# Patient Record
Sex: Male | Born: 1935
Health system: Southern US, Community
[De-identification: ages and names within clinical notes are randomized; demographics above are authoritative.]

## PROBLEM LIST (undated history)

## (undated) DIAGNOSIS — E079 Disorder of thyroid, unspecified: Secondary | ICD-10-CM

## (undated) DIAGNOSIS — I10 Essential (primary) hypertension: Secondary | ICD-10-CM

## (undated) DIAGNOSIS — G629 Polyneuropathy, unspecified: Secondary | ICD-10-CM

## (undated) DIAGNOSIS — M810 Age-related osteoporosis without current pathological fracture: Secondary | ICD-10-CM

## (undated) DIAGNOSIS — N189 Chronic kidney disease, unspecified: Secondary | ICD-10-CM

## (undated) DIAGNOSIS — R001 Bradycardia, unspecified: Secondary | ICD-10-CM

## (undated) DIAGNOSIS — K219 Gastro-esophageal reflux disease without esophagitis: Secondary | ICD-10-CM

## (undated) DIAGNOSIS — E785 Hyperlipidemia, unspecified: Secondary | ICD-10-CM

## (undated) HISTORY — PX: PARATHYROIDECTOMY: SHX19

## (undated) HISTORY — DX: Hyperlipidemia, unspecified: E78.5

## (undated) HISTORY — DX: Age-related osteoporosis without current pathological fracture: M81.0

## (undated) HISTORY — DX: Essential (primary) hypertension: I10

## (undated) HISTORY — DX: Bradycardia, unspecified: R00.1

## (undated) HISTORY — DX: Chronic kidney disease, unspecified: N18.9

## (undated) HISTORY — DX: Polyneuropathy, unspecified: G62.9

## (undated) HISTORY — DX: Disorder of thyroid, unspecified: E07.9

## (undated) HISTORY — DX: Gastro-esophageal reflux disease without esophagitis: K21.9

## (undated) HISTORY — PX: HERNIA REPAIR: SHX51

---

## 2007-03-08 ENCOUNTER — Ambulatory Visit: Payer: Self-pay | Admitting: Gastroenterology

## 2007-04-20 ENCOUNTER — Ambulatory Visit: Payer: Self-pay | Admitting: Gastroenterology

## 2008-08-16 ENCOUNTER — Emergency Department: Payer: Self-pay

## 2012-02-16 DIAGNOSIS — N183 Chronic kidney disease, stage 3 unspecified: Secondary | ICD-10-CM | POA: Insufficient documentation

## 2012-02-16 DIAGNOSIS — Z125 Encounter for screening for malignant neoplasm of prostate: Secondary | ICD-10-CM | POA: Diagnosis not present

## 2012-02-16 DIAGNOSIS — E785 Hyperlipidemia, unspecified: Secondary | ICD-10-CM | POA: Diagnosis not present

## 2012-02-16 DIAGNOSIS — K219 Gastro-esophageal reflux disease without esophagitis: Secondary | ICD-10-CM | POA: Diagnosis not present

## 2012-02-16 DIAGNOSIS — Z0000 Encounter for general adult medical examination without abnormal findings: Secondary | ICD-10-CM | POA: Diagnosis not present

## 2012-02-26 DIAGNOSIS — R55 Syncope and collapse: Secondary | ICD-10-CM | POA: Diagnosis not present

## 2012-02-26 DIAGNOSIS — S0180XA Unspecified open wound of other part of head, initial encounter: Secondary | ICD-10-CM | POA: Diagnosis not present

## 2012-03-07 DIAGNOSIS — K219 Gastro-esophageal reflux disease without esophagitis: Secondary | ICD-10-CM | POA: Diagnosis not present

## 2012-03-07 DIAGNOSIS — K229 Disease of esophagus, unspecified: Secondary | ICD-10-CM | POA: Diagnosis not present

## 2012-03-20 DIAGNOSIS — R1310 Dysphagia, unspecified: Secondary | ICD-10-CM | POA: Diagnosis not present

## 2012-03-20 DIAGNOSIS — K222 Esophageal obstruction: Secondary | ICD-10-CM | POA: Diagnosis not present

## 2012-04-03 DIAGNOSIS — K222 Esophageal obstruction: Secondary | ICD-10-CM | POA: Diagnosis not present

## 2012-04-03 DIAGNOSIS — R1310 Dysphagia, unspecified: Secondary | ICD-10-CM | POA: Diagnosis not present

## 2012-04-03 DIAGNOSIS — K219 Gastro-esophageal reflux disease without esophagitis: Secondary | ICD-10-CM | POA: Diagnosis not present

## 2012-08-21 DIAGNOSIS — R030 Elevated blood-pressure reading, without diagnosis of hypertension: Secondary | ICD-10-CM | POA: Diagnosis not present

## 2012-08-21 DIAGNOSIS — R072 Precordial pain: Secondary | ICD-10-CM | POA: Diagnosis not present

## 2012-08-22 DIAGNOSIS — Z23 Encounter for immunization: Secondary | ICD-10-CM | POA: Diagnosis not present

## 2012-09-11 DIAGNOSIS — I10 Essential (primary) hypertension: Secondary | ICD-10-CM | POA: Diagnosis not present

## 2012-09-18 DIAGNOSIS — R7989 Other specified abnormal findings of blood chemistry: Secondary | ICD-10-CM | POA: Diagnosis not present

## 2012-09-21 DIAGNOSIS — E213 Hyperparathyroidism, unspecified: Secondary | ICD-10-CM | POA: Diagnosis not present

## 2012-10-03 DIAGNOSIS — E210 Primary hyperparathyroidism: Secondary | ICD-10-CM | POA: Diagnosis not present

## 2012-10-27 DIAGNOSIS — E210 Primary hyperparathyroidism: Secondary | ICD-10-CM | POA: Diagnosis not present

## 2012-10-27 DIAGNOSIS — D351 Benign neoplasm of parathyroid gland: Secondary | ICD-10-CM | POA: Diagnosis not present

## 2012-10-27 DIAGNOSIS — E213 Hyperparathyroidism, unspecified: Secondary | ICD-10-CM | POA: Diagnosis not present

## 2012-10-27 DIAGNOSIS — I10 Essential (primary) hypertension: Secondary | ICD-10-CM | POA: Diagnosis not present

## 2012-10-27 DIAGNOSIS — Z01810 Encounter for preprocedural cardiovascular examination: Secondary | ICD-10-CM | POA: Diagnosis not present

## 2012-11-03 DIAGNOSIS — E210 Primary hyperparathyroidism: Secondary | ICD-10-CM | POA: Diagnosis not present

## 2012-11-06 DIAGNOSIS — E210 Primary hyperparathyroidism: Secondary | ICD-10-CM | POA: Diagnosis not present

## 2012-11-06 DIAGNOSIS — M899 Disorder of bone, unspecified: Secondary | ICD-10-CM | POA: Diagnosis not present

## 2012-11-06 DIAGNOSIS — Z79899 Other long term (current) drug therapy: Secondary | ICD-10-CM | POA: Diagnosis not present

## 2012-11-06 DIAGNOSIS — D351 Benign neoplasm of parathyroid gland: Secondary | ICD-10-CM | POA: Diagnosis not present

## 2012-11-06 DIAGNOSIS — G609 Hereditary and idiopathic neuropathy, unspecified: Secondary | ICD-10-CM | POA: Diagnosis not present

## 2012-11-06 DIAGNOSIS — K219 Gastro-esophageal reflux disease without esophagitis: Secondary | ICD-10-CM | POA: Diagnosis not present

## 2012-11-06 DIAGNOSIS — N189 Chronic kidney disease, unspecified: Secondary | ICD-10-CM | POA: Diagnosis not present

## 2012-11-06 DIAGNOSIS — I129 Hypertensive chronic kidney disease with stage 1 through stage 4 chronic kidney disease, or unspecified chronic kidney disease: Secondary | ICD-10-CM | POA: Diagnosis not present

## 2012-11-06 DIAGNOSIS — M810 Age-related osteoporosis without current pathological fracture: Secondary | ICD-10-CM | POA: Diagnosis not present

## 2012-11-06 DIAGNOSIS — Z7982 Long term (current) use of aspirin: Secondary | ICD-10-CM | POA: Diagnosis not present

## 2012-12-01 DIAGNOSIS — E213 Hyperparathyroidism, unspecified: Secondary | ICD-10-CM | POA: Diagnosis not present

## 2012-12-05 DIAGNOSIS — E213 Hyperparathyroidism, unspecified: Secondary | ICD-10-CM | POA: Diagnosis not present

## 2012-12-05 DIAGNOSIS — M810 Age-related osteoporosis without current pathological fracture: Secondary | ICD-10-CM | POA: Diagnosis not present

## 2012-12-06 DIAGNOSIS — I10 Essential (primary) hypertension: Secondary | ICD-10-CM | POA: Diagnosis not present

## 2013-02-21 DIAGNOSIS — Z0000 Encounter for general adult medical examination without abnormal findings: Secondary | ICD-10-CM | POA: Diagnosis not present

## 2013-02-21 DIAGNOSIS — I10 Essential (primary) hypertension: Secondary | ICD-10-CM | POA: Diagnosis not present

## 2013-02-21 DIAGNOSIS — E785 Hyperlipidemia, unspecified: Secondary | ICD-10-CM | POA: Diagnosis not present

## 2013-03-30 DIAGNOSIS — E039 Hypothyroidism, unspecified: Secondary | ICD-10-CM | POA: Diagnosis not present

## 2013-03-30 DIAGNOSIS — R7989 Other specified abnormal findings of blood chemistry: Secondary | ICD-10-CM | POA: Diagnosis not present

## 2013-05-14 DIAGNOSIS — R944 Abnormal results of kidney function studies: Secondary | ICD-10-CM | POA: Diagnosis not present

## 2013-07-20 DIAGNOSIS — L02619 Cutaneous abscess of unspecified foot: Secondary | ICD-10-CM | POA: Diagnosis not present

## 2013-07-24 DIAGNOSIS — B351 Tinea unguium: Secondary | ICD-10-CM | POA: Diagnosis not present

## 2013-09-13 DIAGNOSIS — H35369 Drusen (degenerative) of macula, unspecified eye: Secondary | ICD-10-CM | POA: Diagnosis not present

## 2013-09-13 DIAGNOSIS — Z961 Presence of intraocular lens: Secondary | ICD-10-CM | POA: Diagnosis not present

## 2013-10-09 DIAGNOSIS — Z23 Encounter for immunization: Secondary | ICD-10-CM | POA: Diagnosis not present

## 2013-10-09 DIAGNOSIS — K5289 Other specified noninfective gastroenteritis and colitis: Secondary | ICD-10-CM | POA: Diagnosis not present

## 2013-10-09 DIAGNOSIS — K219 Gastro-esophageal reflux disease without esophagitis: Secondary | ICD-10-CM | POA: Diagnosis not present

## 2013-10-09 DIAGNOSIS — I10 Essential (primary) hypertension: Secondary | ICD-10-CM | POA: Diagnosis not present

## 2013-12-11 DIAGNOSIS — E559 Vitamin D deficiency, unspecified: Secondary | ICD-10-CM | POA: Diagnosis not present

## 2014-03-07 DIAGNOSIS — K219 Gastro-esophageal reflux disease without esophagitis: Secondary | ICD-10-CM | POA: Diagnosis not present

## 2014-03-07 DIAGNOSIS — Z125 Encounter for screening for malignant neoplasm of prostate: Secondary | ICD-10-CM | POA: Diagnosis not present

## 2014-03-07 DIAGNOSIS — Z0000 Encounter for general adult medical examination without abnormal findings: Secondary | ICD-10-CM | POA: Diagnosis not present

## 2014-03-07 DIAGNOSIS — E214 Other specified disorders of parathyroid gland: Secondary | ICD-10-CM | POA: Diagnosis not present

## 2014-03-07 DIAGNOSIS — I10 Essential (primary) hypertension: Secondary | ICD-10-CM | POA: Diagnosis not present

## 2014-09-12 DIAGNOSIS — Z961 Presence of intraocular lens: Secondary | ICD-10-CM | POA: Diagnosis not present

## 2014-10-10 DIAGNOSIS — E210 Primary hyperparathyroidism: Secondary | ICD-10-CM | POA: Diagnosis not present

## 2014-10-10 DIAGNOSIS — Z23 Encounter for immunization: Secondary | ICD-10-CM | POA: Diagnosis not present

## 2014-10-10 DIAGNOSIS — R001 Bradycardia, unspecified: Secondary | ICD-10-CM | POA: Diagnosis not present

## 2014-10-10 DIAGNOSIS — I10 Essential (primary) hypertension: Secondary | ICD-10-CM | POA: Diagnosis not present

## 2014-11-07 DIAGNOSIS — I10 Essential (primary) hypertension: Secondary | ICD-10-CM | POA: Diagnosis not present

## 2014-11-07 DIAGNOSIS — G64 Other disorders of peripheral nervous system: Secondary | ICD-10-CM | POA: Diagnosis not present

## 2014-11-07 DIAGNOSIS — N183 Chronic kidney disease, stage 3 (moderate): Secondary | ICD-10-CM | POA: Diagnosis not present

## 2014-11-07 DIAGNOSIS — F341 Dysthymic disorder: Secondary | ICD-10-CM | POA: Diagnosis not present

## 2014-11-07 DIAGNOSIS — R001 Bradycardia, unspecified: Secondary | ICD-10-CM | POA: Diagnosis not present

## 2014-11-07 DIAGNOSIS — E210 Primary hyperparathyroidism: Secondary | ICD-10-CM | POA: Diagnosis not present

## 2014-11-07 DIAGNOSIS — M810 Age-related osteoporosis without current pathological fracture: Secondary | ICD-10-CM | POA: Diagnosis not present

## 2014-11-27 DIAGNOSIS — R001 Bradycardia, unspecified: Secondary | ICD-10-CM | POA: Diagnosis not present

## 2014-12-02 DIAGNOSIS — H2511 Age-related nuclear cataract, right eye: Secondary | ICD-10-CM | POA: Diagnosis not present

## 2014-12-12 DIAGNOSIS — M810 Age-related osteoporosis without current pathological fracture: Secondary | ICD-10-CM | POA: Diagnosis not present

## 2014-12-17 DIAGNOSIS — M810 Age-related osteoporosis without current pathological fracture: Secondary | ICD-10-CM | POA: Diagnosis not present

## 2014-12-17 DIAGNOSIS — Z8639 Personal history of other endocrine, nutritional and metabolic disease: Secondary | ICD-10-CM | POA: Diagnosis not present

## 2014-12-24 ENCOUNTER — Ambulatory Visit (INDEPENDENT_AMBULATORY_CARE_PROVIDER_SITE_OTHER): Payer: Medicare Other | Admitting: Cardiovascular Disease

## 2014-12-24 ENCOUNTER — Encounter: Payer: Self-pay | Admitting: Cardiovascular Disease

## 2014-12-24 VITALS — BP 140/70 | HR 68 | Ht 68.0 in | Wt 170.2 lb

## 2014-12-24 DIAGNOSIS — R55 Syncope and collapse: Secondary | ICD-10-CM | POA: Insufficient documentation

## 2014-12-24 DIAGNOSIS — K5901 Slow transit constipation: Secondary | ICD-10-CM | POA: Insufficient documentation

## 2014-12-24 DIAGNOSIS — N182 Chronic kidney disease, stage 2 (mild): Secondary | ICD-10-CM | POA: Diagnosis not present

## 2014-12-24 DIAGNOSIS — R001 Bradycardia, unspecified: Secondary | ICD-10-CM | POA: Diagnosis not present

## 2014-12-24 DIAGNOSIS — N189 Chronic kidney disease, unspecified: Secondary | ICD-10-CM | POA: Insufficient documentation

## 2014-12-24 DIAGNOSIS — K219 Gastro-esophageal reflux disease without esophagitis: Secondary | ICD-10-CM

## 2014-12-24 NOTE — Assessment & Plan Note (Signed)
It appears he is having asymptomatic bradycardia. Heart rate 60s on EKG today and this is the average heart rate on recent Holter. As he is asymptomatic, no indication for pacemaker at this time. Heart rate also likely lower during priods of sleep and when napping. With exertion, heart rate typically will increase. He has been monitoring his heart rate at home and typically this is in the 60s. Suggested he contact our office if he has symptoms of near syncope or syncope not associated with his GI symptoms.

## 2014-12-24 NOTE — Assessment & Plan Note (Signed)
Recommended he start an alternating regimen of me relax and milk of magnesia given severe constipation episodes, then with near syncope during bowel movements likely secondary to vasovagal etiology. If symptoms persist, recommended more fluid hydration on a regular basis, and compression hose during bowel movements

## 2014-12-24 NOTE — Progress Notes (Signed)
Patient ID: Vincent Koch, male    DOB: 1933/12/29, 81 y.o.   MRN: [id number]  HPI Comments: Vincent Koch is a pleasant 81 year old gentleman with history of GERD, esophageal stretching, constipation, presenting for evaluation of bradycardia.  He reports that on recent evaluation by Dr. Jeananne Rama he was noticed to have low heart rate on EKG. This EKG shows normal sinus rhythm with rate in the low 50s. He was asymptomatic at the time  Holter monitor was ordered. This showed periods of bradycardia. Entire Holter not available at the time of his visit. Cover page suggest he had heart rate 42 bpm at 1:30 4 in the afternoon. Unsure if he was sleeping at this time.  Average heart rate 69 bpm, maximum 88 eats per minute. Lowest heart rate 46 bpm at 11:11 AM Rare APC and PVC He reports that he was asymptomatic through this 24-hour period and did not appreciate any lightheadedness, dizziness. In general he is active, does regular activities with his wife. Numerous hobbies  His biggest issue is occasional choking. He reports severe episode recently over Thanksgiving, food got stuck for quite some time and he had a difficult time getting the food to move. Since then he has not had any further evaluation, no esophageal stretching. He has had 2-3 stretching's in the past. Currently food is moving okay but he is trying to be careful.  Other major issue is constipation. Typically will go without bowel movement for 3 days then has severe abdominal pain, feels dizzy, sweaty when going to the bathroom, able to move his bowels but sometimes feels like he is going to pass out, followed by diarrhea. Then has weakness for 1 day following these bowel movement episodes. He is not on any stool softeners  EKG on today's visit shows normal sinus rhythm with rate 68 bpm, no significant ST or T-wave changes   No Known Allergies  Outpatient Encounter Prescriptions as of 79/12/2014  Medication Sig  . alendronate  (FOSAMAX) 70 MG tablet Take 70 mg by mouth once a week.   Marland Kitchen aspirin EC 81 MG tablet Take 81 mg by mouth once.   . fexofenadine (ALLEGRA) 180 MG tablet Take 180 mg by mouth daily.   . Multiple Vitamin (MULTI-VITAMINS) TABS Take by mouth daily.     Past Medical History  Diagnosis Date  . Hypertension   . Peripheral neuropathy   . Osteoporosis   . Bradycardia   . GERD (gastroesophageal reflux disease)     Past Surgical History  Procedure Laterality Date  . Parathyroidectomy      Social History  reports that he has never smoked. He does not have any smokeless tobacco history on file. He reports that he does not drink alcohol or use illicit drugs.  Family History Family history is unknown by patient.   Review of Systems  Constitutional: Negative.   Respiratory: Negative.   Cardiovascular: Negative.   Gastrointestinal: Positive for constipation.       Gerd  Musculoskeletal: Negative.   Neurological: Negative.   Hematological: Negative.   Psychiatric/Behavioral: Negative.   All other systems reviewed and are negative.   BP 140/70 mmHg  Pulse 68  Ht 5\' 8"  (1.727 m)  Wt 170 lb 4 oz (77.225 kg)  BMI 25.89 kg/m2  Physical Exam  Constitutional: He is oriented to person, place, and time. He appears well-developed and well-nourished.  HENT:  Head: Normocephalic.  Nose: Nose normal.  Mouth/Throat: Oropharynx is clear and moist.  Eyes: Conjunctivae are  normal. Pupils are equal, round, and reactive to light.  Neck: Normal range of motion. Neck supple. No JVD present.  Cardiovascular: Normal rate, regular rhythm, S1 normal, S2 normal, normal heart sounds and intact distal pulses.  Exam reveals no gallop and no friction rub.   No murmur heard. Pulmonary/Chest: Effort normal and breath sounds normal. No respiratory distress. He has no wheezes. He has no rales. He exhibits no tenderness.  Abdominal: Soft. Bowel sounds are normal. He exhibits no distension. There is no tenderness.   Musculoskeletal: Normal range of motion. He exhibits no edema or tenderness.  Lymphadenopathy:    He has no cervical adenopathy.  Neurological: He is alert and oriented to person, place, and time. Coordination normal.  Skin: Skin is warm and dry. No rash noted. No erythema.  Psychiatric: He has a normal mood and affect. His behavior is normal. Judgment and thought content normal.      Assessment and Plan   Nursing note and vitals reviewed.

## 2014-12-24 NOTE — Patient Instructions (Signed)
You are doing well. No medication changes were made.  Consider omeprazole daily (up to twice a day) Or consider the generic pepcid or zantac as needed  For bowels, Take miralex alternating with MOM Also add colace (softener) and senna (motility), More fluids  Please call if you have symptoms of lightheadedness, dizzy, We would order a 30 day monitor  Please call us if you have new issues that need to be addressed before your next appt.

## 2014-12-24 NOTE — Assessment & Plan Note (Signed)
He has frequent symptoms of near syncope, likely vasovagal. Each episode associated with abdominal pain and bowel movements, constipation followed by diarrhea. Recommended he start a regular laxative regiment to prevent these episodes from happening in the first place.

## 2014-12-24 NOTE — Assessment & Plan Note (Signed)
He is taking Prilosec when necessary for GERD symptoms. Recommended he take this on a daily basis given his history of esophageal strictures in GERD symptoms.

## 2014-12-24 NOTE — Assessment & Plan Note (Signed)
Most recent creatinine 1.35. Unclear if this is secondary to dehydration or his baseline creatinine. Recommended he increase his fluid intake

## 2015-01-22 DIAGNOSIS — J069 Acute upper respiratory infection, unspecified: Secondary | ICD-10-CM | POA: Diagnosis not present

## 2015-01-22 DIAGNOSIS — J029 Acute pharyngitis, unspecified: Secondary | ICD-10-CM | POA: Diagnosis not present

## 2015-03-10 DIAGNOSIS — R350 Frequency of micturition: Secondary | ICD-10-CM | POA: Diagnosis not present

## 2015-03-10 DIAGNOSIS — Z0000 Encounter for general adult medical examination without abnormal findings: Secondary | ICD-10-CM | POA: Diagnosis not present

## 2015-03-10 DIAGNOSIS — N183 Chronic kidney disease, stage 3 (moderate): Secondary | ICD-10-CM | POA: Diagnosis not present

## 2015-03-10 DIAGNOSIS — I10 Essential (primary) hypertension: Secondary | ICD-10-CM | POA: Diagnosis not present

## 2015-03-10 DIAGNOSIS — E210 Primary hyperparathyroidism: Secondary | ICD-10-CM | POA: Diagnosis not present

## 2015-03-11 DIAGNOSIS — M810 Age-related osteoporosis without current pathological fracture: Secondary | ICD-10-CM | POA: Diagnosis not present

## 2015-03-14 DIAGNOSIS — M810 Age-related osteoporosis without current pathological fracture: Secondary | ICD-10-CM | POA: Diagnosis not present

## 2015-03-25 DIAGNOSIS — Z8639 Personal history of other endocrine, nutritional and metabolic disease: Secondary | ICD-10-CM | POA: Diagnosis not present

## 2015-03-25 DIAGNOSIS — M810 Age-related osteoporosis without current pathological fracture: Secondary | ICD-10-CM | POA: Diagnosis not present

## 2015-04-07 DIAGNOSIS — I10 Essential (primary) hypertension: Secondary | ICD-10-CM | POA: Diagnosis not present

## 2015-04-07 DIAGNOSIS — R7301 Impaired fasting glucose: Secondary | ICD-10-CM | POA: Diagnosis not present

## 2015-04-07 DIAGNOSIS — N183 Chronic kidney disease, stage 3 (moderate): Secondary | ICD-10-CM | POA: Diagnosis not present

## 2015-08-05 ENCOUNTER — Other Ambulatory Visit: Payer: Self-pay | Admitting: Unknown Physician Specialty

## 2015-08-05 DIAGNOSIS — N183 Chronic kidney disease, stage 3 unspecified: Secondary | ICD-10-CM

## 2015-08-05 NOTE — Telephone Encounter (Signed)
I reviewed last BMP and then back to 2013; stage 3 CKD for years; one month approved

## 2015-09-11 ENCOUNTER — Other Ambulatory Visit: Payer: Self-pay | Admitting: Family Medicine

## 2015-09-12 NOTE — Telephone Encounter (Signed)
Needs an appointment. Will get him enough medicine to make it to appointment when it's booked.   

## 2015-09-12 NOTE — Telephone Encounter (Signed)
Forwarding to primary 

## 2015-09-15 ENCOUNTER — Telehealth: Payer: Self-pay

## 2015-09-15 MED ORDER — LISINOPRIL 10 MG PO TABS: 10.0000 mg | ORAL_TABLET | Freq: Every day | ORAL | Status: DC

## 2015-09-15 NOTE — Telephone Encounter (Signed)
Called and left patient a voicemail asking for him to please return my call to schedule a follow-up visit.

## 2015-09-15 NOTE — Telephone Encounter (Signed)
appt scheduled oct 31

## 2015-09-15 NOTE — Telephone Encounter (Signed)
Patient must have returned call and spoke to [place]. Scheduled appointment for 09/22/15.

## 2015-09-15 NOTE — Telephone Encounter (Signed)
Fax from pharmacy requesting Lisinopril 10 mg refill- [facility] [address]

## 2015-09-22 ENCOUNTER — Encounter: Payer: Self-pay | Admitting: Family Medicine

## 2015-09-22 ENCOUNTER — Ambulatory Visit (INDEPENDENT_AMBULATORY_CARE_PROVIDER_SITE_OTHER): Payer: Medicare Other | Admitting: Family Medicine

## 2015-09-22 VITALS — BP 115/66 | HR 52 | Temp 97.6°F | Ht 68.0 in | Wt 166.0 lb

## 2015-09-22 DIAGNOSIS — I10 Essential (primary) hypertension: Secondary | ICD-10-CM

## 2015-09-22 DIAGNOSIS — M810 Age-related osteoporosis without current pathological fracture: Secondary | ICD-10-CM | POA: Diagnosis not present

## 2015-09-22 DIAGNOSIS — Z23 Encounter for immunization: Secondary | ICD-10-CM

## 2015-09-22 MED ORDER — IBANDRONATE SODIUM 150 MG PO TABS: 150.0000 mg | ORAL_TABLET | ORAL | Status: DC

## 2015-09-22 MED ORDER — LISINOPRIL 10 MG PO TABS: 10.0000 mg | ORAL_TABLET | Freq: Every day | ORAL | Status: DC

## 2015-09-22 NOTE — Assessment & Plan Note (Signed)
The current medical regimen is effective;  continue present plan and medications.  

## 2015-09-22 NOTE — Progress Notes (Signed)
   BP 115/66 mmHg  Pulse 52  Temp(Src) 97.6 F (36.4 C)  Ht 5\' 8"  (1.727 m)  Wt 166 lb (75.297 kg)  BMI 25.25 kg/m2  SpO2 97%   Subjective:    Patient ID: Vincent Koch, male    DOB: 02-23-1934, 81 y.o.   MRN: [id number]  HPI: Vincent Koch is a 81 y.o. male  Chief Complaint  Patient presents with  . Hypertension   blood pressure doing well no complaints from lisinopril 10 mg takes medicines faithfully Taking Boniva also for osteoporosis no issues Takes once a month no GI complaints or problems.  Relevant past medical, surgical, family and social history reviewed and updated as indicated. Interim medical history since our last visit reviewed. Allergies and medications reviewed and updated.  Review of Systems  Constitutional: Negative.   Respiratory: Negative.   Cardiovascular: Negative.     Per HPI unless specifically indicated above     Objective:    BP 115/66 mmHg  Pulse 52  Temp(Src) 97.6 F (36.4 C)  Ht 5\' 8"  (1.727 m)  Wt 166 lb (75.297 kg)  BMI 25.25 kg/m2  SpO2 97%  Wt Readings from Last 3 Encounters:  09/22/15 166 lb (75.297 kg)  04/07/15 173 lb (78.472 kg)  12/24/14 170 lb 4 oz (77.225 kg)    Physical Exam  Constitutional: He is oriented to person, place, and time. He appears well-developed and well-nourished. No distress.  HENT:  Head: Normocephalic and atraumatic.  Right Ear: Hearing normal.  Left Ear: Hearing normal.  Nose: Nose normal.  Eyes: Conjunctivae and lids are normal. Right eye exhibits no discharge. Left eye exhibits no discharge. No scleral icterus.  Cardiovascular: Normal rate, regular rhythm and normal heart sounds.   Pulmonary/Chest: Effort normal and breath sounds normal. No respiratory distress.  Musculoskeletal: Normal range of motion.  Neurological: He is alert and oriented to person, place, and time.  Skin: Skin is intact. No rash noted.  Psychiatric: He has a normal mood and affect. His speech is normal and  behavior is normal. Judgment and thought content normal. Cognition and memory are normal.    No results found for this or any previous visit.    Assessment & Plan:   Problem List Items Addressed This Visit      Cardiovascular and Mediastinum   Hypertension    The current medical regimen is effective;  continue present plan and medications.       Relevant Medications   lisinopril (PRINIVIL,ZESTRIL) 10 MG tablet   Other Relevant Orders   Basic metabolic panel     Musculoskeletal and Integument   Osteoporosis    The current medical regimen is effective;  continue present plan and medications.       Relevant Medications   ibandronate (BONIVA) 150 MG tablet    Other Visit Diagnoses    Immunization due    -  Primary    Relevant Orders    Flu Vaccine QUAD 36+ mos PF IM (Fluarix & Fluzone Quad PF) (Completed)        Follow up plan: Return in about 6 months (around 03/21/2016) for Physical Exam.

## 2015-09-23 ENCOUNTER — Encounter: Payer: Self-pay | Admitting: Family Medicine

## 2015-09-23 LAB — BASIC METABOLIC PANEL
BUN / CREAT RATIO: 11 (ref 10–22)
CREATININE: 1.32 mg/dL — AB (ref 0.76–1.27)
GFR calc Af Amer: 59 mL/min/{1.73_m2} — ABNORMAL LOW (ref >59)
GFR calc non Af Amer: 51 mL/min/{1.73_m2} — ABNORMAL LOW (ref >59)

## 2015-10-24 ENCOUNTER — Encounter: Payer: Self-pay | Admitting: Unknown Physician Specialty

## 2015-10-24 ENCOUNTER — Ambulatory Visit (INDEPENDENT_AMBULATORY_CARE_PROVIDER_SITE_OTHER): Payer: Medicare Other | Admitting: Unknown Physician Specialty

## 2015-10-24 VITALS — BP 137/80 | HR 64 | Temp 97.8°F | Ht 68.6 in | Wt 169.4 lb

## 2015-10-24 DIAGNOSIS — H8110 Benign paroxysmal vertigo, unspecified ear: Secondary | ICD-10-CM

## 2015-10-24 DIAGNOSIS — J069 Acute upper respiratory infection, unspecified: Secondary | ICD-10-CM | POA: Diagnosis not present

## 2015-10-24 MED ORDER — MECLIZINE HCL 32 MG PO TABS: 32.0000 mg | ORAL_TABLET | Freq: Three times a day (TID) | ORAL | Status: DC | PRN

## 2015-10-24 NOTE — Progress Notes (Signed)
   BP 137/80 mmHg  Pulse 64  Temp(Src) 97.8 F (36.6 C)  Ht 5' 8.6" (1.742 m)  Wt 169 lb 6.4 oz (76.839 kg)  BMI 25.32 kg/m2  SpO2 97%   Subjective:    Patient ID: Vincent Koch, male    DOB: 1934/08/20, 81 y.o.   MRN: [id number]  HPI: Vincent Koch is a 81 y.o. male  Chief Complaint  Patient presents with  . Dizziness    pt states he has been dizzy, light headed, weak, and tired for the last 5 to 6 days.   Dizziness This is a new problem. The current episode started in the past 7 days. The problem occurs intermittently. The problem has been gradually improving. Associated symptoms include congestion, coughing and weakness. Pertinent negatives include no sore throat. The symptoms are aggravated by bending (First episode happened after bending over picking up somethong).     Relevant past medical, surgical, family and social history reviewed and updated as indicated. Interim medical history since our last visit reviewed. Allergies and medications reviewed and updated.  Review of Systems  HENT: Positive for congestion. Negative for sore throat.   Respiratory: Positive for cough.   Neurological: Positive for dizziness and weakness.    Per HPI unless specifically indicated above     Objective:    BP 137/80 mmHg  Pulse 64  Temp(Src) 97.8 F (36.6 C)  Ht 5' 8.6" (1.742 m)  Wt 169 lb 6.4 oz (76.839 kg)  BMI 25.32 kg/m2  SpO2 97%  Wt Readings from Last 3 Encounters:  10/24/15 169 lb 6.4 oz (76.839 kg)  09/22/15 166 lb (75.297 kg)  04/07/15 173 lb (78.472 kg)    Physical Exam  Constitutional: He is oriented to person, place, and time. He appears well-developed and well-nourished. No distress.  HENT:  Head: Normocephalic and atraumatic.  Right Ear: Tympanic membrane and ear canal normal.  Left Ear: Tympanic membrane and ear canal normal.  Nose: Rhinorrhea present. No sinus tenderness.  Mouth/Throat: Posterior oropharyngeal erythema present.  Eyes:  Conjunctivae and lids are normal. Right eye exhibits no discharge. Left eye exhibits no discharge. No scleral icterus.  Cardiovascular: Normal rate.   Pulmonary/Chest: Effort normal.  Abdominal: Normal appearance. There is no splenomegaly or hepatomegaly.  Musculoskeletal: Normal range of motion.  Neurological: He is alert and oriented to person, place, and time.  Skin: Skin is intact. No rash noted. No pallor.  Psychiatric: He has a normal mood and affect. His behavior is normal. Judgment and thought content normal.    Results for orders placed or performed in visit on [id number]  Basic metabolic panel  Result Value Ref Range   Creatinine, Ser 1.32 (H) 0.76 - 1.27 mg/dL   GFR calc non Af Amer 51 (L) >59 mL/min/1.73   GFR calc Af Amer 59 (L) >59 mL/min/1.73   BUN/Creatinine Ratio 11 10 - 22      Assessment & Plan:   Problem List Items Addressed This Visit    None    Visit Diagnoses    URI (upper respiratory infection)    -  Primary    supportive care    BPV (benign positional vertigo), unspecified laterality            Follow up plan: Return in about 3 months (around 01/22/2016).

## 2015-10-24 NOTE — Patient Instructions (Signed)
Benign Positional Vertigo Vertigo is the feeling that you or your surroundings are moving when they are not. Benign positional vertigo is the most common form of vertigo. The cause of this condition is not serious (is benign). This condition is triggered by certain movements and positions (is positional). This condition can be dangerous if it occurs while you are doing something that could endanger you or others, such as driving.  CAUSES In many cases, the cause of this condition is not known. It may be caused by a disturbance in an area of the inner ear that helps your brain to sense movement and balance. This disturbance can be caused by a viral infection (labyrinthitis), head injury, or repetitive motion. RISK FACTORS This condition is more likely to develop in:  Women.  People who are 50 years of age or older. SYMPTOMS Symptoms of this condition usually happen when you move your head or your eyes in different directions. Symptoms may start suddenly, and they usually last for less than a minute. Symptoms may include:  Loss of balance and falling.  Feeling like you are spinning or moving.  Feeling like your surroundings are spinning or moving.  Nausea and vomiting.  Blurred vision.  Dizziness.  Involuntary eye movement (nystagmus). Symptoms can be mild and cause only slight annoyance, or they can be severe and interfere with daily life. Episodes of benign positional vertigo may return (recur) over time, and they may be triggered by certain movements. Symptoms may improve over time. DIAGNOSIS This condition is usually diagnosed by medical history and a physical exam of the head, neck, and ears. You may be referred to a health care provider who specializes in ear, nose, and throat (ENT) problems (otolaryngologist) or a provider who specializes in disorders of the nervous system (neurologist). You may have additional testing, including:  MRI.  A CT scan.  Eye movement tests. Your  health care provider may ask you to change positions quickly while he or she watches you for symptoms of benign positional vertigo, such as nystagmus. Eye movement may be tested with an electronystagmogram (ENG), caloric stimulation, the Dix-Hallpike test, or the roll test.  An electroencephalogram (EEG). This records electrical activity in your brain.  Hearing tests. TREATMENT Usually, your health care provider will treat this by moving your head in specific positions to adjust your inner ear back to normal. Surgery may be needed in severe cases, but this is rare. In some cases, benign positional vertigo may resolve on its own in 2-4 weeks. HOME CARE INSTRUCTIONS Safety  Move slowly.Avoid sudden body or head movements.  Avoid driving.  Avoid operating heavy machinery.  Avoid doing any tasks that would be dangerous to you or others if a vertigo episode would occur.  If you have trouble walking or keeping your balance, try using a cane for stability. If you feel dizzy or unstable, sit down right away.  Return to your normal activities as told by your health care provider. Ask your health care provider what activities are safe for you. General Instructions  Take over-the-counter and prescription medicines only as told by your health care provider.  Avoid certain positions or movements as told by your health care provider.  Drink enough fluid to keep your urine clear or pale yellow.  Keep all follow-up visits as told by your health care provider. This is important. SEEK MEDICAL CARE IF:  You have a fever.  Your condition gets worse or you develop new symptoms.  Your family or friends   notice any behavioral changes.  Your nausea or vomiting gets worse.  You have numbness or a "pins and needles" sensation. SEEK IMMEDIATE MEDICAL CARE IF:  You have difficulty speaking or moving.  You are always dizzy.  You faint.  You develop severe headaches.  You have weakness in your  legs or arms.  You have changes in your hearing or vision.  You develop a stiff neck.  You develop sensitivity to light.   This information is not intended to replace advice given to you by your health care provider. Make sure you discuss any questions you have with your health care provider.   Document Released: 08/16/2006 Document Revised: 07/30/2015 Document Reviewed: 03/03/2015 Elsevier Interactive Patient Education 2016 [employer]  

## 2015-12-12 DIAGNOSIS — Z961 Presence of intraocular lens: Secondary | ICD-10-CM | POA: Diagnosis not present

## 2016-03-10 ENCOUNTER — Encounter: Payer: Medicare Other | Admitting: Family Medicine

## 2016-03-23 DIAGNOSIS — M810 Age-related osteoporosis without current pathological fracture: Secondary | ICD-10-CM | POA: Diagnosis not present

## 2016-03-25 DIAGNOSIS — M810 Age-related osteoporosis without current pathological fracture: Secondary | ICD-10-CM | POA: Diagnosis not present

## 2016-03-30 DIAGNOSIS — Z8639 Personal history of other endocrine, nutritional and metabolic disease: Secondary | ICD-10-CM | POA: Diagnosis not present

## 2016-03-30 DIAGNOSIS — M818 Other osteoporosis without current pathological fracture: Secondary | ICD-10-CM | POA: Diagnosis not present

## 2016-03-30 DIAGNOSIS — E892 Postprocedural hypoparathyroidism: Secondary | ICD-10-CM | POA: Diagnosis not present

## 2016-05-19 ENCOUNTER — Telehealth: Payer: Self-pay

## 2016-05-19 DIAGNOSIS — R1310 Dysphagia, unspecified: Secondary | ICD-10-CM

## 2016-05-19 NOTE — Telephone Encounter (Signed)
Referral in

## 2016-05-19 NOTE — Telephone Encounter (Signed)
Patient having choking issues, was seen by Dr. Allen Norris for upper endoscopy in 2008.  New referral??

## 2016-06-10 ENCOUNTER — Encounter: Payer: Self-pay | Admitting: Gastroenterology

## 2016-06-10 ENCOUNTER — Encounter: Payer: Self-pay | Admitting: *Deleted

## 2016-06-10 ENCOUNTER — Other Ambulatory Visit: Payer: Self-pay

## 2016-06-10 ENCOUNTER — Ambulatory Visit (INDEPENDENT_AMBULATORY_CARE_PROVIDER_SITE_OTHER): Payer: Medicare Other | Admitting: Gastroenterology

## 2016-06-10 VITALS — BP 123/63 | HR 53 | Temp 97.7°F | Ht 70.0 in | Wt 165.0 lb

## 2016-06-10 DIAGNOSIS — R1310 Dysphagia, unspecified: Secondary | ICD-10-CM

## 2016-06-10 MED ORDER — PANTOPRAZOLE SODIUM 40 MG PO TBEC: 40.0000 mg | DELAYED_RELEASE_TABLET | Freq: Every day | ORAL | Status: DC

## 2016-06-10 NOTE — Progress Notes (Signed)
Gastroenterology Consultation  Referring Provider:     Guadalupe Maple, MD Primary Care Physician:  Golden Pop, MD Primary Gastroenterologist:  Dr. Allen Norris     Reason for Consultation:     Dysphagia        HPI:   Vincent Koch is a 82 y.o. y/o male referred for consultation & management of Dysphagia by Dr. Golden Pop, MD.  This patient comes in today with a history of dysphagia. The patient states that he has had dysphagia in the past and had a upper endoscopy with dilation by me back in 2008. Since then the patient had another dilation by another gastroenterologist. The patient now states that he was having trouble with solids. The patient was given some Nexium and states that it was helping him that he was swallowing better. The patient had been dilated with a 15 mm balloon neck in 2008 and then a 18 mm balloon subsequently. The patient denies any black stools or bloody stools. The patient also denies any nausea vomiting. He denies any unexplained weight loss.  Past Medical History  Diagnosis Date  . Peripheral neuropathy ([place])   . Osteoporosis   . Bradycardia   . GERD (gastroesophageal reflux disease)   . Hypertension   . Thyroid disease   . Chronic kidney disease     Past Surgical History  Procedure Laterality Date  . Parathyroidectomy    . Hernia repair      Prior to Admission medications   Medication Sig Start Date End Date Taking? Authorizing Provider  aspirin EC 81 MG tablet Take 81 mg by mouth once.  11/06/12  Yes Historical Provider, MD  famotidine (PEPCID) 20 MG tablet Take 20 mg by mouth 2 (two) times daily.   Yes Historical Provider, MD  fexofenadine (ALLEGRA) 180 MG tablet Take 180 mg by mouth daily.    Yes Historical Provider, MD  ibandronate (BONIVA) 150 MG tablet Take 1 tablet (150 mg total) by mouth every 30 (thirty) days. 09/22/15  Yes Guadalupe Maple, MD  lisinopril (PRINIVIL,ZESTRIL) 10 MG tablet Take 1 tablet (10 mg total) by mouth daily. 09/22/15   Yes Guadalupe Maple, MD  Multiple Vitamin (MULTI-VITAMINS) TABS Take by mouth daily.    Yes Historical Provider, MD  vitamin C (ASCORBIC ACID) 500 MG tablet Take 500 mg by mouth daily.   Yes Historical Provider, MD  meclizine (ANTIVERT) 32 MG tablet Take 1 tablet (32 mg total) by mouth 3 (three) times daily as needed. Patient not taking: Reported on 06/10/2016 10/24/15   Kathrine Haddock, NP  pantoprazole (PROTONIX) 40 MG tablet Take 1 tablet (40 mg total) by mouth daily. 06/10/16   Lucilla Lame, MD    Family History  Problem Relation Age of Onset  . Stroke Mother   . Cancer Father      Social History  Substance Use Topics  . Smoking status: Never Smoker   . Smokeless tobacco: Never Used  . Alcohol Use: No    Allergies as of 06/10/2016  . (No Known Allergies)    Review of Systems:    All systems reviewed and negative except where noted in HPI.   Physical Exam:  BP 123/63 mmHg  Pulse 53  Temp(Src) 97.7 F (36.5 C) (Oral)  Ht 5\' 10"  (1.778 m)  Wt 165 lb (74.844 kg)  BMI 23.68 kg/m2 No LMP for male patient. Psych:  Alert and cooperative. Normal mood and affect. General:   Alert,  Well-developed, well-nourished, pleasant and  cooperative in NAD Head:  Normocephalic and atraumatic. Eyes:  Sclera clear, no icterus.   Conjunctiva pink. Ears:  Normal auditory acuity. Nose:  No deformity, discharge, or lesions. Mouth:  No deformity or lesions,oropharynx pink & moist. Neck:  Supple; no masses or thyromegaly. Lungs:  Respirations even and unlabored.  Clear throughout to auscultation.   No wheezes, crackles, or rhonchi. No acute distress. Heart:  Regular rate and rhythm; no murmurs, clicks, rubs, or gallops. Abdomen:  Normal bowel sounds.  No bruits.  Soft, non-tender and non-distended without masses, hepatosplenomegaly or hernias noted.  No guarding or rebound tenderness.  Negative Carnett sign.   Rectal:  Deferred.  Msk:  Symmetrical without gross deformities.  Good, equal movement &  strength bilaterally. Pulses:  Normal pulses noted. Extremities:  No clubbing or edema.  No cyanosis. Neurologic:  Alert and oriented x3;  grossly normal neurologically. Skin:  Intact without significant lesions or rashes.  No jaundice. Lymph Nodes:  No significant cervical adenopathy. Psych:  Alert and cooperative. Normal mood and affect.  Imaging Studies: No results found.  Assessment and Plan:   Vincent Koch is a 82 y.o. y/o male who comes in with dysphagia. The patient is a history of esophageal strictures. The patient will be set up for a upper endoscopy. The patient will also be put on Protonix 40 mg per day.I have discussed risks & benefits which include, but are not limited to, bleeding, infection, perforation & drug reaction.  The patient agrees with this plan & written consent will be obtained.      Note: This dictation was prepared with Dragon dictation along with smaller phrase technology. Any transcriptional errors that result from this process are unintentional.

## 2016-06-11 NOTE — Discharge Instructions (Signed)

## 2016-06-14 ENCOUNTER — Encounter
Admission: RE | Disposition: A | Discharge status: Home / Self Care | Payer: Self-pay | Source: Ambulatory Visit | Attending: Gastroenterology

## 2016-06-14 ENCOUNTER — Ambulatory Visit: Payer: Medicare Other | Admitting: Anesthesiology

## 2016-06-14 ENCOUNTER — Ambulatory Visit
Admission: RE | Admit: 2016-06-14 | Discharge: 2016-06-14 | Disposition: A | Discharge status: Home / Self Care | Payer: Medicare Other | Source: Ambulatory Visit | Attending: Gastroenterology | Admitting: Gastroenterology

## 2016-06-14 DIAGNOSIS — E892 Postprocedural hypoparathyroidism: Secondary | ICD-10-CM | POA: Diagnosis not present

## 2016-06-14 DIAGNOSIS — Z79899 Other long term (current) drug therapy: Secondary | ICD-10-CM | POA: Insufficient documentation

## 2016-06-14 DIAGNOSIS — K222 Esophageal obstruction: Secondary | ICD-10-CM | POA: Insufficient documentation

## 2016-06-14 DIAGNOSIS — K449 Diaphragmatic hernia without obstruction or gangrene: Secondary | ICD-10-CM | POA: Diagnosis not present

## 2016-06-14 DIAGNOSIS — N189 Chronic kidney disease, unspecified: Secondary | ICD-10-CM | POA: Insufficient documentation

## 2016-06-14 DIAGNOSIS — Z823 Family history of stroke: Secondary | ICD-10-CM | POA: Insufficient documentation

## 2016-06-14 DIAGNOSIS — K219 Gastro-esophageal reflux disease without esophagitis: Secondary | ICD-10-CM | POA: Insufficient documentation

## 2016-06-14 DIAGNOSIS — R1310 Dysphagia, unspecified: Secondary | ICD-10-CM | POA: Insufficient documentation

## 2016-06-14 DIAGNOSIS — Z8489 Family history of other specified conditions: Secondary | ICD-10-CM | POA: Diagnosis not present

## 2016-06-14 DIAGNOSIS — I129 Hypertensive chronic kidney disease with stage 1 through stage 4 chronic kidney disease, or unspecified chronic kidney disease: Secondary | ICD-10-CM | POA: Insufficient documentation

## 2016-06-14 DIAGNOSIS — G629 Polyneuropathy, unspecified: Secondary | ICD-10-CM | POA: Diagnosis not present

## 2016-06-14 DIAGNOSIS — Z7982 Long term (current) use of aspirin: Secondary | ICD-10-CM | POA: Insufficient documentation

## 2016-06-14 DIAGNOSIS — M810 Age-related osteoporosis without current pathological fracture: Secondary | ICD-10-CM | POA: Insufficient documentation

## 2016-06-14 HISTORY — PX: ESOPHAGEAL DILATION: SHX303

## 2016-06-14 HISTORY — DX: Polyneuropathy, unspecified: G62.9

## 2016-06-14 SURGERY — DILATION, ESOPHAGUS
Anesthesia: Monitor Anesthesia Care | Wound class: Clean Contaminated

## 2016-06-14 MED ORDER — LIDOCAINE HCL (CARDIAC) 20 MG/ML IV SOLN
INTRAVENOUS | Status: DC | PRN
  Administered 2016-06-14: 40 mg via INTRAVENOUS

## 2016-06-14 MED ORDER — GLYCOPYRROLATE 0.2 MG/ML IJ SOLN
INTRAMUSCULAR | Status: DC | PRN
  Administered 2016-06-14: 0.2 mg via INTRAVENOUS

## 2016-06-14 MED ORDER — LACTATED RINGERS IV SOLN
INTRAVENOUS | Status: DC
  Administered 2016-06-14: 11:00:00 via INTRAVENOUS

## 2016-06-14 MED ORDER — PROPOFOL 10 MG/ML IV BOLUS
INTRAVENOUS | Status: DC | PRN
  Administered 2016-06-14 (×3): 50 mg via INTRAVENOUS

## 2016-06-14 SURGICAL SUPPLY — 32 items
BALLN DILATOR 10-12 8 (BALLOONS)
BALLN DILATOR 12-15 8 (BALLOONS) ×3
BALLN DILATOR 15-18 8 (BALLOONS)
BALLN DILATOR CRE 0-12 8 (BALLOONS)
BALLN DILATOR ESOPH 8 10 CRE (MISCELLANEOUS) IMPLANT
BALLOON DILATOR 12-15 8 (BALLOONS) ×1 IMPLANT
BALLOON DILATOR 15-18 8 (BALLOONS) IMPLANT
BALLOON DILATOR CRE 0-12 8 (BALLOONS) IMPLANT
BLOCK BITE 60FR ADLT L/F GRN (MISCELLANEOUS) ×3 IMPLANT
CANISTER SUCT 1200ML W/VALVE (MISCELLANEOUS) ×3 IMPLANT
CLIP HMST 235XBRD CATH ROT (MISCELLANEOUS) IMPLANT
CLIP RESOLUTION 360 11X235 (MISCELLANEOUS)
FCP ESCP3.2XJMB 240X2.8X (MISCELLANEOUS)
FORCEPS BIOP RAD 4 LRG CAP 4 (CUTTING FORCEPS) IMPLANT
FORCEPS BIOP RJ4 240 W/NDL (MISCELLANEOUS)
FORCEPS ESCP3.2XJMB 240X2.8X (MISCELLANEOUS) IMPLANT
GOWN CVR UNV OPN BCK APRN NK (MISCELLANEOUS) ×2 IMPLANT
GOWN ISOL THUMB LOOP REG UNIV (MISCELLANEOUS) ×4
INJECTOR VARIJECT VIN23 (MISCELLANEOUS) IMPLANT
KIT DEFENDO VALVE AND CONN (KITS) IMPLANT
KIT ENDO PROCEDURE OLY (KITS) ×3 IMPLANT
MARKER SPOT ENDO TATTOO 5ML (MISCELLANEOUS) IMPLANT
PAD GROUND ADULT SPLIT (MISCELLANEOUS) IMPLANT
RETRIEVER NET PLAT FOOD (MISCELLANEOUS) IMPLANT
SNARE SHORT THROW 13M SML OVAL (MISCELLANEOUS) IMPLANT
SNARE SHORT THROW 30M LRG OVAL (MISCELLANEOUS) IMPLANT
SPOT EX ENDOSCOPIC TATTOO (MISCELLANEOUS)
SYR INFLATION 60ML (SYRINGE) ×3 IMPLANT
TRAP ETRAP POLY (MISCELLANEOUS) IMPLANT
VARIJECT INJECTOR VIN23 (MISCELLANEOUS)
WATER STERILE IRR 250ML POUR (IV SOLUTION) ×3 IMPLANT
WIRE CRE 18-20MM 8CM F G (MISCELLANEOUS) IMPLANT

## 2016-06-14 NOTE — Anesthesia Postprocedure Evaluation (Signed)
Anesthesia Post Note  Patient: Vincent Koch  Procedure(s) Performed: Procedure(s) (LRB): ESOPHAGOGASTRODUODENOSCOPY (EGD) WITH PROPOFOL with dilation (N/A)  Patient location during evaluation: PACU Anesthesia Type: MAC Level of consciousness: awake and alert Pain management: pain level controlled Vital Signs Assessment: post-procedure vital signs reviewed and stable Respiratory status: spontaneous breathing, nonlabored ventilation, respiratory function stable and patient connected to nasal cannula oxygen Cardiovascular status: stable and blood pressure returned to baseline Anesthetic complications: no    Dioselina Brumbaugh C

## 2016-06-14 NOTE — H&P (Signed)
Lucilla Lame, MD [facility] [address], [place], [place] [address] Phone: [phone] Fax : [phone]  Primary Care Physician:  Golden Pop, MD Primary Gastroenterologist:  Dr. Allen Norris  Pre-Procedure History & Physical: HPI:  Vincent Koch is a 82 y.o. male is here for an endoscopy.   Past Medical History:  Diagnosis Date  . Bradycardia   . Chronic kidney disease   . GERD (gastroesophageal reflux disease)   . Hypertension   . Neuropathy (HCC)    toes  . Osteoporosis   . Peripheral neuropathy ([place])   . Thyroid disease     Past Surgical History:  Procedure Laterality Date  . HERNIA REPAIR    . PARATHYROIDECTOMY      Prior to Admission medications   Medication Sig Start Date End Date Taking? Authorizing Provider  aspirin EC 81 MG tablet Take 81 mg by mouth once.  11/06/12  Yes Historical Provider, MD  famotidine (PEPCID) 20 MG tablet Take 20 mg by mouth 2 (two) times daily.   Yes Historical Provider, MD  fexofenadine (ALLEGRA) 180 MG tablet Take 180 mg by mouth daily.    Yes Historical Provider, MD  ibandronate (BONIVA) 150 MG tablet Take 1 tablet (150 mg total) by mouth every 30 (thirty) days. 09/22/15  Yes Guadalupe Maple, MD  lisinopril (PRINIVIL,ZESTRIL) 10 MG tablet Take 1 tablet (10 mg total) by mouth daily. 09/22/15  Yes Guadalupe Maple, MD  Multiple Vitamin (MULTI-VITAMINS) TABS Take by mouth daily.    Yes Historical Provider, MD  pantoprazole (PROTONIX) 40 MG tablet Take 1 tablet (40 mg total) by mouth daily. 06/10/16  Yes Lucilla Lame, MD  vitamin C (ASCORBIC ACID) 500 MG tablet Take 500 mg by mouth daily.   Yes Historical Provider, MD    Allergies as of 06/10/2016  . (No Known Allergies)    Family History  Problem Relation Age of Onset  . Stroke Mother   . Cancer Father     Social History   Social History  . Marital status: Married    Spouse name: N/A  . Number of children: N/A  . Years of education: N/A   Occupational History  . Not on  file.   Social History Main Topics  . Smoking status: Never Smoker  . Smokeless tobacco: Never Used  . Alcohol use No  . Drug use: No  . Sexual activity: Not on file   Other Topics Concern  . Not on file   Social History Narrative  . No narrative on file    Review of Systems: See HPI, otherwise negative ROS  Physical Exam: BP 131/63   Pulse (!) 53   Temp (!) 96.6 F (35.9 C) (Temporal)   Resp 16   Ht 5\' 10"  (1.778 m)   Wt 163 lb (73.9 kg)   SpO2 100%   BMI 23.39 kg/m  General:   Alert,  pleasant and cooperative in NAD Head:  Normocephalic and atraumatic. Neck:  Supple; no masses or thyromegaly. Lungs:  Clear throughout to auscultation.    Heart:  Regular rate and rhythm. Abdomen:  Soft, nontender and nondistended. Normal bowel sounds, without guarding, and without rebound.   Neurologic:  Alert and  oriented x4;  grossly normal neurologically.  Impression/Plan: Vincent Koch is here for an endoscopy to be performed for dysphagia  Risks, benefits, limitations, and alternatives regarding  endoscopy have been reviewed with the patient.  Questions have been answered.  All parties agreeable.   Lucilla Lame,  MD  06/14/2016, 10:44 AM

## 2016-06-14 NOTE — Op Note (Signed)
[facility] Gastroenterology Patient Name: Vincent Koch Procedure Date: 06/14/2016 11:20 AM MRN: [id number] Account #: [id number] Date of Birth: 1934/04/14 Admit Type: Outpatient Age: 82 Room: [facility] OR ROOM 01 Gender: Male Note Status: Finalized Procedure:            Upper GI endoscopy Indications:          Dysphagia Providers:            Lucilla Lame MD, MD Referring MD:         Guadalupe Maple, MD (Referring MD) Medicines:            Propofol per Anesthesia Complications:        No immediate complications. Procedure:            Pre-Anesthesia Assessment:                       - Prior to the procedure, a History and Physical was                        performed, and patient medications and allergies were                        reviewed. The patient's tolerance of previous                        anesthesia was also reviewed. The risks and benefits of                        the procedure and the sedation options and risks were                        discussed with the patient. All questions were                        answered, and informed consent was obtained. Prior                        Anticoagulants: The patient has taken no previous                        anticoagulant or antiplatelet agents. ASA Grade                        Assessment: II - A patient with mild systemic disease.                        After reviewing the risks and benefits, the patient was                        deemed in satisfactory condition to undergo the                        procedure.                       After obtaining informed consent, the endoscope was                        passed under direct vision. Throughout the procedure,  the patient's blood pressure, pulse, and oxygen                        saturations were monitored continuously. The Olympus                        GIF-HQ190 Endoscope (S#. [id number]) was introduced                        through  the mouth, and advanced to the second part of                        duodenum. The upper GI endoscopy was accomplished                        without difficulty. The patient tolerated the procedure                        well. Findings:      A small hiatal hernia was present.      One moderate benign-appearing, intrinsic stenosis was found at the       gastroesophageal junction. This measured 1 cm (inner diameter) and was       traversed. A TTS dilator was passed through the scope. Dilation with a       12-13.5-15 mm balloon dilator was performed to 15 mm. The dilation site       was examined following endoscope reinsertion and showed moderate       improvement in luminal narrowing.      The stomach was normal.      The examined duodenum was normal. Impression:           - Small hiatal hernia.                       - Benign-appearing esophageal stenosis. Dilated.                       - Normal stomach.                       - Normal examined duodenum.                       - No specimens collected. Recommendation:       - Repeat upper endoscopy in 3 weeks for retreatment. Procedure Code(s):    --- Professional ---                       [phone], Esophagogastroduodenoscopy, flexible, transoral;                        with transendoscopic balloon dilation of esophagus                        (less than 30 mm diameter) Diagnosis Code(s):    --- Professional ---                       R13.10, Dysphagia, unspecified                       K22.2, Esophageal obstruction CPT copyright 2016 American Medical Association. All rights reserved. The codes documented  in this report are preliminary and upon coder review may  be revised to meet current compliance requirements. Lucilla Lame MD, MD 06/14/2016 11:40:56 AM This report has been signed electronically. Number of Addenda: 0 Note Initiated On: 06/14/2016 11:20 AM      [facility]

## 2016-06-14 NOTE — Transfer of Care (Signed)
Immediate Anesthesia Transfer of Care Note  Patient: Vincent Koch  Procedure(s) Performed: Procedure(s): ESOPHAGOGASTRODUODENOSCOPY (EGD) WITH PROPOFOL with dialation (N/A)  Patient Location: PACU  Anesthesia Type: MAC  Level of Consciousness: awake, alert  and patient cooperative  Airway and Oxygen Therapy: Patient Spontanous Breathing and Patient connected to supplemental oxygen  Post-op Assessment: Post-op Vital signs reviewed, Patient's Cardiovascular Status Stable, Respiratory Function Stable, Patent Airway and No signs of Nausea or vomiting  Post-op Vital Signs: Reviewed and stable  Complications: No apparent anesthesia complications

## 2016-06-14 NOTE — Anesthesia Preprocedure Evaluation (Signed)
Anesthesia Evaluation  Patient identified by MRN, date of birth, ID band Patient awake    Reviewed: Allergy & Precautions, NPO status , Patient's Chart, lab work & pertinent test results  Airway Mallampati: II  TM Distance: >3 FB Neck ROM: Full    Dental no notable dental hx.    Pulmonary neg pulmonary ROS,    Pulmonary exam normal breath sounds clear to auscultation       Cardiovascular hypertension, Normal cardiovascular exam Rhythm:Regular Rate:Normal  HR in the 50's   Neuro/Psych negative neurological ROS  negative psych ROS   GI/Hepatic Neg liver ROS, GERD  Controlled,  Endo/Other  parathyroidectomy  Renal/GU Renal diseaseMakes urine. No dialysis  negative genitourinary   Musculoskeletal negative musculoskeletal ROS (+)   Abdominal   Peds negative pediatric ROS (+)  Hematology negative hematology ROS (+)   Anesthesia Other Findings   Reproductive/Obstetrics negative OB ROS                             Anesthesia Physical Anesthesia Plan  ASA: II  Anesthesia Plan: MAC   Post-op Pain Management:    Induction: Intravenous  Airway Management Planned:   Additional Equipment:   Intra-op Plan:   Post-operative Plan: Extubation in OR  Informed Consent: I have reviewed the patients History and Physical, chart, labs and discussed the procedure including the risks, benefits and alternatives for the proposed anesthesia with the patient or authorized representative who has indicated his/her understanding and acceptance.   Dental advisory given  Plan Discussed with: CRNA  Anesthesia Plan Comments:         Anesthesia Quick Evaluation

## 2016-06-15 ENCOUNTER — Encounter: Payer: Self-pay | Admitting: Gastroenterology

## 2016-06-17 ENCOUNTER — Other Ambulatory Visit: Payer: Self-pay

## 2016-06-29 ENCOUNTER — Ambulatory Visit (INDEPENDENT_AMBULATORY_CARE_PROVIDER_SITE_OTHER): Payer: Medicare Other | Admitting: Family Medicine

## 2016-06-29 ENCOUNTER — Encounter: Payer: Self-pay | Admitting: Family Medicine

## 2016-06-29 VITALS — BP 121/65 | HR 47 | Temp 97.6°F | Ht 68.5 in | Wt 166.0 lb

## 2016-06-29 DIAGNOSIS — K222 Esophageal obstruction: Secondary | ICD-10-CM

## 2016-06-29 DIAGNOSIS — I10 Essential (primary) hypertension: Secondary | ICD-10-CM | POA: Diagnosis not present

## 2016-06-29 DIAGNOSIS — M810 Age-related osteoporosis without current pathological fracture: Secondary | ICD-10-CM

## 2016-06-29 DIAGNOSIS — N183 Chronic kidney disease, stage 3 unspecified: Secondary | ICD-10-CM

## 2016-06-29 DIAGNOSIS — E785 Hyperlipidemia, unspecified: Secondary | ICD-10-CM | POA: Diagnosis not present

## 2016-06-29 DIAGNOSIS — G609 Hereditary and idiopathic neuropathy, unspecified: Secondary | ICD-10-CM

## 2016-06-29 DIAGNOSIS — G629 Polyneuropathy, unspecified: Secondary | ICD-10-CM | POA: Insufficient documentation

## 2016-06-29 DIAGNOSIS — R5382 Chronic fatigue, unspecified: Secondary | ICD-10-CM | POA: Diagnosis not present

## 2016-06-29 DIAGNOSIS — Z23 Encounter for immunization: Secondary | ICD-10-CM | POA: Diagnosis not present

## 2016-06-29 DIAGNOSIS — Z0000 Encounter for general adult medical examination without abnormal findings: Secondary | ICD-10-CM | POA: Diagnosis not present

## 2016-06-29 LAB — URINALYSIS, ROUTINE W REFLEX MICROSCOPIC
Bilirubin, UA: NEGATIVE
Glucose, UA: NEGATIVE
KETONES UA: NEGATIVE
Nitrite, UA: NEGATIVE
PH UA: 6.5 (ref 5.0–7.5)
PROTEIN UA: NEGATIVE
RBC, UA: NEGATIVE
SPEC GRAV UA: 1.015 (ref 1.005–1.030)
Urobilinogen, Ur: 1 mg/dL (ref 0.2–1.0)

## 2016-06-29 LAB — MICROSCOPIC EXAMINATION: RBC, UA: NONE SEEN /hpf (ref 0–?)

## 2016-06-29 MED ORDER — IBANDRONATE SODIUM 150 MG PO TABS: 150.0000 mg | ORAL_TABLET | ORAL | 12 refills | Status: DC

## 2016-06-29 MED ORDER — LISINOPRIL 10 MG PO TABS
10.0000 mg | ORAL_TABLET | Freq: Every day | ORAL | 12 refills | Status: DC
Start: 2016-06-29 — End: 2017-02-02

## 2016-06-29 NOTE — Patient Instructions (Addendum)
Tdap Vaccine (Tetanus, Diphtheria and Pertussis): What You Need to Know 1. Why get vaccinated? Tetanus, diphtheria and pertussis are very serious diseases. Tdap vaccine can protect us from these diseases. And, Tdap vaccine given to pregnant women can protect newborn babies against pertussis. TETANUS (Lockjaw) is rare in the [place] today. It causes painful muscle tightening and stiffness, usually all over the body.  It can lead to tightening of muscles in the head and neck so you can't open your mouth, swallow, or sometimes even breathe. Tetanus kills about 1 out of 10 people who are infected even after receiving the best medical care. DIPHTHERIA is also rare in the [place] today. It can cause a thick coating to form in the back of the throat.  It can lead to breathing problems, heart failure, paralysis, and death. PERTUSSIS (Whooping Cough) causes severe coughing spells, which can cause difficulty breathing, vomiting and disturbed sleep.  It can also lead to weight loss, incontinence, and rib fractures. Up to 2 in 100 adolescents and 5 in 100 adults with pertussis are hospitalized or have complications, which could include pneumonia or death. These diseases are caused by bacteria. Diphtheria and pertussis are spread from person to person through secretions from coughing or sneezing. Tetanus enters the body through cuts, scratches, or wounds. Before vaccines, as many as 200,000 cases of diphtheria, 200,000 cases of pertussis, and hundreds of cases of tetanus, were reported in the [place] each year. Since vaccination began, reports of cases for tetanus and diphtheria have dropped by about 99% and for pertussis by about 80%. 2. Tdap vaccine Tdap vaccine can protect adolescents and adults from tetanus, diphtheria, and pertussis. One dose of Tdap is routinely given at age 11 or 12. People who did not get Tdap at that age should get it as soon as possible. Tdap is especially important  for healthcare professionals and anyone having close contact with a baby younger than 12 months. Pregnant women should get a dose of Tdap during every pregnancy, to protect the newborn from pertussis. Infants are most at risk for severe, life-threatening complications from pertussis. Another vaccine, called Td, protects against tetanus and diphtheria, but not pertussis. A Td booster should be given every 10 years. Tdap may be given as one of these boosters if you have never gotten Tdap before. Tdap may also be given after a severe cut or burn to prevent tetanus infection. Your doctor or the person giving you the vaccine can give you more information. Tdap may safely be given at the same time as other vaccines. 3. Some people should not get this vaccine  A person who has ever had a life-threatening allergic reaction after a previous dose of any diphtheria, tetanus or pertussis containing vaccine, OR has a severe allergy to any part of this vaccine, should not get Tdap vaccine. Tell the person giving the vaccine about any severe allergies.  Anyone who had coma or long repeated seizures within 7 days after a childhood dose of DTP or DTaP, or a previous dose of Tdap, should not get Tdap, unless a cause other than the vaccine was found. They can still get Td.  Talk to your doctor if you:  have seizures or another nervous system problem,  had severe pain or swelling after any vaccine containing diphtheria, tetanus or pertussis,  ever had a condition called Guillain-Barr Syndrome (GBS),  aren't feeling well on the day the shot is scheduled. 4. Risks With any medicine, including vaccines, there is   a chance of side effects. These are usually mild and go away on their own. Serious reactions are also possible but are rare. Most people who get Tdap vaccine do not have any problems with it. Mild problems following Tdap (Did not interfere with activities)  Pain where the shot was given (about 3 in 4  adolescents or 2 in 3 adults)  Redness or swelling where the shot was given (about 1 person in 5)  Mild fever of at least 100.4F (up to about 1 in 25 adolescents or 1 in 100 adults)  Headache (about 3 or 4 people in 10)  Tiredness (about 1 person in 3 or 4)  Nausea, vomiting, diarrhea, stomach ache (up to 1 in 4 adolescents or 1 in 10 adults)  Chills, sore joints (about 1 person in 10)  Body aches (about 1 person in 3 or 4)  Rash, swollen glands (uncommon) Moderate problems following Tdap (Interfered with activities, but did not require medical attention)  Pain where the shot was given (up to 1 in 5 or 6)  Redness or swelling where the shot was given (up to about 1 in 16 adolescents or 1 in 12 adults)  Fever over 102F (about 1 in 100 adolescents or 1 in 250 adults)  Headache (about 1 in 7 adolescents or 1 in 10 adults)  Nausea, vomiting, diarrhea, stomach ache (up to 1 or 3 people in 100)  Swelling of the entire arm where the shot was given (up to about 1 in 500). Severe problems following Tdap (Unable to perform usual activities; required medical attention)  Swelling, severe pain, bleeding and redness in the arm where the shot was given (rare). Problems that could happen after any vaccine:  People sometimes faint after a medical procedure, including vaccination. Sitting or lying down for about 15 minutes can help prevent fainting, and injuries caused by a fall. Tell your doctor if you feel dizzy, or have vision changes or ringing in the ears.  Some people get severe pain in the shoulder and have difficulty moving the arm where a shot was given. This happens very rarely.  Any medication can cause a severe allergic reaction. Such reactions from a vaccine are very rare, estimated at fewer than 1 in a million doses, and would happen within a few minutes to a few hours after the vaccination. As with any medicine, there is a very remote chance of a vaccine causing a serious  injury or death. The safety of vaccines is always being monitored. For more information, visit: www.cdc.gov/vaccinesafety/ 5. What if there is a serious problem? What should I look for?  Look for anything that concerns you, such as signs of a severe allergic reaction, very high fever, or unusual behavior.  Signs of a severe allergic reaction can include hives, swelling of the face and throat, difficulty breathing, a fast heartbeat, dizziness, and weakness. These would usually start a few minutes to a few hours after the vaccination. What should I do?  If you think it is a severe allergic reaction or other emergency that can't wait, call 9-1-1 or get the person to the nearest hospital. Otherwise, call your doctor.  Afterward, the reaction should be reported to the Vaccine Adverse Event Reporting System (VAERS). Your doctor might file this report, or you can do it yourself through the VAERS web site at www.vaers.hhs.gov, or by calling 1-800-822-7967. VAERS does not give medical advice.  6. The National Vaccine Injury Compensation Program The National Vaccine Injury Compensation Program (  VICP) is a federal program that was created to compensate people who may have been injured by certain vaccines. Persons who believe they may have been injured by a vaccine can learn about the program and about filing a claim by calling 1-800-338-2382 or visiting the VICP website at www.hrsa.gov/vaccinecompensation. There is a time limit to file a claim for compensation. 7. How can I learn more?  Ask your doctor. He or she can give you the vaccine package insert or suggest other sources of information.  Call your local or state health department.  Contact the Centers for Disease Control and Prevention (CDC):  Call 1-800-232-4636 (1-800-CDC-INFO) or  Visit CDC's website at www.cdc.gov/vaccines CDC Tdap Vaccine VIS (01/15/14)   This information is not intended to replace advice given to you by your health care  provider. Make sure you discuss any questions you have with your health care provider.   Document Released: 05/09/2012 Document Revised: 11/29/2014 Document Reviewed: 02/20/2014 Elsevier Interactive Patient Education 2016 Elsevier Inc.  

## 2016-06-29 NOTE — Assessment & Plan Note (Signed)
The current medical regimen is effective;  continue present plan and medications.  

## 2016-06-29 NOTE — Progress Notes (Signed)
BP 121/65 (BP Location: Left Arm, Patient Position: Sitting, Cuff Size: Small)   Pulse (!) 47   Temp 97.6 F (36.4 C)   Ht 5' 8.5" (1.74 m)   Wt 166 lb (75.3 kg)   SpO2 95%   BMI 24.87 kg/m    Subjective:    Patient ID: Vincent Koch, male    DOB: Oct 11, 1934, 82 y.o.   MRN: [id number]  HPI: Vincent Koch is a 82 y.o. male   Annual exam AWV metrics met Blood pressure doing well taking lisinopril without problems good control no side effects and taking medicines faithfully Esophagus stretched and is done well swallowing no issues has been told remain on Protonix 40 mg long-term taking medicines no problems and no esophageal symptoms. Taking Boniva once a month without problems Patient aware of taking with big glass of water staying upright   Relevant past medical, surgical, family and social history reviewed and updated as indicated. Interim medical history since our last visit reviewed. Allergies and medications reviewed and updated.  Review of Systems  Constitutional: Negative.   HENT: Negative.   Eyes: Negative.   Respiratory: Negative.   Cardiovascular: Negative.   Gastrointestinal: Negative.   Endocrine: Negative.   Genitourinary: Negative.   Musculoskeletal: Negative.   Skin: Negative.   Allergic/Immunologic: Negative.   Neurological: Negative.   Hematological: Negative.   Psychiatric/Behavioral: Negative.     Per HPI unless specifically indicated above     Objective:    BP 121/65 (BP Location: Left Arm, Patient Position: Sitting, Cuff Size: Small)   Pulse (!) 47   Temp 97.6 F (36.4 C)   Ht 5' 8.5" (1.74 m)   Wt 166 lb (75.3 kg)   SpO2 95%   BMI 24.87 kg/m   Wt Readings from Last 3 Encounters:  06/29/16 166 lb (75.3 kg)  06/14/16 163 lb (73.9 kg)  06/10/16 165 lb (74.8 kg)    Physical Exam  Constitutional: He is oriented to person, place, and time. He appears well-developed and well-nourished.  HENT:  Head: Normocephalic and  atraumatic.  Right Ear: External ear normal.  Left Ear: External ear normal.  Eyes: Conjunctivae and EOM are normal. Pupils are equal, round, and reactive to light.  Neck: Normal range of motion. Neck supple.  Cardiovascular: Normal rate, regular rhythm, normal heart sounds and intact distal pulses.   Pulmonary/Chest: Effort normal and breath sounds normal.  Abdominal: Soft. Bowel sounds are normal. There is no splenomegaly or hepatomegaly.  Genitourinary: Rectum normal and penis normal.  Genitourinary Comments: Prostate enlarged  Musculoskeletal: Normal range of motion.  Neurological: He is alert and oriented to person, place, and time. He has normal reflexes.  Skin: No rash noted. No erythema.  Psychiatric: He has a normal mood and affect. His behavior is normal. Judgment and thought content normal.    Results for orders placed or performed in visit on 41/93/79  Basic metabolic panel  Result Value Ref Range   Creatinine, Ser 1.32 (H) 0.76 - 1.27 mg/dL   GFR calc non Af Amer 51 (L) >59 mL/min/1.73   GFR calc Af Amer 59 (L) >59 mL/min/1.73   BUN/Creatinine Ratio 11 10 - 22      Assessment & Plan:   Problem List Items Addressed This Visit      Cardiovascular and Mediastinum   Hypertension    The current medical regimen is effective;  continue present plan and medications.       Relevant Medications   lisinopril (  PRINIVIL,ZESTRIL) 10 MG tablet     Digestive   Stricture and stenosis of esophagus    The current medical regimen is effective;  continue present plan and medications.         Nervous and Auditory   Peripheral neuropathy (HCC)    The current medical regimen is effective;  continue present plan and medications.         Musculoskeletal and Integument   Osteoporosis   Relevant Medications   ibandronate (BONIVA) 150 MG tablet     Genitourinary   CKD (chronic kidney disease) stage 3, GFR 30-59 ml/min    The current medical regimen is effective;  continue  present plan and medications.        Other Visit Diagnoses    Need for Tdap vaccination    -  Primary   Relevant Orders   Tdap vaccine greater than or equal to 7yo IM (Completed)   Well adult exam       Relevant Orders   Urinalysis, Routine w reflex microscopic (not at [facility])   Comprehensive metabolic panel   Lipid Panel w/o Chol/HDL Ratio   Chronic fatigue       Relevant Orders   CBC with Differential/Platelet   TSH       Follow up plan: Return in about 6 months (around 12/30/2016) for BMP.

## 2016-06-30 ENCOUNTER — Encounter: Payer: Self-pay | Admitting: Family Medicine

## 2016-06-30 LAB — CBC WITH DIFFERENTIAL/PLATELET
BASOS ABS: 0.1 10*3/uL (ref 0.0–0.2)
Basos: 1 %
EOS (ABSOLUTE): 0.4 10*3/uL (ref 0.0–0.4)
Eos: 5 %
Hematocrit: 42 % (ref 37.5–51.0)
Hemoglobin: 14.1 g/dL (ref 12.6–17.7)
IMMATURE GRANS (ABS): 0 10*3/uL (ref 0.0–0.1)
IMMATURE GRANULOCYTES: 0 %
LYMPHS: 28 %
Lymphocytes Absolute: 2 10*3/uL (ref 0.7–3.1)
MCH: 30.5 pg (ref 26.6–33.0)
MCHC: 33.6 g/dL (ref 31.5–35.7)
MCV: 91 fL (ref 79–97)
MONOS ABS: 0.8 10*3/uL (ref 0.1–0.9)
Monocytes: 11 %
NEUTROS PCT: 55 %
Neutrophils Absolute: 4 10*3/uL (ref 1.4–7.0)
PLATELETS: 206 10*3/uL (ref 150–379)
RBC: 4.63 x10E6/uL (ref 4.14–5.80)
RDW: 14 % (ref 12.3–15.4)
WBC: 7.3 10*3/uL (ref 3.4–10.8)

## 2016-06-30 LAB — COMPREHENSIVE METABOLIC PANEL
ALT: 27 IU/L (ref 0–44)
AST: 27 IU/L (ref 0–40)
Albumin/Globulin Ratio: 1.7 (ref 1.2–2.2)
Albumin: 4.3 g/dL (ref 3.5–4.8)
Alkaline Phosphatase: 64 IU/L (ref 39–117)
BILIRUBIN TOTAL: 0.3 mg/dL (ref 0.0–1.2)
BUN/Creatinine Ratio: 12 (ref 10–24)
BUN: 16 mg/dL (ref 8–27)
CALCIUM: 9.2 mg/dL (ref 8.6–10.2)
CHLORIDE: 100 mmol/L (ref 96–106)
CO2: 23 mmol/L (ref 18–29)
Creatinine, Ser: 1.35 mg/dL — ABNORMAL HIGH (ref 0.76–1.27)
GFR calc non Af Amer: 50 mL/min/{1.73_m2} — ABNORMAL LOW (ref >59)
GFR, EST AFRICAN AMERICAN: 57 mL/min/{1.73_m2} — AB (ref >59)
GLUCOSE: 121 mg/dL — AB (ref 65–99)
Globulin, Total: 2.5 g/dL (ref 1.5–4.5)
POTASSIUM: 4.5 mmol/L (ref 3.5–5.2)
Sodium: 140 mmol/L (ref 134–144)
TOTAL PROTEIN: 6.8 g/dL (ref 6.0–8.5)

## 2016-06-30 LAB — LIPID PANEL W/O CHOL/HDL RATIO
Cholesterol, Total: 201 mg/dL — ABNORMAL HIGH (ref 100–199)
HDL: 34 mg/dL — AB (ref >39)
LDL Calculated: 109 mg/dL — ABNORMAL HIGH (ref 0–99)
TRIGLYCERIDES: 291 mg/dL — AB (ref 0–149)
VLDL Cholesterol Cal: 58 mg/dL — ABNORMAL HIGH (ref 5–40)

## 2016-06-30 LAB — TSH: TSH: 3.67 u[IU]/mL (ref 0.450–4.500)

## 2016-07-08 ENCOUNTER — Encounter: Payer: Self-pay | Admitting: *Deleted

## 2016-07-13 ENCOUNTER — Encounter: Payer: Self-pay | Admitting: Family Medicine

## 2016-07-13 DIAGNOSIS — E785 Hyperlipidemia, unspecified: Secondary | ICD-10-CM | POA: Insufficient documentation

## 2016-07-15 ENCOUNTER — Ambulatory Visit: Payer: Medicare Other | Admitting: Anesthesiology

## 2016-07-15 ENCOUNTER — Encounter
Admission: RE | Disposition: A | Discharge status: Home / Self Care | Payer: Self-pay | Source: Ambulatory Visit | Attending: Gastroenterology

## 2016-07-15 ENCOUNTER — Ambulatory Visit
Admission: RE | Admit: 2016-07-15 | Discharge: 2016-07-15 | Disposition: A | Discharge status: Home / Self Care | Payer: Medicare Other | Source: Ambulatory Visit | Attending: Gastroenterology | Admitting: Gastroenterology

## 2016-07-15 DIAGNOSIS — G629 Polyneuropathy, unspecified: Secondary | ICD-10-CM | POA: Insufficient documentation

## 2016-07-15 DIAGNOSIS — K219 Gastro-esophageal reflux disease without esophagitis: Secondary | ICD-10-CM | POA: Insufficient documentation

## 2016-07-15 DIAGNOSIS — N189 Chronic kidney disease, unspecified: Secondary | ICD-10-CM | POA: Diagnosis not present

## 2016-07-15 DIAGNOSIS — R1310 Dysphagia, unspecified: Secondary | ICD-10-CM | POA: Insufficient documentation

## 2016-07-15 DIAGNOSIS — Z823 Family history of stroke: Secondary | ICD-10-CM | POA: Diagnosis not present

## 2016-07-15 DIAGNOSIS — K222 Esophageal obstruction: Secondary | ICD-10-CM | POA: Insufficient documentation

## 2016-07-15 DIAGNOSIS — E892 Postprocedural hypoparathyroidism: Secondary | ICD-10-CM | POA: Diagnosis not present

## 2016-07-15 DIAGNOSIS — I129 Hypertensive chronic kidney disease with stage 1 through stage 4 chronic kidney disease, or unspecified chronic kidney disease: Secondary | ICD-10-CM | POA: Diagnosis not present

## 2016-07-15 DIAGNOSIS — K449 Diaphragmatic hernia without obstruction or gangrene: Secondary | ICD-10-CM | POA: Diagnosis not present

## 2016-07-15 DIAGNOSIS — Z809 Family history of malignant neoplasm, unspecified: Secondary | ICD-10-CM | POA: Insufficient documentation

## 2016-07-15 DIAGNOSIS — R001 Bradycardia, unspecified: Secondary | ICD-10-CM | POA: Insufficient documentation

## 2016-07-15 DIAGNOSIS — Z7982 Long term (current) use of aspirin: Secondary | ICD-10-CM | POA: Insufficient documentation

## 2016-07-15 DIAGNOSIS — E079 Disorder of thyroid, unspecified: Secondary | ICD-10-CM | POA: Diagnosis not present

## 2016-07-15 DIAGNOSIS — Z79899 Other long term (current) drug therapy: Secondary | ICD-10-CM | POA: Insufficient documentation

## 2016-07-15 DIAGNOSIS — M810 Age-related osteoporosis without current pathological fracture: Secondary | ICD-10-CM | POA: Insufficient documentation

## 2016-07-15 HISTORY — PX: ESOPHAGOGASTRODUODENOSCOPY (EGD) WITH PROPOFOL: SHX5813

## 2016-07-15 SURGERY — ESOPHAGOGASTRODUODENOSCOPY (EGD) WITH PROPOFOL
Anesthesia: Monitor Anesthesia Care | Site: Throat | Wound class: Clean Contaminated

## 2016-07-15 MED ORDER — GLYCOPYRROLATE 0.2 MG/ML IJ SOLN
INTRAMUSCULAR | Status: DC | PRN
  Administered 2016-07-15: 0.2 mg via INTRAVENOUS

## 2016-07-15 MED ORDER — LIDOCAINE HCL (CARDIAC) 20 MG/ML IV SOLN
INTRAVENOUS | Status: DC | PRN
  Administered 2016-07-15: 50 mg via INTRAVENOUS

## 2016-07-15 MED ORDER — PROPOFOL 10 MG/ML IV BOLUS
INTRAVENOUS | Status: DC | PRN
  Administered 2016-07-15: 30 mg via INTRAVENOUS
  Administered 2016-07-15 (×2): 20 mg via INTRAVENOUS
  Administered 2016-07-15: 60 mg via INTRAVENOUS
  Administered 2016-07-15: 20 mg via INTRAVENOUS

## 2016-07-15 MED ORDER — LACTATED RINGERS IV SOLN
INTRAVENOUS | Status: DC | PRN
  Administered 2016-07-15: 10:00:00 via INTRAVENOUS

## 2016-07-15 SURGICAL SUPPLY — 32 items
BALLN DILATOR 10-12 8 (BALLOONS)
BALLN DILATOR 12-15 8 (BALLOONS)
BALLN DILATOR 15-18 8 (BALLOONS) ×3
BALLN DILATOR CRE 0-12 8 (BALLOONS)
BALLN DILATOR ESOPH 8 10 CRE (MISCELLANEOUS) IMPLANT
BALLOON DILATOR 12-15 8 (BALLOONS) IMPLANT
BALLOON DILATOR 15-18 8 (BALLOONS) ×1 IMPLANT
BALLOON DILATOR CRE 0-12 8 (BALLOONS) IMPLANT
BLOCK BITE 60FR ADLT L/F GRN (MISCELLANEOUS) ×3 IMPLANT
CANISTER SUCT 1200ML W/VALVE (MISCELLANEOUS) ×3 IMPLANT
CLIP HMST 235XBRD CATH ROT (MISCELLANEOUS) IMPLANT
CLIP RESOLUTION 360 11X235 (MISCELLANEOUS)
FCP ESCP3.2XJMB 240X2.8X (MISCELLANEOUS)
FORCEPS BIOP RAD 4 LRG CAP 4 (CUTTING FORCEPS) IMPLANT
FORCEPS BIOP RJ4 240 W/NDL (MISCELLANEOUS)
FORCEPS ESCP3.2XJMB 240X2.8X (MISCELLANEOUS) IMPLANT
GOWN CVR UNV OPN BCK APRN NK (MISCELLANEOUS) ×2 IMPLANT
GOWN ISOL THUMB LOOP REG UNIV (MISCELLANEOUS) ×4
INJECTOR VARIJECT VIN23 (MISCELLANEOUS) IMPLANT
KIT DEFENDO VALVE AND CONN (KITS) IMPLANT
KIT ENDO PROCEDURE OLY (KITS) ×3 IMPLANT
MARKER SPOT ENDO TATTOO 5ML (MISCELLANEOUS) IMPLANT
PAD GROUND ADULT SPLIT (MISCELLANEOUS) IMPLANT
RETRIEVER NET PLAT FOOD (MISCELLANEOUS) IMPLANT
SNARE SHORT THROW 13M SML OVAL (MISCELLANEOUS) IMPLANT
SNARE SHORT THROW 30M LRG OVAL (MISCELLANEOUS) IMPLANT
SPOT EX ENDOSCOPIC TATTOO (MISCELLANEOUS)
SYR INFLATION 60ML (SYRINGE) ×3 IMPLANT
TRAP ETRAP POLY (MISCELLANEOUS) IMPLANT
VARIJECT INJECTOR VIN23 (MISCELLANEOUS)
WATER STERILE IRR 250ML POUR (IV SOLUTION) ×3 IMPLANT
WIRE CRE 18-20MM 8CM F G (MISCELLANEOUS) IMPLANT

## 2016-07-15 NOTE — Transfer of Care (Signed)
Immediate Anesthesia Transfer of Care Note  Patient: Vincent Koch  Procedure(s) Performed: Procedure(s): ESOPHAGOGASTRODUODENOSCOPY (EGD) WITH DILATION (N/A)  Patient Location: PACU  Anesthesia Type: MAC  Level of Consciousness: awake, alert  and patient cooperative  Airway and Oxygen Therapy: Patient Spontanous Breathing and Patient connected to supplemental oxygen  Post-op Assessment: Post-op Vital signs reviewed, Patient's Cardiovascular Status Stable, Respiratory Function Stable, Patent Airway and No signs of Nausea or vomiting  Post-op Vital Signs: Reviewed and stable  Complications: No apparent anesthesia complications

## 2016-07-15 NOTE — Anesthesia Preprocedure Evaluation (Signed)
Anesthesia Evaluation    Airway Mallampati: II  TM Distance: >3 FB Neck ROM: Full    Dental no notable dental hx.    Pulmonary neg pulmonary ROS,    Pulmonary exam normal breath sounds clear to auscultation       Cardiovascular hypertension, Normal cardiovascular exam Rhythm:Regular Rate:Normal  bradycardia   Neuro/Psych  Neuromuscular disease    GI/Hepatic GERD  ,  Endo/Other  negative endocrine ROS  Renal/GU Renal disease  negative genitourinary   Musculoskeletal osteoporosis   Abdominal   Peds  Hematology negative hematology ROS (+)   Anesthesia Other Findings   Reproductive/Obstetrics negative OB ROS                             Anesthesia Physical Anesthesia Plan  ASA: II  Anesthesia Plan: MAC   Post-op Pain Management:    Induction: Intravenous  Airway Management Planned:   Additional Equipment:   Intra-op Plan:   Post-operative Plan: Extubation in OR  Informed Consent: I have reviewed the patients History and Physical, chart, labs and discussed the procedure including the risks, benefits and alternatives for the proposed anesthesia with the patient or authorized representative who has indicated his/her understanding and acceptance.   Dental advisory given  Plan Discussed with: CRNA  Anesthesia Plan Comments:         Anesthesia Quick Evaluation

## 2016-07-15 NOTE — H&P (Signed)
Vincent Lame, MD [facility] [address] [place], [place] [address] Phone: [phone] Fax : [phone]  Primary Care Physician:  Golden Pop, MD Primary Gastroenterologist:  Dr. Allen Norris  Pre-Procedure History & Physical: HPI:  Vincent Koch is a 82 y.o. male is here for an endoscopy.   Past Medical History:  Diagnosis Date  . Bradycardia   . Chronic kidney disease   . GERD (gastroesophageal reflux disease)   . Hypertension   . Neuropathy (HCC)    toes  . Osteoporosis   . Peripheral neuropathy ([place])   . Thyroid disease     Past Surgical History:  Procedure Laterality Date  . ESOPHAGEAL DILATION N/A 06/14/2016   Procedure: ESOPHAGOGASTRODUODENOSCOPY (EGD) WITH PROPOFOL with dilation;  Surgeon: Vincent Lame, MD;  Location: [place];  Service: Endoscopy;  Laterality: N/A;  . HERNIA REPAIR    . PARATHYROIDECTOMY      Prior to Admission medications   Medication Sig Start Date End Date Taking? Authorizing Provider  aspirin EC 81 MG tablet Take 81 mg by mouth once.  11/06/12  Yes Historical Provider, MD  fexofenadine (ALLEGRA) 180 MG tablet Take 180 mg by mouth as needed for allergies.    Yes Historical Provider, MD  Ginkgo Biloba (GINKOBA PO) Take 1 tablet by mouth daily.    Yes Historical Provider, MD  ibandronate (BONIVA) 150 MG tablet Take 1 tablet (150 mg total) by mouth every 30 (thirty) days. 06/29/16  Yes Guadalupe Maple, MD  lisinopril (PRINIVIL,ZESTRIL) 10 MG tablet Take 1 tablet (10 mg total) by mouth daily. 06/29/16  Yes Guadalupe Maple, MD  Multiple Vitamin (MULTI-VITAMINS) TABS Take by mouth daily.    Yes Historical Provider, MD  Multiple Vitamins-Minerals (MULTIVITAMIN GUMMIES ADULTS PO) Take 1 tablet by mouth daily.   Yes Historical Provider, MD  pantoprazole (PROTONIX) 40 MG tablet Take 1 tablet (40 mg total) by mouth daily. 06/10/16  Yes Vincent Lame, MD  vitamin C (ASCORBIC ACID) 500 MG tablet Take 500 mg by mouth daily.    Historical Provider, MD      Allergies as of 06/17/2016  . (No Known Allergies)    Family History  Problem Relation Age of Onset  . Stroke Mother   . Cancer Father     Social History   Social History  . Marital status: Married    Spouse name: N/A  . Number of children: N/A  . Years of education: N/A   Occupational History  . Not on file.   Social History Main Topics  . Smoking status: Never Smoker  . Smokeless tobacco: Never Used  . Alcohol use No  . Drug use: No  . Sexual activity: Not on file   Other Topics Concern  . Not on file   Social History Narrative  . No narrative on file    Review of Systems: See HPI, otherwise negative ROS  Physical Exam: BP 123/77   Pulse (!) 55   Temp 97.1 F (36.2 C) (Temporal)   Resp 16   Ht 5' 8.5" (1.74 m)   Wt 165 lb (74.8 kg)   SpO2 94%   BMI 24.72 kg/m  General:   Alert,  pleasant and cooperative in NAD Head:  Normocephalic and atraumatic. Neck:  Supple; no masses or thyromegaly. Lungs:  Clear throughout to auscultation.    Heart:  Regular rate and rhythm. Abdomen:  Soft, nontender and nondistended. Normal bowel sounds, without guarding, and without rebound.   Neurologic:  Alert and  oriented x4;  grossly normal neurologically.  Impression/Plan: [name] is here for an endoscopy to be performed for Dysphagia  Risks, benefits, limitations, and alternatives regarding  endoscopy have been reviewed with the patient.  Questions have been answered.  All parties agreeable.   Vincent Lame, MD  07/15/2016, 9:35 AM

## 2016-07-15 NOTE — Discharge Instructions (Signed)

## 2016-07-15 NOTE — Anesthesia Postprocedure Evaluation (Signed)
Anesthesia Post Note  Patient: Vincent Koch  Procedure(s) Performed: Procedure(s) (LRB): ESOPHAGOGASTRODUODENOSCOPY (EGD) WITH DILATION (N/A)  Patient location during evaluation: PACU Anesthesia Type: MAC Level of consciousness: awake and alert Pain management: pain level controlled Vital Signs Assessment: post-procedure vital signs reviewed and stable Respiratory status: spontaneous breathing, nonlabored ventilation, respiratory function stable and patient connected to nasal cannula oxygen Cardiovascular status: stable and blood pressure returned to baseline Anesthetic complications: no    Euel Castile C

## 2016-07-15 NOTE — Op Note (Signed)
[facility] Gastroenterology Patient Name: Vincent Koch Procedure Date: 07/15/2016 10:09 AM MRN: [id number] Account #: [id number] Date of Birth: [birth year] Admit Type: Outpatient Age: 82 Room: [facility] OR ROOM 01 Gender: Male Note Status: Finalized Procedure:            Upper GI endoscopy Indications:          Dysphagia Providers:            Lucilla Lame MD, MD Referring MD:         Guadalupe Maple, MD (Referring MD) Medicines:            Propofol per Anesthesia Complications:        No immediate complications. Procedure:            Pre-Anesthesia Assessment:                       - Prior to the procedure, a History and Physical was                        performed, and patient medications and allergies were                        reviewed. The patient's tolerance of previous                        anesthesia was also reviewed. The risks and benefits of                        the procedure and the sedation options and risks were                        discussed with the patient. All questions were                        answered, and informed consent was obtained. Prior                        Anticoagulants: The patient has taken no previous                        anticoagulant or antiplatelet agents. ASA Grade                        Assessment: II - A patient with mild systemic disease.                        After reviewing the risks and benefits, the patient was                        deemed in satisfactory condition to undergo the                        procedure.                       After obtaining informed consent, the endoscope was                        passed under direct vision. Throughout the procedure,  the patient's blood pressure, pulse, and oxygen                        saturations were monitored continuously. The Olympus                        GIF-HQ190 Endoscope (S#. [phone]) was introduced                        through  the mouth, and advanced to the second part of                        duodenum. The upper GI endoscopy was accomplished                        without difficulty. The patient tolerated the procedure                        well. Findings:      One moderate benign-appearing, intrinsic stenosis was found at the       gastroesophageal junction. And was traversed. A TTS dilator was passed       through the scope. Dilation with a 15-16.5-18 mm balloon dilator was       performed to 18 mm. The dilation site was examined following endoscope       reinsertion and showed complete resolution of luminal narrowing.      A small hiatal hernia was present.      The stomach was normal.      The examined duodenum was normal. Impression:           - Benign-appearing esophageal stenosis. Dilated.                       - Small hiatal hernia.                       - Normal stomach.                       - Normal examined duodenum.                       - No specimens collected. Recommendation:       - Resume previous diet.                       - Continue present medications.                       - Repeat upper endoscopy PRN for retreatment. Procedure Code(s):    --- Professional ---                       [phone], Esophagogastroduodenoscopy, flexible, transoral;                        with transendoscopic balloon dilation of esophagus                        (less than 30 mm diameter) Diagnosis Code(s):    --- Professional ---                       R13.10, Dysphagia,  unspecified                       K22.2, Esophageal obstruction CPT copyright 2016 American Medical Association. All rights reserved. The codes documented in this report are preliminary and upon coder review may  be revised to meet current compliance requirements. Lucilla Lame MD, MD 07/15/2016 10:29:10 AM This report has been signed electronically. Number of Addenda: 0 Note Initiated On: 07/15/2016 10:09 AM Total Procedure Duration: 0 hours 4  minutes 55 seconds       [facility]

## 2016-07-15 NOTE — Anesthesia Procedure Notes (Signed)
Procedure Name: MAC Performed by: Omesha Bowerman Pre-anesthesia Checklist: Patient identified, Emergency Drugs available, Suction available, Timeout performed and Patient being monitored Patient Re-evaluated:Patient Re-evaluated prior to inductionOxygen Delivery Method: Nasal cannula Placement Confirmation: positive ETCO2       

## 2016-07-16 ENCOUNTER — Encounter: Payer: Self-pay | Admitting: Gastroenterology

## 2016-10-28 ENCOUNTER — Other Ambulatory Visit: Payer: Self-pay | Admitting: Family Medicine

## 2016-10-28 MED ORDER — AZITHROMYCIN 250 MG PO TABS: ORAL_TABLET | ORAL | 0 refills | Status: DC

## 2016-10-28 NOTE — Progress Notes (Signed)
Phone call Discussed with patient primary caregiver for wife his had replacement surgery having bad sinusitis infection symptoms going on for a week or so with some low-grade fever no chest symptoms will call in a Z-Pak if not better will need to see.

## 2016-12-14 DIAGNOSIS — M818 Other osteoporosis without current pathological fracture: Secondary | ICD-10-CM | POA: Diagnosis not present

## 2016-12-14 DIAGNOSIS — M810 Age-related osteoporosis without current pathological fracture: Secondary | ICD-10-CM | POA: Diagnosis not present

## 2016-12-28 DIAGNOSIS — M818 Other osteoporosis without current pathological fracture: Secondary | ICD-10-CM | POA: Diagnosis not present

## 2016-12-28 DIAGNOSIS — E892 Postprocedural hypoparathyroidism: Secondary | ICD-10-CM | POA: Diagnosis not present

## 2016-12-28 DIAGNOSIS — Z8639 Personal history of other endocrine, nutritional and metabolic disease: Secondary | ICD-10-CM | POA: Diagnosis not present

## 2017-01-03 ENCOUNTER — Ambulatory Visit: Payer: Medicare Other | Admitting: Family Medicine

## 2017-01-14 ENCOUNTER — Other Ambulatory Visit: Payer: Self-pay | Admitting: Gastroenterology

## 2017-02-02 ENCOUNTER — Other Ambulatory Visit: Payer: Self-pay

## 2017-02-02 DIAGNOSIS — I10 Essential (primary) hypertension: Secondary | ICD-10-CM

## 2017-02-02 NOTE — Telephone Encounter (Signed)
He's overdue for an appointment. He needs to make an appointment

## 2017-02-02 NOTE — Telephone Encounter (Signed)
Pt requesting 90 day supply

## 2017-02-02 NOTE — Telephone Encounter (Signed)
[facility] OV: 06/29/16 Next OV: None on file. No showed on 01/03/17  BMP Latest Ref Rng & Units 06/29/2016 09/22/2015  Glucose 65 - 99 mg/dL 121(H) -  BUN 8 - 27 mg/dL 16 -  Creatinine 0.76 - 1.27 mg/dL 1.35(H) 1.32(H)  BUN/Creat Ratio 10 - 24 12 11   Sodium 134 - 144 mmol/L 140 -  Potassium 3.5 - 5.2 mmol/L 4.5 -  Chloride 96 - 106 mmol/L 100 -  CO2 18 - 29 mmol/L 23 -  Calcium 8.6 - 10.2 mg/dL 9.2 -

## 2017-02-02 NOTE — Telephone Encounter (Signed)
Please schedule pt a f/u w/ Dr. Jeananne Rama

## 2017-02-07 MED ORDER — LISINOPRIL 10 MG PO TABS: 10.0000 mg | ORAL_TABLET | Freq: Every day | ORAL | 1 refills | Status: DC

## 2017-02-14 NOTE — Telephone Encounter (Signed)
Patient scheduled appt 03/21/2017

## 2017-03-21 ENCOUNTER — Encounter: Payer: Self-pay | Admitting: Family Medicine

## 2017-03-21 ENCOUNTER — Ambulatory Visit (INDEPENDENT_AMBULATORY_CARE_PROVIDER_SITE_OTHER): Payer: Medicare Other | Admitting: Family Medicine

## 2017-03-21 VITALS — BP 109/65 | HR 66 | Temp 97.8°F | Wt 170.0 lb

## 2017-03-21 DIAGNOSIS — I10 Essential (primary) hypertension: Secondary | ICD-10-CM

## 2017-03-21 DIAGNOSIS — K222 Esophageal obstruction: Secondary | ICD-10-CM | POA: Diagnosis not present

## 2017-03-21 DIAGNOSIS — E785 Hyperlipidemia, unspecified: Secondary | ICD-10-CM

## 2017-03-21 MED ORDER — LISINOPRIL 10 MG PO TABS: 10.0000 mg | ORAL_TABLET | Freq: Every day | ORAL | 4 refills | Status: DC

## 2017-03-21 NOTE — Progress Notes (Signed)
BP 109/65   Pulse 66   Temp 97.8 F (36.6 C)   Wt 170 lb (77.1 kg)   SpO2 99%   BMI 25.47 kg/m    Subjective:    Patient ID: Vincent Koch, male    DOB: 07-24-1934, 83 y.o.   MRN: [id number]  HPI: Vincent Koch is a 83 y.o. male  Chief Complaint  Patient presents with  . Hypertension    no concerns today   Follow-up esophageal dilation doing well taking reflux medicines without problems and not having any more dysphagia symptoms. Blood pressure good control no issues with lisinopril. Relevant past medical, surgical, family and social history reviewed and updated as indicated. Interim medical history since our last visit reviewed. Allergies and medications reviewed and updated.  Review of Systems  Constitutional: Negative.   Respiratory: Negative.   Cardiovascular: Negative.     Per HPI unless specifically indicated above     Objective:    BP 109/65   Pulse 66   Temp 97.8 F (36.6 C)   Wt 170 lb (77.1 kg)   SpO2 99%   BMI 25.47 kg/m   Wt Readings from Last 3 Encounters:  03/21/17 170 lb (77.1 kg)  07/15/16 165 lb (74.8 kg)  06/29/16 166 lb (75.3 kg)    Physical Exam  Constitutional: He is oriented to person, place, and time. He appears well-developed and well-nourished.  HENT:  Head: Normocephalic and atraumatic.  Eyes: Conjunctivae and EOM are normal.  Neck: Normal range of motion.  Cardiovascular: Normal rate, regular rhythm and normal heart sounds.   Pulmonary/Chest: Effort normal and breath sounds normal.  Musculoskeletal: Normal range of motion.  Neurological: He is alert and oriented to person, place, and time.  Skin: No erythema.  Psychiatric: He has a normal mood and affect. His behavior is normal. Judgment and thought content normal.    Results for orders placed or performed in visit on 06/29/16  Microscopic Examination  Result Value Ref Range   WBC, UA 0-5 0 - 5 /hpf   RBC, UA None seen 0 - 2 /hpf   Epithelial Cells (non renal)  0-10 0 - 10 /hpf   Bacteria, UA Few None seen/Few  Urinalysis, Routine w reflex microscopic (not at [facility])  Result Value Ref Range   Specific Gravity, UA 1.015 1.005 - 1.030   pH, UA 6.5 5.0 - 7.5   Color, UA Yellow Yellow   Appearance Ur Clear Clear   Leukocytes, UA Trace (A) Negative   Protein, UA Negative Negative/Trace   Glucose, UA Negative Negative   Ketones, UA Negative Negative   RBC, UA Negative Negative   Bilirubin, UA Negative Negative   Urobilinogen, Ur 1.0 0.2 - 1.0 mg/dL   Nitrite, UA Negative Negative   Microscopic Examination See below:   CBC with Differential/Platelet  Result Value Ref Range   WBC 7.3 3.4 - 10.8 x10E3/uL   RBC 4.63 4.14 - 5.80 x10E6/uL   Hemoglobin 14.1 12.6 - 17.7 g/dL   Hematocrit 42.0 37.5 - 51.0 %   MCV 91 79 - 97 fL   MCH 30.5 26.6 - 33.0 pg   MCHC 33.6 31.5 - 35.7 g/dL   RDW 14.0 12.3 - 15.4 %   Platelets 206 150 - 379 x10E3/uL   Neutrophils 55 %   Lymphs 28 %   Monocytes 11 %   Eos 5 %   Basos 1 %   Neutrophils Absolute 4.0 1.4 - 7.0 x10E3/uL   Lymphocytes  Absolute 2.0 0.7 - 3.1 x10E3/uL   Monocytes Absolute 0.8 0.1 - 0.9 x10E3/uL   EOS (ABSOLUTE) 0.4 0.0 - 0.4 x10E3/uL   Basophils Absolute 0.1 0.0 - 0.2 x10E3/uL   Immature Granulocytes 0 %   Immature Grans (Abs) 0.0 0.0 - 0.1 x10E3/uL  Comprehensive metabolic panel  Result Value Ref Range   Glucose 121 (H) 65 - 99 mg/dL   BUN 16 8 - 27 mg/dL   Creatinine, Ser 1.35 (H) 0.76 - 1.27 mg/dL   GFR calc non Af Amer 50 (L) >59 mL/min/1.73   GFR calc Af Amer 57 (L) >59 mL/min/1.73   BUN/Creatinine Ratio 12 10 - 24   Sodium 140 134 - 144 mmol/L   Potassium 4.5 3.5 - 5.2 mmol/L   Chloride 100 96 - 106 mmol/L   CO2 23 18 - 29 mmol/L   Calcium 9.2 8.6 - 10.2 mg/dL   Total Protein 6.8 6.0 - 8.5 g/dL   Albumin 4.3 3.5 - 4.8 g/dL   Globulin, Total 2.5 1.5 - 4.5 g/dL   Albumin/Globulin Ratio 1.7 1.2 - 2.2   Bilirubin Total 0.3 0.0 - 1.2 mg/dL   Alkaline Phosphatase 64 39 - 117 IU/L    AST 27 0 - 40 IU/L   ALT 27 0 - 44 IU/L  Lipid Panel w/o Chol/HDL Ratio  Result Value Ref Range   Cholesterol, Total 201 (H) 100 - 199 mg/dL   Triglycerides 291 (H) 0 - 149 mg/dL   HDL 34 (L) >39 mg/dL   VLDL Cholesterol Cal 58 (H) 5 - 40 mg/dL   LDL Calculated 109 (H) 0 - 99 mg/dL  TSH  Result Value Ref Range   TSH 3.670 0.450 - 4.500 uIU/mL      Assessment & Plan:   Problem List Items Addressed This Visit      Cardiovascular and Mediastinum   Hypertension - Primary    The current medical regimen is effective;  continue present plan and medications.       Relevant Medications   lisinopril (PRINIVIL,ZESTRIL) 10 MG tablet   Other Relevant Orders   Basic metabolic panel     Digestive   Stricture and stenosis of esophagus    The current medical regimen is effective;  continue present plan and medications.         Other   Hyperlipemia   Relevant Medications   lisinopril (PRINIVIL,ZESTRIL) 10 MG tablet   Other Relevant Orders   Basic metabolic panel       Follow up plan: Return in about 3 months (around 06/20/2017) for Physical Exam, August.

## 2017-03-21 NOTE — Assessment & Plan Note (Signed)
The current medical regimen is effective;  continue present plan and medications.  

## 2017-03-22 LAB — BASIC METABOLIC PANEL
BUN/Creatinine Ratio: 8 — ABNORMAL LOW (ref 10–24)
BUN: 13 mg/dL (ref 8–27)
CALCIUM: 9.5 mg/dL (ref 8.6–10.2)
CO2: 20 mmol/L (ref 18–29)
CREATININE: 1.58 mg/dL — AB (ref 0.76–1.27)
Chloride: 101 mmol/L (ref 96–106)
GFR calc Af Amer: 47 mL/min/{1.73_m2} — ABNORMAL LOW (ref >59)
GFR, EST NON AFRICAN AMERICAN: 41 mL/min/{1.73_m2} — AB (ref >59)
GLUCOSE: 139 mg/dL — AB (ref 65–99)
Potassium: 4.2 mmol/L (ref 3.5–5.2)
Sodium: 141 mmol/L (ref 134–144)

## 2017-03-23 ENCOUNTER — Telehealth: Payer: Self-pay | Admitting: Family Medicine

## 2017-03-23 NOTE — Telephone Encounter (Signed)
Phone call Discussed with patient slight decline in renal function patient not taking any extra nonsteroidal anti-inflammatory agents will observe with BMP next office visit.

## 2017-05-06 ENCOUNTER — Telehealth: Payer: Self-pay | Admitting: Family Medicine

## 2017-05-06 NOTE — Telephone Encounter (Signed)
Called pt to schedule Annual Wellness Visit with [facility]  - knb  °

## 2017-06-08 ENCOUNTER — Ambulatory Visit (INDEPENDENT_AMBULATORY_CARE_PROVIDER_SITE_OTHER): Payer: Medicare Other

## 2017-06-08 VITALS — BP 114/60 | HR 74 | Temp 97.1°F | Resp 16 | Ht 68.0 in | Wt 166.6 lb

## 2017-06-08 DIAGNOSIS — Z0000 Encounter for general adult medical examination without abnormal findings: Secondary | ICD-10-CM

## 2017-06-08 NOTE — Progress Notes (Signed)
Subjective:   Vincent Koch is a 83 y.o. male who presents for Medicare Annual/Subsequent preventive examination.  Review of Systems:   Cardiac Risk Factors include: male gender;advanced age (>65men, >55 women);dyslipidemia;hypertension     Objective:    Vitals: BP 114/60 (BP Location: Left Arm, Patient Position: Sitting)   Pulse 74   Temp (!) 97.1 F (36.2 C)   Resp 16   Ht 5\' 8"  (1.727 m)   Wt 166 lb 9.6 oz (75.6 kg)   BMI 25.33 kg/m   Body mass index is 25.33 kg/m.  Tobacco History  Smoking Status  . Never Smoker  Smokeless Tobacco  . Never Used     Counseling given: Not Answered   Past Medical History:  Diagnosis Date  . Bradycardia   . Chronic kidney disease   . GERD (gastroesophageal reflux disease)   . Hypertension   . Neuropathy    toes  . Osteoporosis   . Peripheral neuropathy   . Thyroid disease    Past Surgical History:  Procedure Laterality Date  . ESOPHAGEAL DILATION N/A 06/14/2016   Procedure: ESOPHAGOGASTRODUODENOSCOPY (EGD) WITH PROPOFOL with dilation;  Surgeon: Lucilla Lame, MD;  Location: [place];  Service: Endoscopy;  Laterality: N/A;  . ESOPHAGOGASTRODUODENOSCOPY (EGD) WITH PROPOFOL N/A 07/15/2016   Procedure: ESOPHAGOGASTRODUODENOSCOPY (EGD) WITH DILATION;  Surgeon: Lucilla Lame, MD;  Location: [place];  Service: Endoscopy;  Laterality: N/A;  . HERNIA REPAIR    . PARATHYROIDECTOMY     Family History  Problem Relation Age of Onset  . Stroke Mother   . Cancer Father    History  Sexual Activity  . Sexual activity: Not on file    Outpatient Encounter Prescriptions as of 06/08/2017  Medication Sig  . aspirin EC 81 MG tablet Take 81 mg by mouth once.   . calcium-vitamin D (CALCIUM 500/D) 500-200 MG-UNIT tablet Take 1 tablet by mouth.  . cyanocobalamin 500 MCG tablet Take 500 mcg by mouth daily.  . fexofenadine (ALLEGRA) 180 MG tablet Take 180 mg by mouth as needed for allergies.   Marland Kitchen lisinopril  (PRINIVIL,ZESTRIL) 10 MG tablet Take 1 tablet (10 mg total) by mouth daily.  . Multiple Vitamin (MULTI-VITAMINS) TABS Take by mouth daily.   . pantoprazole (PROTONIX) 40 MG tablet TAKE ONE TABLET BY MOUTH ONCE DAILY  . [DISCONTINUED] Ginkgo Biloba (GINKOBA PO) Take 1 tablet by mouth daily.    No facility-administered encounter medications on file as of 06/08/2017.     Activities of Daily Living In your present state of health, do you have any difficulty performing the following activities: 06/08/2017 07/15/2016  Hearing? N N  Vision? N N  Difficulty concentrating or making decisions? N N  Walking or climbing stairs? N N  Dressing or bathing? N N  Doing errands, shopping? N -  Preparing Food and eating ? N -  Using the Toilet? N -  In the past six months, have you accidently leaked urine? N -  Do you have problems with loss of bowel control? N -  Managing your Medications? N -  Managing your Finances? N -  Housekeeping or managing your Housekeeping? N -  Some recent data might be hidden    Patient Care Team: Guadalupe Maple, MD as PCP - General (Family Medicine) Elease Etienne, MD (Internal Medicine) Lucilla Lame, MD as Consulting Physician (Gastroenterology)   Assessment:     Exercise Activities and Dietary recommendations Current Exercise Habits: Home exercise routine, Type of exercise:  calisthenics;walking, Time (Minutes): 15, Frequency (Times/Week): 5, Weekly Exercise (Minutes/Week): 75, Intensity: Mild, Exercise limited by: None identified  Goals    . Increase water intake          Recommend to continue drinking at least 5-6 glasses of water a day      Fall Risk Fall Risk  06/08/2017 06/29/2016  Falls in the past year? No No   Depression Screen PHQ 2/9 Scores 06/08/2017 06/29/2016  PHQ - 2 Score 0 0    Cognitive Function     6CIT Screen 06/08/2017  What Year? 0 points  What month? 0 points  What time? 0 points  Count back from 20 0 points  Months in reverse 0  points  Repeat phrase 2 points  Total Score 2    Immunization History  Administered Date(s) Administered  . Influenza,inj,Quad PF,36+ Mos 09/22/2015  . Pneumococcal Conjugate-13 10/10/2014  . Pneumococcal-Unspecified 11/02/2004  . Tdap 06/29/2016  . Zoster 01/30/2007   Screening Tests Health Maintenance  Topic Date Due  . INFLUENZA VACCINE  06/22/2017  . TETANUS/TDAP  06/29/2026  . PNA vac Low Risk Adult  Completed      Plan:    I have personally reviewed and addressed the Medicare Annual Wellness questionnaire and have noted the following in the patient's chart:  A. Medical and social history B. Use of alcohol, tobacco or illicit drugs  C. Current medications and supplements D. Functional ability and status E.  Nutritional status F.  Physical activity G. Advance directives H. List of other physicians I.  Hospitalizations, surgeries, and ER visits in previous 12 months J.  [place] such as hearing and vision if needed, cognitive and depression L. Referrals and appointments   In addition, I have reviewed and discussed with patient certain preventive protocols, quality metrics, and best practice recommendations. A written personalized care plan for preventive services as well as general preventive health recommendations were provided to patient.   Signed,  Tyler Aas, LPN Nurse Health Advisor   MD Recommendations:none

## 2017-06-08 NOTE — Patient Instructions (Signed)
Vincent Koch , Thank you for taking time to come for your Medicare Wellness Visit. I appreciate your ongoing commitment to your health goals. Please review the following plan we discussed and let me know if I can assist you in the future.   Screening recommendations/referrals: Colonoscopy: completed 03/08/2007 Recommended yearly ophthalmology/optometry visit for glaucoma screening and checkup Recommended yearly dental visit for hygiene and checkup  Vaccinations: Influenza vaccine: up to date, 07/2017 Pneumococcal vaccine: up to date  Tdap vaccine: up to date Shingles vaccine: up to date   Advanced directives: Advance directive discussed with you today. I have provided a copy for you to complete at home and have notarized. Once this is complete please bring a copy in to our office so we can scan it into your chart.  Conditions/risks identified: Recommend to continue drinking at least 5-6 glasses of water a day  Next appointment: Follow up in 06/22/2017 10:00am with Dr.Crissman. Follow up in one year for your annual wellness exam.   Preventive Care 65 Years and Older, Male Preventive care refers to lifestyle choices and visits with your health care provider that can promote health and wellness. What does preventive care include?  A yearly physical exam. This is also called an annual well check.  Dental exams once or twice a year.  Routine eye exams. Ask your health care provider how often you should have your eyes checked.  Personal lifestyle choices, including:  Daily care of your teeth and gums.  Regular physical activity.  Eating a healthy diet.  Avoiding tobacco and drug use.  Limiting alcohol use.  Practicing safe sex.  Taking low doses of aspirin every day.  Taking vitamin and mineral supplements as recommended by your health care provider. What happens during an annual well check? The services and screenings done by your health care provider during your annual well  check will depend on your age, overall health, lifestyle risk factors, and family history of disease. Counseling  Your health care provider may ask you questions about your:  Alcohol use.  Tobacco use.  Drug use.  Emotional well-being.  Home and relationship well-being.  Sexual activity.  Eating habits.  History of falls.  Memory and ability to understand (cognition).  Work and work Statistician. Screening  You may have the following tests or measurements:  Height, weight, and BMI.  Blood pressure.  Lipid and cholesterol levels. These may be checked every 5 years, or more frequently if you are over 45 years old.  Skin check.  Lung cancer screening. You may have this screening every year starting at age 55 if you have a 30-pack-year history of smoking and currently smoke or have quit within the past 15 years.  Fecal occult blood test (FOBT) of the stool. You may have this test every year starting at age 40.  Flexible sigmoidoscopy or colonoscopy. You may have a sigmoidoscopy every 5 years or a colonoscopy every 10 years starting at age 24.  Prostate cancer screening. Recommendations will vary depending on your family history and other risks.  Hepatitis C blood test.  Hepatitis B blood test.  Sexually transmitted disease (STD) testing.  Diabetes screening. This is done by checking your blood sugar (glucose) after you have not eaten for a while (fasting). You may have this done every 1-3 years.  Abdominal aortic aneurysm (AAA) screening. You may need this if you are a current or former smoker.  Osteoporosis. You may be screened starting at age 34 if you are at high  risk. Talk with your health care provider about your test results, treatment options, and if necessary, the need for more tests. Vaccines  Your health care provider may recommend certain vaccines, such as:  Influenza vaccine. This is recommended every year.  Tetanus, diphtheria, and acellular  pertussis (Tdap, Td) vaccine. You may need a Td booster every 10 years.  Zoster vaccine. You may need this after age 72.  Pneumococcal 13-valent conjugate (PCV13) vaccine. One dose is recommended after age 21.  Pneumococcal polysaccharide (PPSV23) vaccine. One dose is recommended after age 103. Talk to your health care provider about which screenings and vaccines you need and how often you need them. This information is not intended to replace advice given to you by your health care provider. Make sure you discuss any questions you have with your health care provider. Document Released: 12/05/2015 Document Revised: 07/28/2016 Document Reviewed: 09/09/2015 Elsevier Interactive Patient Education  2017 [place] Prevention in the Home Falls can cause injuries. They can happen to people of all ages. There are many things you can do to make your home safe and to help prevent falls. What can I do on the outside of my home?  Regularly fix the edges of walkways and driveways and fix any cracks.  Remove anything that might make you trip as you walk through a door, such as a raised step or threshold.  Trim any bushes or trees on the path to your home.  Use bright outdoor lighting.  Clear any walking paths of anything that might make someone trip, such as rocks or tools.  Regularly check to see if handrails are loose or broken. Make sure that both sides of any steps have handrails.  Any raised decks and porches should have guardrails on the edges.  Have any leaves, snow, or ice cleared regularly.  Use sand or salt on walking paths during winter.  Clean up any spills in your garage right away. This includes oil or grease spills. What can I do in the bathroom?  Use night lights.  Install grab bars by the toilet and in the tub and shower. Do not use towel bars as grab bars.  Use non-skid mats or decals in the tub or shower.  If you need to sit down in the shower, use a plastic,  non-slip stool.  Keep the floor dry. Clean up any water that spills on the floor as soon as it happens.  Remove soap buildup in the tub or shower regularly.  Attach bath mats securely with double-sided non-slip rug tape.  Do not have throw rugs and other things on the floor that can make you trip. What can I do in the bedroom?  Use night lights.  Make sure that you have a light by your bed that is easy to reach.  Do not use any sheets or blankets that are too big for your bed. They should not hang down onto the floor.  Have a firm chair that has side arms. You can use this for support while you get dressed.  Do not have throw rugs and other things on the floor that can make you trip. What can I do in the kitchen?  Clean up any spills right away.  Avoid walking on wet floors.  Keep items that you use a lot in easy-to-reach places.  If you need to reach something above you, use a strong step stool that has a grab bar.  Keep electrical cords out of the way.  Do not use floor polish or wax that makes floors slippery. If you must use wax, use non-skid floor wax.  Do not have throw rugs and other things on the floor that can make you trip. What can I do with my stairs?  Do not leave any items on the stairs.  Make sure that there are handrails on both sides of the stairs and use them. Fix handrails that are broken or loose. Make sure that handrails are as long as the stairways.  Check any carpeting to make sure that it is firmly attached to the stairs. Fix any carpet that is loose or worn.  Avoid having throw rugs at the top or bottom of the stairs. If you do have throw rugs, attach them to the floor with carpet tape.  Make sure that you have a light switch at the top of the stairs and the bottom of the stairs. If you do not have them, ask someone to add them for you. What else can I do to help prevent falls?  Wear shoes that:  Do not have high heels.  Have rubber  bottoms.  Are comfortable and fit you well.  Are closed at the toe. Do not wear sandals.  If you use a stepladder:  Make sure that it is fully opened. Do not climb a closed stepladder.  Make sure that both sides of the stepladder are locked into place.  Ask someone to hold it for you, if possible.  Clearly mark and make sure that you can see:  Any grab bars or handrails.  First and last steps.  Where the edge of each step is.  Use tools that help you move around (mobility aids) if they are needed. These include:  Canes.  Walkers.  Scooters.  Crutches.  Turn on the lights when you go into a dark area. Replace any light bulbs as soon as they burn out.  Set up your furniture so you have a clear path. Avoid moving your furniture around.  If any of your floors are uneven, fix them.  If there are any pets around you, be aware of where they are.  Review your medicines with your doctor. Some medicines can make you feel dizzy. This can increase your chance of falling. Ask your doctor what other things that you can do to help prevent falls. This information is not intended to replace advice given to you by your health care provider. Make sure you discuss any questions you have with your health care provider. Document Released: 09/04/2009 Document Revised: 04/15/2016 Document Reviewed: 12/13/2014 Elsevier Interactive Patient Education  2017 [employer].

## 2017-06-22 ENCOUNTER — Encounter: Payer: Self-pay | Admitting: Family Medicine

## 2017-06-22 ENCOUNTER — Ambulatory Visit (INDEPENDENT_AMBULATORY_CARE_PROVIDER_SITE_OTHER): Payer: Medicare Other | Admitting: Family Medicine

## 2017-06-22 VITALS — BP 125/76 | HR 76 | Wt 167.6 lb

## 2017-06-22 DIAGNOSIS — G609 Hereditary and idiopathic neuropathy, unspecified: Secondary | ICD-10-CM | POA: Diagnosis not present

## 2017-06-22 DIAGNOSIS — Z1329 Encounter for screening for other suspected endocrine disorder: Secondary | ICD-10-CM | POA: Diagnosis not present

## 2017-06-22 DIAGNOSIS — N182 Chronic kidney disease, stage 2 (mild): Secondary | ICD-10-CM | POA: Diagnosis not present

## 2017-06-22 DIAGNOSIS — R1310 Dysphagia, unspecified: Secondary | ICD-10-CM | POA: Diagnosis not present

## 2017-06-22 DIAGNOSIS — Z0000 Encounter for general adult medical examination without abnormal findings: Secondary | ICD-10-CM

## 2017-06-22 DIAGNOSIS — E785 Hyperlipidemia, unspecified: Secondary | ICD-10-CM

## 2017-06-22 DIAGNOSIS — Z7189 Other specified counseling: Secondary | ICD-10-CM | POA: Insufficient documentation

## 2017-06-22 DIAGNOSIS — N400 Enlarged prostate without lower urinary tract symptoms: Secondary | ICD-10-CM

## 2017-06-22 DIAGNOSIS — I10 Essential (primary) hypertension: Secondary | ICD-10-CM

## 2017-06-22 DIAGNOSIS — D692 Other nonthrombocytopenic purpura: Secondary | ICD-10-CM

## 2017-06-22 LAB — URINALYSIS, ROUTINE W REFLEX MICROSCOPIC
Bilirubin, UA: NEGATIVE
Glucose, UA: NEGATIVE
Leukocytes, UA: NEGATIVE
NITRITE UA: NEGATIVE
RBC, UA: NEGATIVE
Specific Gravity, UA: 1.02 (ref 1.005–1.030)
UUROB: 1 mg/dL (ref 0.2–1.0)
pH, UA: 5.5 (ref 5.0–7.5)

## 2017-06-22 LAB — MICROSCOPIC EXAMINATION
Bacteria, UA: NONE SEEN
RBC MICROSCOPIC, UA: NONE SEEN /HPF (ref 0–?)

## 2017-06-22 MED ORDER — PANTOPRAZOLE SODIUM 40 MG PO TBEC: 40.0000 mg | DELAYED_RELEASE_TABLET | Freq: Every day | ORAL | 4 refills | Status: DC

## 2017-06-22 MED ORDER — LISINOPRIL 10 MG PO TABS: 10.0000 mg | ORAL_TABLET | Freq: Every day | ORAL | 4 refills | Status: DC

## 2017-06-22 NOTE — Assessment & Plan Note (Signed)
No sx 

## 2017-06-22 NOTE — Assessment & Plan Note (Signed)
Discussed and obs  

## 2017-06-22 NOTE — Progress Notes (Signed)
BP 125/76   Pulse 76   Wt 167 lb 9.6 oz (76 kg)   SpO2 99%   BMI 25.48 kg/m    Subjective:    Patient ID: Vincent Koch, male    DOB: 1934/05/10, 83 y.o.   MRN: [id number]  HPI: Vincent Koch is a 83 y.o. male  Chief Complaint  Patient presents with  . Annual Exam  Patient all in all doing well no complaints takes Protonix without problems controlled straight symptoms well. Takes lisinopril for blood pressure does well without issues. Takes vitamins without problems and aspirin. No change in neuropathy symptoms and toes.  Relevant past medical, surgical, family and social history reviewed and updated as indicated. Interim medical history since our last visit reviewed. Allergies and medications reviewed and updated.  Review of Systems  Constitutional: Negative.   HENT: Negative.   Eyes: Negative.   Respiratory: Negative.   Cardiovascular: Negative.   Gastrointestinal: Negative.   Endocrine: Negative.   Genitourinary: Negative.   Musculoskeletal: Negative.   Skin: Negative.   Allergic/Immunologic: Negative.   Neurological: Negative.   Hematological: Negative.   Psychiatric/Behavioral: Negative.     Per HPI unless specifically indicated above     Objective:    BP 125/76   Pulse 76   Wt 167 lb 9.6 oz (76 kg)   SpO2 99%   BMI 25.48 kg/m   Wt Readings from Last 3 Encounters:  06/22/17 167 lb 9.6 oz (76 kg)  06/08/17 166 lb 9.6 oz (75.6 kg)  03/21/17 170 lb (77.1 kg)    Physical Exam  Constitutional: He is oriented to person, place, and time. He appears well-developed and well-nourished.  HENT:  Head: Normocephalic and atraumatic.  Right Ear: External ear normal.  Left Ear: External ear normal.  Eyes: Pupils are equal, round, and reactive to light. Conjunctivae and EOM are normal.  Neck: Normal range of motion. Neck supple.  Cardiovascular: Normal rate, regular rhythm, normal heart sounds and intact distal pulses.   Pulmonary/Chest: Effort  normal and breath sounds normal.  Abdominal: Soft. Bowel sounds are normal. There is no splenomegaly or hepatomegaly.  Genitourinary: Rectum normal and penis normal.  Genitourinary Comments: BPH changes  Musculoskeletal: Normal range of motion.  Neurological: He is alert and oriented to person, place, and time. He has normal reflexes.  Skin: No rash noted. No erythema.  Psychiatric: He has a normal mood and affect. His behavior is normal. Judgment and thought content normal.    Results for orders placed or performed in visit on 79/39/03  Basic metabolic panel  Result Value Ref Range   Glucose 139 (H) 65 - 99 mg/dL   BUN 13 8 - 27 mg/dL   Creatinine, Ser 1.58 (H) 0.76 - 1.27 mg/dL   GFR calc non Af Amer 41 (L) >59 mL/min/1.73   GFR calc Af Amer 47 (L) >59 mL/min/1.73   BUN/Creatinine Ratio 8 (L) 10 - 24   Sodium 141 134 - 144 mmol/L   Potassium 4.2 3.5 - 5.2 mmol/L   Chloride 101 96 - 106 mmol/L   CO2 20 18 - 29 mmol/L   Calcium 9.5 8.6 - 10.2 mg/dL      Assessment & Plan:   Problem List Items Addressed This Visit      Cardiovascular and Mediastinum   Hypertension - Primary    The current medical regimen is effective;  continue present plan and medications.       Relevant Medications   lisinopril (PRINIVIL,ZESTRIL)  10 MG tablet   Other Relevant Orders   CBC with Differential/Platelet   Purpura senilis (HCC)    Discussed and obs      Relevant Medications   lisinopril (PRINIVIL,ZESTRIL) 10 MG tablet     Digestive   Problems with swallowing and mastication    The current medical regimen is effective;  continue present plan and medications.         Nervous and Auditory   Peripheral neuropathy    The current medical regimen is effective;  continue present plan and medications.         Genitourinary   Chronic renal insufficiency   Relevant Orders   Comprehensive metabolic panel   Urinalysis, Routine w reflex microscopic   BPH (benign prostatic hyperplasia)      No sx        Other   Hyperlipemia   Relevant Medications   lisinopril (PRINIVIL,ZESTRIL) 10 MG tablet   Other Relevant Orders   Lipid panel   Urinalysis, Routine w reflex microscopic   Advanced care planning/counseling discussion    A voluntary discussion about advance care planning including the explanation and discussion of advance directives was extensively discussed  with the patient.  Explanation about the health care proxy and Living will was reviewed and packet with forms with explanation of how to fill them out was given.Will bring copies.       Other Visit Diagnoses    Annual physical exam       Thyroid disorder screen       Relevant Orders   TSH       Follow up plan: Return in about 6 months (around 12/23/2017) for BMP.

## 2017-06-22 NOTE — Assessment & Plan Note (Signed)
A voluntary discussion about advance care planning including the explanation and discussion of advance directives was extensively discussed  with the patient.  Explanation about the health care proxy and Living will was reviewed and packet with forms with explanation of how to fill them out was given.  Will bring copies. 

## 2017-06-22 NOTE — Assessment & Plan Note (Signed)
The current medical regimen is effective;  continue present plan and medications.  

## 2017-06-23 ENCOUNTER — Encounter: Payer: Self-pay | Admitting: Family Medicine

## 2017-06-23 LAB — LIPID PANEL
CHOL/HDL RATIO: 5.4 ratio — AB (ref 0.0–5.0)
Cholesterol, Total: 198 mg/dL (ref 100–199)
HDL: 37 mg/dL — AB (ref >39)
LDL Calculated: 116 mg/dL — ABNORMAL HIGH (ref 0–99)
Triglycerides: 225 mg/dL — ABNORMAL HIGH (ref 0–149)
VLDL Cholesterol Cal: 45 mg/dL — ABNORMAL HIGH (ref 5–40)

## 2017-06-23 LAB — COMPREHENSIVE METABOLIC PANEL
A/G RATIO: 1.4 (ref 1.2–2.2)
ALBUMIN: 3.9 g/dL (ref 3.5–4.7)
ALT: 52 IU/L — AB (ref 0–44)
AST: 50 IU/L — ABNORMAL HIGH (ref 0–40)
Alkaline Phosphatase: 70 IU/L (ref 39–117)
BUN/Creatinine Ratio: 8 — ABNORMAL LOW (ref 10–24)
BUN: 13 mg/dL (ref 8–27)
Bilirubin Total: 0.5 mg/dL (ref 0.0–1.2)
CALCIUM: 9.1 mg/dL (ref 8.6–10.2)
CO2: 20 mmol/L (ref 20–29)
Chloride: 102 mmol/L (ref 96–106)
Creatinine, Ser: 1.6 mg/dL — ABNORMAL HIGH (ref 0.76–1.27)
GFR, EST AFRICAN AMERICAN: 46 mL/min/{1.73_m2} — AB (ref >59)
GFR, EST NON AFRICAN AMERICAN: 40 mL/min/{1.73_m2} — AB (ref >59)
GLOBULIN, TOTAL: 2.7 g/dL (ref 1.5–4.5)
Glucose: 132 mg/dL — ABNORMAL HIGH (ref 65–99)
POTASSIUM: 4.4 mmol/L (ref 3.5–5.2)
Sodium: 137 mmol/L (ref 134–144)
TOTAL PROTEIN: 6.6 g/dL (ref 6.0–8.5)

## 2017-06-23 LAB — CBC WITH DIFFERENTIAL/PLATELET
BASOS: 1 %
Basophils Absolute: 0.1 10*3/uL (ref 0.0–0.2)
EOS (ABSOLUTE): 0.3 10*3/uL (ref 0.0–0.4)
EOS: 5 %
HEMOGLOBIN: 14.6 g/dL (ref 13.0–17.7)
Hematocrit: 42.1 % (ref 37.5–51.0)
IMMATURE GRANULOCYTES: 0 %
Immature Grans (Abs): 0 10*3/uL (ref 0.0–0.1)
Lymphocytes Absolute: 1.8 10*3/uL (ref 0.7–3.1)
Lymphs: 31 %
MCH: 31.5 pg (ref 26.6–33.0)
MCHC: 34.7 g/dL (ref 31.5–35.7)
MCV: 91 fL (ref 79–97)
MONOCYTES: 8 %
Monocytes Absolute: 0.4 10*3/uL (ref 0.1–0.9)
NEUTROS ABS: 3.1 10*3/uL (ref 1.4–7.0)
NEUTROS PCT: 55 %
PLATELETS: 225 10*3/uL (ref 150–379)
RBC: 4.63 x10E6/uL (ref 4.14–5.80)
RDW: 13.7 % (ref 12.3–15.4)
WBC: 5.7 10*3/uL (ref 3.4–10.8)

## 2017-06-23 LAB — TSH: TSH: 4.19 u[IU]/mL (ref 0.450–4.500)

## 2017-06-29 DIAGNOSIS — H1089 Other conjunctivitis: Secondary | ICD-10-CM | POA: Diagnosis not present

## 2017-07-01 DIAGNOSIS — H1089 Other conjunctivitis: Secondary | ICD-10-CM | POA: Diagnosis not present

## 2017-07-04 DIAGNOSIS — H1089 Other conjunctivitis: Secondary | ICD-10-CM | POA: Diagnosis not present

## 2017-07-06 DIAGNOSIS — H1089 Other conjunctivitis: Secondary | ICD-10-CM | POA: Diagnosis not present

## 2017-08-01 ENCOUNTER — Ambulatory Visit (INDEPENDENT_AMBULATORY_CARE_PROVIDER_SITE_OTHER): Payer: Medicare Other | Admitting: Unknown Physician Specialty

## 2017-08-01 ENCOUNTER — Encounter: Payer: Self-pay | Admitting: Unknown Physician Specialty

## 2017-08-01 VITALS — BP 108/68 | HR 70 | Temp 98.0°F | Wt 163.6 lb

## 2017-08-01 DIAGNOSIS — R197 Diarrhea, unspecified: Secondary | ICD-10-CM

## 2017-08-01 LAB — CBC WITH DIFFERENTIAL/PLATELET
HEMOGLOBIN: 14.1 g/dL (ref 13.0–17.7)
Hematocrit: 41.9 % (ref 37.5–51.0)
Lymphocytes Absolute: 1.6 10*3/uL (ref 0.7–3.1)
Lymphs: 21 %
MCH: 30.4 pg (ref 26.6–33.0)
MCHC: 33.7 g/dL (ref 31.5–35.7)
MCV: 90 fL (ref 79–97)
MID (ABSOLUTE): 1.3 10*3/uL (ref 0.1–1.6)
MID: 17 %
Neutrophils Absolute: 4.9 10*3/uL (ref 1.4–7.0)
Neutrophils: 62 %
Platelets: 243 10*3/uL (ref 150–379)
RBC: 4.64 x10E6/uL (ref 4.14–5.80)
RDW: 13.8 % (ref 12.3–15.4)
WBC: 7.8 10*3/uL (ref 3.4–10.8)

## 2017-08-01 MED ORDER — CIPROFLOXACIN HCL 500 MG PO TABS: 500.0000 mg | ORAL_TABLET | Freq: Two times a day (BID) | ORAL | 0 refills | Status: DC

## 2017-08-01 NOTE — Progress Notes (Signed)
   BP 108/68   Pulse 70   Temp 98 F (36.7 C)   Wt 163 lb 9.6 oz (74.2 kg)   SpO2 98%   BMI 24.88 kg/m    Subjective:    Patient ID: Vincent Koch, male    DOB: 31-Jul-1934, 83 y.o.   MRN: [id number]  HPI: Vincent Koch is a 83 y.o. male  Chief Complaint  Patient presents with  . Diarrhea    pt states he has been having diarrhea and stomach pain since Tuesday night   Pt states he has had sudden onset of being hot and week 6 days ago.  Diarrhea set in shortly after for 4 days and 4 nights with stomach pain.  Better today, less diarrhea, and very weak.  No recent antibiotics.  Stool is liquid and yellow but turning brown.  No nausea.  Able to keep down fluids.  No recent antibiotics  Relevant past medical, surgical, family and social history reviewed and updated as indicated. Interim medical history since our last visit reviewed. Allergies and medications reviewed and updated.  Review of Systems  Constitutional: Positive for fatigue. Negative for fever and unexpected weight change.  HENT: Negative.   Respiratory: Negative.   Gastrointestinal: Positive for abdominal distention, abdominal pain, diarrhea and nausea. Negative for blood in stool and vomiting.  Genitourinary: Negative.   Skin: Negative.   Psychiatric/Behavioral: Negative.     Per HPI unless specifically indicated above     Objective:    BP 108/68   Pulse 70   Temp 98 F (36.7 C)   Wt 163 lb 9.6 oz (74.2 kg)   SpO2 98%   BMI 24.88 kg/m   Wt Readings from Last 3 Encounters:  08/01/17 163 lb 9.6 oz (74.2 kg)  06/22/17 167 lb 9.6 oz (76 kg)  06/08/17 166 lb 9.6 oz (75.6 kg)    Physical Exam  Constitutional: He is oriented to person, place, and time. He appears well-developed and well-nourished. No distress.  HENT:  Head: Normocephalic and atraumatic.  Eyes: Conjunctivae and lids are normal. Right eye exhibits no discharge. Left eye exhibits no discharge. No scleral icterus.  Neck: Normal range  of motion. Neck supple. No JVD present. Carotid bruit is not present.  Cardiovascular: Normal rate, regular rhythm and normal heart sounds.   Pulmonary/Chest: Effort normal and breath sounds normal. No respiratory distress.  Abdominal: Normal appearance. He exhibits distension. He exhibits no mass. There is no splenomegaly or hepatomegaly. There is no tenderness.  Musculoskeletal: Normal range of motion.  Neurological: He is alert and oriented to person, place, and time.  Skin: Skin is warm, dry and intact. No rash noted. No pallor.  Psychiatric: He has a normal mood and affect. His behavior is normal. Judgment and thought content normal.     CBC was normal.   Assessment & Plan:   Problem List Items Addressed This Visit    None    Visit Diagnoses    Diarrhea of presumed infectious origin    -  Primary   This is improving, however still a lot of distension. Encouraged to push fluids.  Will rx Cipro in case not continuing to improve   Relevant Orders   Comprehensive metabolic panel   CBC With Differential/Platelet       Follow up plan: Return if symptoms worsen or fail to improve.

## 2017-08-01 NOTE — Patient Instructions (Addendum)

## 2017-08-02 ENCOUNTER — Encounter: Payer: Self-pay | Admitting: Unknown Physician Specialty

## 2017-08-02 LAB — COMPREHENSIVE METABOLIC PANEL
ALBUMIN: 3.9 g/dL (ref 3.5–4.7)
ALT: 33 IU/L (ref 0–44)
AST: 35 IU/L (ref 0–40)
Albumin/Globulin Ratio: 1.7 (ref 1.2–2.2)
Alkaline Phosphatase: 68 IU/L (ref 39–117)
BILIRUBIN TOTAL: 0.4 mg/dL (ref 0.0–1.2)
BUN/Creatinine Ratio: 16 (ref 10–24)
BUN: 24 mg/dL (ref 8–27)
CO2: 20 mmol/L (ref 20–29)
CREATININE: 1.48 mg/dL — AB (ref 0.76–1.27)
Calcium: 8.8 mg/dL (ref 8.6–10.2)
Chloride: 99 mmol/L (ref 96–106)
GFR calc non Af Amer: 44 mL/min/{1.73_m2} — ABNORMAL LOW (ref >59)
GFR, EST AFRICAN AMERICAN: 51 mL/min/{1.73_m2} — AB (ref >59)
GLOBULIN, TOTAL: 2.3 g/dL (ref 1.5–4.5)
GLUCOSE: 94 mg/dL (ref 65–99)
Potassium: 3.9 mmol/L (ref 3.5–5.2)
SODIUM: 136 mmol/L (ref 134–144)
TOTAL PROTEIN: 6.2 g/dL (ref 6.0–8.5)

## 2017-08-08 ENCOUNTER — Encounter: Payer: Self-pay | Admitting: Unknown Physician Specialty

## 2017-08-08 ENCOUNTER — Ambulatory Visit (INDEPENDENT_AMBULATORY_CARE_PROVIDER_SITE_OTHER): Payer: Medicare Other | Admitting: Unknown Physician Specialty

## 2017-08-08 VITALS — BP 121/76 | HR 53 | Temp 97.5°F | Wt 165.0 lb

## 2017-08-08 DIAGNOSIS — K5900 Constipation, unspecified: Secondary | ICD-10-CM

## 2017-08-08 MED ORDER — METRONIDAZOLE 500 MG PO TABS: 500.0000 mg | ORAL_TABLET | Freq: Two times a day (BID) | ORAL | 0 refills | Status: DC

## 2017-08-08 NOTE — Progress Notes (Signed)
BP 121/76   Pulse (!) 53   Temp (!) 97.5 F (36.4 C)   Wt 165 lb (74.8 kg)   SpO2 99%   BMI 25.09 kg/m    Subjective:    Patient ID: Vincent Koch, male    DOB: [birth year], 83 y.o.   MRN: [id number]  HPI: TREVAR BOEHRINGER is a 83 y.o. male  Chief Complaint  Patient presents with  . Constipation    Diarrhea has resolved, now having constipation. Slight nausea, no vomiting. Bloating. Didn't take the Cipro antibiotic.    Pt is here stating that his diarrhea is better but not with complaints about persistent constipation.  The diarrhea stopped Saturday but no BMs since then.  He does feel bloated.  Never took the Cipro.  States his appetite is good.  He is concerned about worms and brought in a small specimen thinking that he has worms but looks like fiber  Relevant past medical, surgical, family and social history reviewed and updated as indicated. Interim medical history since our last visit reviewed. Allergies and medications reviewed and updated.  Review of Systems  Per HPI unless specifically indicated above     Objective:    BP 121/76   Pulse (!) 53   Temp (!) 97.5 F (36.4 C)   Wt 165 lb (74.8 kg)   SpO2 99%   BMI 25.09 kg/m   Wt Readings from Last 3 Encounters:  08/08/17 165 lb (74.8 kg)  08/01/17 163 lb 9.6 oz (74.2 kg)  06/22/17 167 lb 9.6 oz (76 kg)    Physical Exam  Constitutional: He is oriented to person, place, and time. He appears well-developed and well-nourished. No distress.  HENT:  Head: Normocephalic and atraumatic.  Eyes: Conjunctivae and lids are normal. Right eye exhibits no discharge. Left eye exhibits no discharge. No scleral icterus.  Neck: Normal range of motion. Neck supple. No JVD present. Carotid bruit is not present.  Cardiovascular: Normal rate, regular rhythm and normal heart sounds.   Pulmonary/Chest: Effort normal and breath sounds normal. No respiratory distress.  Abdominal: Soft. Normal appearance. He exhibits no  distension. There is no splenomegaly or hepatomegaly. There is no tenderness. There is no rebound.  Musculoskeletal: Normal range of motion.  Neurological: He is alert and oriented to person, place, and time.  Skin: Skin is warm, dry and intact. No rash noted. No pallor.  Psychiatric: He has a normal mood and affect. His behavior is normal. Judgment and thought content normal.    Results for orders placed or performed in visit on 08/01/17  Comprehensive metabolic panel  Result Value Ref Range   Glucose 94 65 - 99 mg/dL   BUN 24 8 - 27 mg/dL   Creatinine, Ser 1.48 (H) 0.76 - 1.27 mg/dL   GFR calc non Af Amer 44 (L) >59 mL/min/1.73   GFR calc Af Amer 51 (L) >59 mL/min/1.73   BUN/Creatinine Ratio 16 10 - 24   Sodium 136 134 - 144 mmol/L   Potassium 3.9 3.5 - 5.2 mmol/L   Chloride 99 96 - 106 mmol/L   CO2 20 20 - 29 mmol/L   Calcium 8.8 8.6 - 10.2 mg/dL   Total Protein 6.2 6.0 - 8.5 g/dL   Albumin 3.9 3.5 - 4.7 g/dL   Globulin, Total 2.3 1.5 - 4.5 g/dL   Albumin/Globulin Ratio 1.7 1.2 - 2.2   Bilirubin Total 0.4 0.0 - 1.2 mg/dL   Alkaline Phosphatase 68 39 - 117 IU/L  AST 35 0 - 40 IU/L   ALT 33 0 - 44 IU/L  CBC With Differential/Platelet  Result Value Ref Range   WBC 7.8 3.4 - 10.8 x10E3/uL   RBC 4.64 4.14 - 5.80 x10E6/uL   Hemoglobin 14.1 13.0 - 17.7 g/dL   Hematocrit 41.9 37.5 - 51.0 %   MCV 90 79 - 97 fL   MCH 30.4 26.6 - 33.0 pg   MCHC 33.7 31.5 - 35.7 g/dL   RDW 13.8 12.3 - 15.4 %   Platelets 243 150 - 379 x10E3/uL   Neutrophils 62 Not Estab. %   Lymphs 21 Not Estab. %   MID 17 Not Estab. %   Neutrophils Absolute 4.9 1.4 - 7.0 x10E3/uL   Lymphocytes Absolute 1.6 0.7 - 3.1 x10E3/uL   MID (Absolute) 1.3 0.1 - 1.6 X10E3/uL      Assessment & Plan:   Problem List Items Addressed This Visit    None    Visit Diagnoses    Constipation, unspecified constipation type    -  Primary   Constipation following several days of diarhea.  Appetite is good.  Less distended not.   Change to MOM for constipation.        Encouraged that recovery will take more than a couple of days.  Wife will call for advice if needed  Follow up plan: Return if symptoms worsen or fail to improve.

## 2017-09-12 ENCOUNTER — Ambulatory Visit (INDEPENDENT_AMBULATORY_CARE_PROVIDER_SITE_OTHER): Payer: Medicare Other

## 2017-09-12 DIAGNOSIS — Z23 Encounter for immunization: Secondary | ICD-10-CM

## 2017-09-14 ENCOUNTER — Telehealth: Payer: Self-pay | Admitting: Family Medicine

## 2017-09-14 NOTE — Telephone Encounter (Signed)
Copied from [facility] #[id number]. Topic: Quick Communication - See Telephone Encounter >> Sep 14, 2017  2:21 PM Arletha Grippe wrote: CRM for notification. See Telephone encounter for:  09/14/17. Pt called to let us know that he got his flu shot this past Monday. FYI.

## 2017-09-14 NOTE — Telephone Encounter (Signed)
Flu vaccine already documented

## 2017-12-22 DIAGNOSIS — M818 Other osteoporosis without current pathological fracture: Secondary | ICD-10-CM | POA: Diagnosis not present

## 2017-12-23 DIAGNOSIS — M818 Other osteoporosis without current pathological fracture: Secondary | ICD-10-CM | POA: Diagnosis not present

## 2017-12-27 DIAGNOSIS — M818 Other osteoporosis without current pathological fracture: Secondary | ICD-10-CM | POA: Diagnosis not present

## 2017-12-27 DIAGNOSIS — Z8639 Personal history of other endocrine, nutritional and metabolic disease: Secondary | ICD-10-CM | POA: Diagnosis not present

## 2017-12-27 DIAGNOSIS — E892 Postprocedural hypoparathyroidism: Secondary | ICD-10-CM | POA: Diagnosis not present

## 2017-12-28 ENCOUNTER — Ambulatory Visit (INDEPENDENT_AMBULATORY_CARE_PROVIDER_SITE_OTHER): Payer: Medicare Other | Admitting: Family Medicine

## 2017-12-28 ENCOUNTER — Encounter: Payer: Self-pay | Admitting: Family Medicine

## 2017-12-28 VITALS — BP 119/76 | HR 77 | Wt 167.0 lb

## 2017-12-28 DIAGNOSIS — K219 Gastro-esophageal reflux disease without esophagitis: Secondary | ICD-10-CM

## 2017-12-28 DIAGNOSIS — E210 Primary hyperparathyroidism: Secondary | ICD-10-CM

## 2017-12-28 DIAGNOSIS — M810 Age-related osteoporosis without current pathological fracture: Secondary | ICD-10-CM

## 2017-12-28 DIAGNOSIS — I10 Essential (primary) hypertension: Secondary | ICD-10-CM | POA: Diagnosis not present

## 2017-12-28 NOTE — Assessment & Plan Note (Signed)
Followed by endocrinology and stable 

## 2017-12-28 NOTE — Assessment & Plan Note (Signed)
The current medical regimen is effective;  continue present plan and medications.  

## 2017-12-28 NOTE — Assessment & Plan Note (Signed)
Followed by endocrinology also stable

## 2017-12-28 NOTE — Progress Notes (Signed)
BP 119/76   Pulse 77   Wt 167 lb (75.8 kg)   SpO2 99%   BMI 25.39 kg/m    Subjective:    Patient ID: Vincent Koch, male    DOB: [birth year], 84 y.o.   MRN: [id number]  HPI: Vincent Koch is a 84 y.o. male  Chief Complaint  Patient presents with  . Follow-up   Follow-up from blood pressure in medications.  Reviewed notes from endocrinology indicating bone density and calcium metabolism markedly improved after parathyroid surgery. Reviewed notes from endocrinology which are stable. Blood pressure doing okay with no problems or issues Reflux also doing well.  Relevant past medical, surgical, family and social history reviewed and updated as indicated. Interim medical history since our last visit reviewed. Allergies and medications reviewed and updated.  Review of Systems  Constitutional: Negative.   Respiratory: Negative.   Cardiovascular: Negative.     Per HPI unless specifically indicated above     Objective:    BP 119/76   Pulse 77   Wt 167 lb (75.8 kg)   SpO2 99%   BMI 25.39 kg/m   Wt Readings from Last 3 Encounters:  12/28/17 167 lb (75.8 kg)  08/08/17 165 lb (74.8 kg)  08/01/17 163 lb 9.6 oz (74.2 kg)    Physical Exam  Constitutional: He is oriented to person, place, and time. He appears well-developed and well-nourished.  HENT:  Head: Normocephalic and atraumatic.  Eyes: Conjunctivae and EOM are normal.  Neck: Normal range of motion.  Cardiovascular: Normal rate, regular rhythm and normal heart sounds.  Pulmonary/Chest: Effort normal and breath sounds normal.  Musculoskeletal: Normal range of motion.  Neurological: He is alert and oriented to person, place, and time.  Skin: No erythema.  Psychiatric: He has a normal mood and affect. His behavior is normal. Judgment and thought content normal.    Results for orders placed or performed in visit on 08/01/17  Comprehensive metabolic panel  Result Value Ref Range   Glucose 94 65 - 99 mg/dL     BUN 24 8 - 27 mg/dL   Creatinine, Ser 1.48 (H) 0.76 - 1.27 mg/dL   GFR calc non Af Amer 44 (L) >59 mL/min/1.73   GFR calc Af Amer 51 (L) >59 mL/min/1.73   BUN/Creatinine Ratio 16 10 - 24   Sodium 136 134 - 144 mmol/L   Potassium 3.9 3.5 - 5.2 mmol/L   Chloride 99 96 - 106 mmol/L   CO2 20 20 - 29 mmol/L   Calcium 8.8 8.6 - 10.2 mg/dL   Total Protein 6.2 6.0 - 8.5 g/dL   Albumin 3.9 3.5 - 4.7 g/dL   Globulin, Total 2.3 1.5 - 4.5 g/dL   Albumin/Globulin Ratio 1.7 1.2 - 2.2   Bilirubin Total 0.4 0.0 - 1.2 mg/dL   Alkaline Phosphatase 68 39 - 117 IU/L   AST 35 0 - 40 IU/L   ALT 33 0 - 44 IU/L  CBC With Differential/Platelet  Result Value Ref Range   WBC 7.8 3.4 - 10.8 x10E3/uL   RBC 4.64 4.14 - 5.80 x10E6/uL   Hemoglobin 14.1 13.0 - 17.7 g/dL   Hematocrit 41.9 37.5 - 51.0 %   MCV 90 79 - 97 fL   MCH 30.4 26.6 - 33.0 pg   MCHC 33.7 31.5 - 35.7 g/dL   RDW 13.8 12.3 - 15.4 %   Platelets 243 150 - 379 x10E3/uL   Neutrophils 62 Not Estab. %   Lymphs 21 Not  Estab. %   MID 17 Not Estab. %   Neutrophils Absolute 4.9 1.4 - 7.0 x10E3/uL   Lymphocytes Absolute 1.6 0.7 - 3.1 x10E3/uL   MID (Absolute) 1.3 0.1 - 1.6 X10E3/uL      Assessment & Plan:   Problem List Items Addressed This Visit      Cardiovascular and Mediastinum   Hypertension - Primary    The current medical regimen is effective;  continue present plan and medications.       Relevant Orders   Basic metabolic panel     Digestive   Gastroesophageal reflux disease without esophagitis    The current medical regimen is effective;  continue present plan and medications.         Endocrine   Primary hyperparathyroidism ([place])    Followed by endocrinology and stable        Musculoskeletal and Integument   Osteoporosis    Followed by endocrinology also stable          Follow up plan: Return in about 6 months (around 06/27/2018) for Physical Exam.

## 2017-12-29 ENCOUNTER — Encounter: Payer: Self-pay | Admitting: Family Medicine

## 2017-12-29 LAB — BASIC METABOLIC PANEL
BUN / CREAT RATIO: 10 (ref 10–24)
BUN: 16 mg/dL (ref 8–27)
CO2: 21 mmol/L (ref 20–29)
CREATININE: 1.68 mg/dL — AB (ref 0.76–1.27)
Calcium: 9.5 mg/dL (ref 8.6–10.2)
Chloride: 102 mmol/L (ref 96–106)
GFR calc Af Amer: 43 mL/min/{1.73_m2} — ABNORMAL LOW (ref >59)
GFR, EST NON AFRICAN AMERICAN: 38 mL/min/{1.73_m2} — AB (ref >59)
GLUCOSE: 123 mg/dL — AB (ref 65–99)
Potassium: 4.3 mmol/L (ref 3.5–5.2)
SODIUM: 138 mmol/L (ref 134–144)

## 2018-06-07 ENCOUNTER — Ambulatory Visit (INDEPENDENT_AMBULATORY_CARE_PROVIDER_SITE_OTHER): Payer: Medicare Other

## 2018-06-07 VITALS — BP 112/60 | HR 53 | Temp 98.6°F | Resp 16 | Ht 69.0 in | Wt 166.6 lb

## 2018-06-07 DIAGNOSIS — Z0000 Encounter for general adult medical examination without abnormal findings: Secondary | ICD-10-CM

## 2018-06-07 NOTE — Progress Notes (Signed)
Subjective:   Vincent Koch is a 84 y.o. male who presents for Medicare Annual/Subsequent preventive examination.  Review of Systems:   Cardiac Risk Factors include: advanced age (>20men, >1 women);dyslipidemia;hypertension     Objective:    Vitals: BP 112/60 (BP Location: Left Arm, Patient Position: Sitting)   Pulse (!) 53   Temp 98.6 F (37 C) (Temporal)   Resp 16   Ht 5\' 9"  (1.753 m)   Wt 166 lb 9.6 oz (75.6 kg)   BMI 24.60 kg/m   Body mass index is 24.6 kg/m.  Advanced Directives 06/07/2018 06/08/2017 07/15/2016 06/14/2016  Does Patient Have a Medical Advance Directive? Yes No No Yes  Type of Advance Directive Living will;Healthcare Power of Attorney - - Living will  Copy of [place] in Chart? No - copy requested - - -  Would patient like information on creating a medical advance directive? - Yes (MAU/Ambulatory/Procedural Areas - Information given) Yes - Educational materials given -    Tobacco Social History   Tobacco Use  Smoking Status Never Smoker  Smokeless Tobacco Never Used     Counseling given: Not Answered   Clinical Intake:  Pre-visit preparation completed: Yes  Pain : No/denies pain     Nutritional Status: BMI of 19-24  Normal Nutritional Risks: Nausea/ vomitting/ diarrhea(diarrhea occasionally ) Diabetes: No  How often do you need to have someone help you when you read instructions, pamphlets, or other written materials from your doctor or pharmacy?: 1 - Never What is the last grade level you completed in school?: masters degree   Interpreter Needed?: No  Information entered by :: Jahnessa Vanduyn,LPN   Past Medical History:  Diagnosis Date  . Bradycardia   . Chronic kidney disease   . GERD (gastroesophageal reflux disease)   . Hypertension   . Neuropathy    toes  . Osteoporosis   . Peripheral neuropathy   . Thyroid disease    Past Surgical History:  Procedure Laterality Date  . ESOPHAGEAL DILATION N/A  06/14/2016   Procedure: ESOPHAGOGASTRODUODENOSCOPY (EGD) WITH PROPOFOL with dilation;  Surgeon: Lucilla Lame, MD;  Location: [facility];  Service: Endoscopy;  Laterality: N/A;  . ESOPHAGOGASTRODUODENOSCOPY (EGD) WITH PROPOFOL N/A 07/15/2016   Procedure: ESOPHAGOGASTRODUODENOSCOPY (EGD) WITH DILATION;  Surgeon: Lucilla Lame, MD;  Location: [facility];  Service: Endoscopy;  Laterality: N/A;  . HERNIA REPAIR    . PARATHYROIDECTOMY     Family History  Problem Relation Age of Onset  . Stroke Mother   . Throat cancer Father    Social History   Socioeconomic History  . Marital status: Married    Spouse name: Not on file  . Number of children: Not on file  . Years of education: Not on file  . Highest education level: Master's degree (e.g., MA, MS, MEng, MEd, MSW, MBA)  Occupational History  . Not on file  Social Needs  . Financial resource strain: Not hard at all  . Food insecurity:    Worry: Never true    Inability: Never true  . Transportation needs:    Medical: No    Non-medical: No  Tobacco Use  . Smoking status: Never Smoker  . Smokeless tobacco: Never Used  Substance and Sexual Activity  . Alcohol use: No  . Drug use: No  . Sexual activity: Not on file  Lifestyle  . Physical activity:    Days per week: 0 days    Minutes per session: 0 min  .  Stress: Not at all  Relationships  . Social connections:    Talks on phone: More than three times a week    Gets together: More than three times a week    Attends religious service: More than 4 times per year    Active member of club or organization: No    Attends meetings of clubs or organizations: Never    Relationship status: Married  Other Topics Concern  . Not on file  Social History Narrative  . Not on file    Outpatient Encounter Medications as of 06/07/2018  Medication Sig  . acetaminophen (TYLENOL) 500 MG tablet Take 500 mg by mouth every 6 (six) hours as needed.  Marland Kitchen aspirin EC 81 MG tablet Take 81 mg  by mouth once.   . calcium-vitamin D (CALCIUM 500/D) 500-200 MG-UNIT tablet Take 1 tablet by mouth.  . fexofenadine (ALLEGRA) 180 MG tablet Take 180 mg by mouth as needed for allergies.   Marland Kitchen lisinopril (PRINIVIL,ZESTRIL) 10 MG tablet Take 1 tablet (10 mg total) by mouth daily.  . Multiple Vitamin (MULTI-VITAMINS) TABS Take by mouth daily.   . pantoprazole (PROTONIX) 40 MG tablet Take 1 tablet (40 mg total) by mouth daily.  . cyanocobalamin 500 MCG tablet Take 500 mcg by mouth daily.   No facility-administered encounter medications on file as of 06/07/2018.     Activities of Daily Living In your present state of health, do you have any difficulty performing the following activities: 06/07/2018 06/08/2017  Hearing? N N  Vision? Y N  Comment right eye needs cataract removed -  Difficulty concentrating or making decisions? N N  Walking or climbing stairs? N N  Dressing or bathing? N N  Doing errands, shopping? N N  Preparing Food and eating ? N N  Using the Toilet? N N  In the past six months, have you accidently leaked urine? N N  Do you have problems with loss of bowel control? N N  Managing your Medications? N N  Managing your Finances? N N  Housekeeping or managing your Housekeeping? N N  Some recent data might be hidden    Patient Care Team: Guadalupe Maple, MD as PCP - General (Family Medicine) Lucilla Lame, MD as Consulting Physician (Gastroenterology)   Assessment:   This is a routine wellness examination for Vincent Koch.  Exercise Activities and Dietary recommendations Current Exercise Habits: The patient does not participate in regular exercise at present(works in the yard ), Exercise limited by: None identified  Goals    . DIET - INCREASE WATER INTAKE     Recommend drinking at least 6-8 glasses of water a day        Fall Risk Fall Risk  06/07/2018 12/28/2017 06/22/2017 06/08/2017 06/29/2016  Falls in the past year? No No No No No   Is the patient's home free of loose throw  rugs in walkways, pet beds, electrical cords, etc?   yes      Grab bars in the bathroom? no      Handrails on the stairs?   yes       Adequate lighting?   yes  Timed Get Up and Go Performed: Completed in 8 seconds with no use of assistive devices, steady gait. No intervention needed at this time.   Depression Screen PHQ 2/9 Scores 06/07/2018 12/28/2017 06/22/2017 06/08/2017  PHQ - 2 Score 0 0 0 0    Cognitive Function     6CIT Screen 06/07/2018 06/08/2017  What Year? 0  points 0 points  What month? 0 points 0 points  What time? 0 points 0 points  Count back from 20 0 points 0 points  Months in reverse 0 points 0 points  Repeat phrase 2 points 2 points  Total Score 2 2    Immunization History  Administered Date(s) Administered  . Influenza, High Dose Seasonal PF 09/12/2017  . Influenza,inj,Quad PF,6+ Mos 09/22/2015  . Pneumococcal Conjugate-13 10/10/2014  . Pneumococcal-Unspecified 11/02/2004  . Tdap 06/29/2016  . Zoster 01/30/2007    Qualifies for Shingles Vaccine? Yes, discussed shingrix vaccine   Screening Tests Health Maintenance  Topic Date Due  . INFLUENZA VACCINE  06/22/2018  . TETANUS/TDAP  06/29/2026  . PNA vac Low Risk Adult  Completed   Cancer Screenings: Lung: Low Dose CT Chest recommended if Age 45-80 years, 30 pack-year currently smoking OR have quit w/in 15years. Patient does not qualify. Colorectal:  No longer required  Additional Screenings:  Hepatitis C Screening: not indicated       Plan:    I have personally reviewed and addressed the Medicare Annual Wellness questionnaire and have noted the following in the patient's chart:  A. Medical and social history B. Use of alcohol, tobacco or illicit drugs  C. Current medications and supplements D. Functional ability and status E.  Nutritional status F.  Physical activity G. Advance directives H. List of other physicians I.  Hospitalizations, surgeries, and ER visits in previous 12 months J.   [place] such as hearing and vision if needed, cognitive and depression L. Referrals and appointments   In addition, I have reviewed and discussed with patient certain preventive protocols, quality metrics, and best practice recommendations. A written personalized care plan for preventive services as well as general preventive health recommendations were provided to patient.   Signed,  Tyler Aas, LPN Nurse Health Advisor   Nurse Notes:none

## 2018-06-07 NOTE — Patient Instructions (Signed)
Vincent Koch , Thank you for taking time to come for your Medicare Wellness Visit. I appreciate your ongoing commitment to your health goals. Please review the following plan we discussed and let me know if I can assist you in the future.   Screening recommendations/referrals: Colonoscopy: no longer required Recommended yearly ophthalmology/optometry visit for glaucoma screening and checkup Recommended yearly dental visit for hygiene and checkup  Vaccinations: Influenza vaccine: due 07/2018 Pneumococcal vaccine: completed series Tdap vaccine: up to date Shingles vaccine: shingrix eligible, check with your insurance company for coverage     Advanced directives: Please bring a copy of your health care power of attorney and living will to the office at your convenience.  Conditions/risks identified: Recommend drinking at least 6-8 glasses of water a day   Next appointment: Follow up on 06/29/2018 at 2:00pm with Dr.Crissman, Follow up in one year for your annual wellness exam.   Preventive Care 65 Years and Older, Male Preventive care refers to lifestyle choices and visits with your health care provider that can promote health and wellness. What does preventive care include?  A yearly physical exam. This is also called an annual well check.  Dental exams once or twice a year.  Routine eye exams. Ask your health care provider how often you should have your eyes checked.  Personal lifestyle choices, including:  Daily care of your teeth and gums.  Regular physical activity.  Eating a healthy diet.  Avoiding tobacco and drug use.  Limiting alcohol use.  Practicing safe sex.  Taking low doses of aspirin every day.  Taking vitamin and mineral supplements as recommended by your health care provider. What happens during an annual well check? The services and screenings done by your health care provider during your annual well check will depend on your age, overall health, lifestyle  risk factors, and family history of disease. Counseling  Your health care provider may ask you questions about your:  Alcohol use.  Tobacco use.  Drug use.  Emotional well-being.  Home and relationship well-being.  Sexual activity.  Eating habits.  History of falls.  Memory and ability to understand (cognition).  Work and work Statistician. Screening  You may have the following tests or measurements:  Height, weight, and BMI.  Blood pressure.  Lipid and cholesterol levels. These may be checked every 5 years, or more frequently if you are over 81 years old.  Skin check.  Lung cancer screening. You may have this screening every year starting at age 40 if you have a 30-pack-year history of smoking and currently smoke or have quit within the past 15 years.  Fecal occult blood test (FOBT) of the stool. You may have this test every year starting at age 54.  Flexible sigmoidoscopy or colonoscopy. You may have a sigmoidoscopy every 5 years or a colonoscopy every 10 years starting at age 49.  Prostate cancer screening. Recommendations will vary depending on your family history and other risks.  Hepatitis C blood test.  Hepatitis B blood test.  Sexually transmitted disease (STD) testing.  Diabetes screening. This is done by checking your blood sugar (glucose) after you have not eaten for a while (fasting). You may have this done every 1-3 years.  Abdominal aortic aneurysm (AAA) screening. You may need this if you are a current or former smoker.  Osteoporosis. You may be screened starting at age 69 if you are at high risk. Talk with your health care provider about your test results, treatment options, and if  necessary, the need for more tests. Vaccines  Your health care provider may recommend certain vaccines, such as:  Influenza vaccine. This is recommended every year.  Tetanus, diphtheria, and acellular pertussis (Tdap, Td) vaccine. You may need a Td booster every 10  years.  Zoster vaccine. You may need this after age 20.  Pneumococcal 13-valent conjugate (PCV13) vaccine. One dose is recommended after age 62.  Pneumococcal polysaccharide (PPSV23) vaccine. One dose is recommended after age 70. Talk to your health care provider about which screenings and vaccines you need and how often you need them. This information is not intended to replace advice given to you by your health care provider. Make sure you discuss any questions you have with your health care provider. Document Released: 12/05/2015 Document Revised: 07/28/2016 Document Reviewed: 09/09/2015 Elsevier Interactive Patient Education  2017 [place] Prevention in the Home Falls can cause injuries. They can happen to people of all ages. There are many things you can do to make your home safe and to help prevent falls. What can I do on the outside of my home?  Regularly fix the edges of walkways and driveways and fix any cracks.  Remove anything that might make you trip as you walk through a door, such as a raised step or threshold.  Trim any bushes or trees on the path to your home.  Use bright outdoor lighting.  Clear any walking paths of anything that might make someone trip, such as rocks or tools.  Regularly check to see if handrails are loose or broken. Make sure that both sides of any steps have handrails.  Any raised decks and porches should have guardrails on the edges.  Have any leaves, snow, or ice cleared regularly.  Use sand or salt on walking paths during winter.  Clean up any spills in your garage right away. This includes oil or grease spills. What can I do in the bathroom?  Use night lights.  Install grab bars by the toilet and in the tub and shower. Do not use towel bars as grab bars.  Use non-skid mats or decals in the tub or shower.  If you need to sit down in the shower, use a plastic, non-slip stool.  Keep the floor dry. Clean up any water that  spills on the floor as soon as it happens.  Remove soap buildup in the tub or shower regularly.  Attach bath mats securely with double-sided non-slip rug tape.  Do not have throw rugs and other things on the floor that can make you trip. What can I do in the bedroom?  Use night lights.  Make sure that you have a light by your bed that is easy to reach.  Do not use any sheets or blankets that are too big for your bed. They should not hang down onto the floor.  Have a firm chair that has side arms. You can use this for support while you get dressed.  Do not have throw rugs and other things on the floor that can make you trip. What can I do in the kitchen?  Clean up any spills right away.  Avoid walking on wet floors.  Keep items that you use a lot in easy-to-reach places.  If you need to reach something above you, use a strong step stool that has a grab bar.  Keep electrical cords out of the way.  Do not use floor polish or wax that makes floors slippery. If you must  use wax, use non-skid floor wax.  Do not have throw rugs and other things on the floor that can make you trip. What can I do with my stairs?  Do not leave any items on the stairs.  Make sure that there are handrails on both sides of the stairs and use them. Fix handrails that are broken or loose. Make sure that handrails are as long as the stairways.  Check any carpeting to make sure that it is firmly attached to the stairs. Fix any carpet that is loose or worn.  Avoid having throw rugs at the top or bottom of the stairs. If you do have throw rugs, attach them to the floor with carpet tape.  Make sure that you have a light switch at the top of the stairs and the bottom of the stairs. If you do not have them, ask someone to add them for you. What else can I do to help prevent falls?  Wear shoes that:  Do not have high heels.  Have rubber bottoms.  Are comfortable and fit you well.  Are closed at the  toe. Do not wear sandals.  If you use a stepladder:  Make sure that it is fully opened. Do not climb a closed stepladder.  Make sure that both sides of the stepladder are locked into place.  Ask someone to hold it for you, if possible.  Clearly mark and make sure that you can see:  Any grab bars or handrails.  First and last steps.  Where the edge of each step is.  Use tools that help you move around (mobility aids) if they are needed. These include:  Canes.  Walkers.  Scooters.  Crutches.  Turn on the lights when you go into a dark area. Replace any light bulbs as soon as they burn out.  Set up your furniture so you have a clear path. Avoid moving your furniture around.  If any of your floors are uneven, fix them.  If there are any pets around you, be aware of where they are.  Review your medicines with your doctor. Some medicines can make you feel dizzy. This can increase your chance of falling. Ask your doctor what other things that you can do to help prevent falls. This information is not intended to replace advice given to you by your health care provider. Make sure you discuss any questions you have with your health care provider. Document Released: 09/04/2009 Document Revised: 04/15/2016 Document Reviewed: 12/13/2014 Elsevier Interactive Patient Education  2017 [employer].

## 2018-06-29 ENCOUNTER — Encounter: Payer: Self-pay | Admitting: Family Medicine

## 2018-06-29 ENCOUNTER — Ambulatory Visit (INDEPENDENT_AMBULATORY_CARE_PROVIDER_SITE_OTHER): Payer: Medicare Other | Admitting: Family Medicine

## 2018-06-29 VITALS — BP 132/73 | HR 53 | Ht 71.0 in | Wt 165.6 lb

## 2018-06-29 DIAGNOSIS — N182 Chronic kidney disease, stage 2 (mild): Secondary | ICD-10-CM

## 2018-06-29 DIAGNOSIS — N400 Enlarged prostate without lower urinary tract symptoms: Secondary | ICD-10-CM

## 2018-06-29 DIAGNOSIS — Z7189 Other specified counseling: Secondary | ICD-10-CM

## 2018-06-29 DIAGNOSIS — I10 Essential (primary) hypertension: Secondary | ICD-10-CM | POA: Diagnosis not present

## 2018-06-29 DIAGNOSIS — D692 Other nonthrombocytopenic purpura: Secondary | ICD-10-CM

## 2018-06-29 DIAGNOSIS — E785 Hyperlipidemia, unspecified: Secondary | ICD-10-CM

## 2018-06-29 DIAGNOSIS — K219 Gastro-esophageal reflux disease without esophagitis: Secondary | ICD-10-CM

## 2018-06-29 DIAGNOSIS — E210 Primary hyperparathyroidism: Secondary | ICD-10-CM

## 2018-06-29 DIAGNOSIS — N183 Chronic kidney disease, stage 3 unspecified: Secondary | ICD-10-CM

## 2018-06-29 LAB — URINALYSIS, ROUTINE W REFLEX MICROSCOPIC
Bilirubin, UA: NEGATIVE
GLUCOSE, UA: NEGATIVE
Ketones, UA: NEGATIVE
Nitrite, UA: NEGATIVE
PH UA: 6 (ref 5.0–7.5)
RBC, UA: NEGATIVE
Specific Gravity, UA: 1.02 (ref 1.005–1.030)
UUROB: 0.2 mg/dL (ref 0.2–1.0)

## 2018-06-29 LAB — MICROSCOPIC EXAMINATION
Bacteria, UA: NONE SEEN
RBC, UA: NONE SEEN /hpf (ref 0–2)

## 2018-06-29 MED ORDER — LISINOPRIL 10 MG PO TABS: 10.0000 mg | ORAL_TABLET | Freq: Every day | ORAL | 4 refills | Status: DC

## 2018-06-29 MED ORDER — PANTOPRAZOLE SODIUM 40 MG PO TBEC: 40.0000 mg | DELAYED_RELEASE_TABLET | Freq: Every day | ORAL | 4 refills | Status: DC

## 2018-06-29 NOTE — Assessment & Plan Note (Signed)
Stable

## 2018-06-29 NOTE — Progress Notes (Signed)
BP 132/73   Pulse (!) 53   Ht 5\' 11"  (1.803 m)   Wt 165 lb 9.6 oz (75.1 kg)   SpO2 96%   BMI 23.10 kg/m    Subjective:    Patient ID: Vincent Koch, male    DOB: [birth year], 84 y.o.   MRN: [id number]  HPI: Vincent Koch is a 84 y.o. male  Chief Complaint  Patient presents with  . Annual Exam    Relevant past medical, surgical, family and social history reviewed and updated as indicated. Interim medical history since our last visit reviewed. Allergies and medications reviewed and updated.  Review of Systems  Constitutional: Negative.   HENT: Negative.   Eyes: Negative.   Respiratory: Negative.   Cardiovascular: Negative.   Gastrointestinal: Negative.   Endocrine: Negative.   Genitourinary: Negative.   Musculoskeletal: Negative.   Skin: Negative.   Allergic/Immunologic: Negative.   Neurological: Negative.   Hematological: Negative.   Psychiatric/Behavioral: Negative.     Per HPI unless specifically indicated above     Objective:    BP 132/73   Pulse (!) 53   Ht 5\' 11"  (1.803 m)   Wt 165 lb 9.6 oz (75.1 kg)   SpO2 96%   BMI 23.10 kg/m   Wt Readings from Last 3 Encounters:  06/29/18 165 lb 9.6 oz (75.1 kg)  06/07/18 166 lb 9.6 oz (75.6 kg)  12/28/17 167 lb (75.8 kg)    Physical Exam  Constitutional: He is oriented to person, place, and time. He appears well-developed and well-nourished.  HENT:  Head: Normocephalic and atraumatic.  Right Ear: External ear normal.  Left Ear: External ear normal.  Eyes: Pupils are equal, round, and reactive to light. Conjunctivae and EOM are normal.  Neck: Normal range of motion. Neck supple.  Cardiovascular: Normal rate, regular rhythm, normal heart sounds and intact distal pulses.  Pulmonary/Chest: Effort normal and breath sounds normal.  Abdominal: Soft. Bowel sounds are normal. There is no splenomegaly or hepatomegaly.  Genitourinary: Rectum normal and penis normal.  Genitourinary Comments: BPH changes    Musculoskeletal: Normal range of motion.  Neurological: He is alert and oriented to person, place, and time. He has normal reflexes.  Skin: No rash noted. No erythema.  Psychiatric: He has a normal mood and affect. His behavior is normal. Judgment and thought content normal.    Results for orders placed or performed in visit on 88/50/27  Basic metabolic panel  Result Value Ref Range   Glucose 123 (H) 65 - 99 mg/dL   BUN 16 8 - 27 mg/dL   Creatinine, Ser 1.68 (H) 0.76 - 1.27 mg/dL   GFR calc non Af Amer 38 (L) >59 mL/min/1.73   GFR calc Af Amer 43 (L) >59 mL/min/1.73   BUN/Creatinine Ratio 10 10 - 24   Sodium 138 134 - 144 mmol/L   Potassium 4.3 3.5 - 5.2 mmol/L   Chloride 102 96 - 106 mmol/L   CO2 21 20 - 29 mmol/L   Calcium 9.5 8.6 - 10.2 mg/dL      Assessment & Plan:   Problem List Items Addressed This Visit      Cardiovascular and Mediastinum   Hypertension - Primary    The current medical regimen is effective;  continue present plan and medications.       Relevant Medications   lisinopril (PRINIVIL,ZESTRIL) 10 MG tablet   Other Relevant Orders   CBC with Differential/Platelet   Comprehensive metabolic panel   Lipid panel  Urinalysis, Routine w reflex microscopic   TSH   Purpura senilis (HCC)    Stable        Relevant Medications   lisinopril (PRINIVIL,ZESTRIL) 10 MG tablet     Digestive   Gastroesophageal reflux disease without esophagitis    The current medical regimen is effective;  continue present plan and medications.       Relevant Medications   pantoprazole (PROTONIX) 40 MG tablet     Endocrine   Primary hyperparathyroidism ([place])   Relevant Orders   TSH     Genitourinary   Chronic renal insufficiency   Relevant Orders   Comprehensive metabolic panel   CKD (chronic kidney disease) stage 3, GFR 30-59 ml/min (HCC)   Relevant Orders   CBC with Differential/Platelet   Comprehensive metabolic panel   Lipid panel   Urinalysis, Routine w  reflex microscopic   TSH   BPH (benign prostatic hyperplasia)     Other   Hyperlipemia   Relevant Medications   lisinopril (PRINIVIL,ZESTRIL) 10 MG tablet   Other Relevant Orders   CBC with Differential/Platelet   Comprehensive metabolic panel   Lipid panel   Urinalysis, Routine w reflex microscopic   TSH   Advanced care planning/counseling discussion    A voluntary discussion about advanced care planning including explanation and discussion of advanced directives was extentively discussed with the patient.  Explained about the healthcare proxy and living will was reviewed and packet with forms with expiration of how to fill them out was given.  Time spent: Encounter 16+ min individuals present: Patient and wife          Follow up plan: Return in about 6 months (around 12/30/2018) for BMP.

## 2018-06-29 NOTE — Assessment & Plan Note (Signed)
A voluntary discussion about advanced care planning including explanation and discussion of advanced directives was extentively discussed with the patient.  Explained about the healthcare proxy and living will was reviewed and packet with forms with expiration of how to fill them out was given.  Time spent: Encounter 16+ min individuals present: Patient and wife 

## 2018-06-29 NOTE — Assessment & Plan Note (Signed)
The current medical regimen is effective;  continue present plan and medications.  

## 2018-06-30 LAB — COMPREHENSIVE METABOLIC PANEL
ALBUMIN: 4.5 g/dL (ref 3.5–4.7)
ALK PHOS: 74 IU/L (ref 39–117)
ALT: 37 IU/L (ref 0–44)
AST: 36 IU/L (ref 0–40)
Albumin/Globulin Ratio: 1.6 (ref 1.2–2.2)
BUN / CREAT RATIO: 10 (ref 10–24)
BUN: 14 mg/dL (ref 8–27)
Bilirubin Total: 0.7 mg/dL (ref 0.0–1.2)
CO2: 21 mmol/L (ref 20–29)
Calcium: 9.4 mg/dL (ref 8.6–10.2)
Chloride: 100 mmol/L (ref 96–106)
Creatinine, Ser: 1.37 mg/dL — ABNORMAL HIGH (ref 0.76–1.27)
GFR calc non Af Amer: 48 mL/min/{1.73_m2} — ABNORMAL LOW (ref >59)
GFR, EST AFRICAN AMERICAN: 56 mL/min/{1.73_m2} — AB (ref >59)
GLUCOSE: 97 mg/dL (ref 65–99)
Globulin, Total: 2.8 g/dL (ref 1.5–4.5)
POTASSIUM: 4.3 mmol/L (ref 3.5–5.2)
SODIUM: 140 mmol/L (ref 134–144)
TOTAL PROTEIN: 7.3 g/dL (ref 6.0–8.5)

## 2018-06-30 LAB — CBC WITH DIFFERENTIAL/PLATELET
BASOS ABS: 0.1 10*3/uL (ref 0.0–0.2)
BASOS: 1 %
EOS (ABSOLUTE): 0.3 10*3/uL (ref 0.0–0.4)
Eos: 4 %
HEMOGLOBIN: 15.9 g/dL (ref 13.0–17.7)
Hematocrit: 46.8 % (ref 37.5–51.0)
Immature Grans (Abs): 0 10*3/uL (ref 0.0–0.1)
Immature Granulocytes: 0 %
Lymphocytes Absolute: 1.8 10*3/uL (ref 0.7–3.1)
Lymphs: 24 %
MCH: 31.9 pg (ref 26.6–33.0)
MCHC: 34 g/dL (ref 31.5–35.7)
MCV: 94 fL (ref 79–97)
MONOCYTES: 9 %
Monocytes Absolute: 0.6 10*3/uL (ref 0.1–0.9)
NEUTROS ABS: 4.7 10*3/uL (ref 1.4–7.0)
Neutrophils: 62 %
Platelets: 211 10*3/uL (ref 150–450)
RBC: 4.99 x10E6/uL (ref 4.14–5.80)
RDW: 13.8 % (ref 12.3–15.4)
WBC: 7.5 10*3/uL (ref 3.4–10.8)

## 2018-06-30 LAB — LIPID PANEL
CHOLESTEROL TOTAL: 223 mg/dL — AB (ref 100–199)
Chol/HDL Ratio: 4.7 ratio (ref 0.0–5.0)
HDL: 47 mg/dL (ref >39)
LDL Calculated: 145 mg/dL — ABNORMAL HIGH (ref 0–99)
Triglycerides: 154 mg/dL — ABNORMAL HIGH (ref 0–149)
VLDL Cholesterol Cal: 31 mg/dL (ref 5–40)

## 2018-06-30 LAB — TSH: TSH: 4.09 u[IU]/mL (ref 0.450–4.500)

## 2018-07-03 ENCOUNTER — Encounter: Payer: Self-pay | Admitting: Family Medicine

## 2018-08-30 ENCOUNTER — Ambulatory Visit (INDEPENDENT_AMBULATORY_CARE_PROVIDER_SITE_OTHER): Payer: Medicare Other

## 2018-08-30 DIAGNOSIS — Z23 Encounter for immunization: Secondary | ICD-10-CM | POA: Diagnosis not present

## 2018-10-17 ENCOUNTER — Encounter: Payer: Self-pay | Admitting: Nurse Practitioner

## 2018-10-17 ENCOUNTER — Ambulatory Visit (INDEPENDENT_AMBULATORY_CARE_PROVIDER_SITE_OTHER): Payer: Medicare Other | Admitting: Nurse Practitioner

## 2018-10-17 VITALS — BP 136/77 | HR 76 | Temp 98.3°F | Ht 71.0 in | Wt 164.4 lb

## 2018-10-17 DIAGNOSIS — R197 Diarrhea, unspecified: Secondary | ICD-10-CM | POA: Insufficient documentation

## 2018-10-17 NOTE — Assessment & Plan Note (Addendum)
Ongoing over past month.  Abdominal exam WNL.  Stop drinking green tea.  Obtain CBC, CMP, TSH today.  Start probiotic tablet, one twice a day, and avoid Imodium if possible.  Consider GI consult if continued ongoing issue and labs WNL.

## 2018-10-17 NOTE — Patient Instructions (Signed)
Probiotic tablet one pill twice a day.   Diarrhea, Adult Diarrhea is when you have loose and water poop (stool) often. Diarrhea can make you feel weak and cause you to get dehydrated. Dehydration can make you tired and thirsty, make you have a dry mouth, and make it so you pee (urinate) less often. Diarrhea often lasts 2-3 days. However, it can last longer if it is a sign of something more serious. It is important to treat your diarrhea as told by your doctor. Follow these instructions at home: Eating and drinking  Follow these recommendations as told by your doctor:  Take an oral rehydration solution (ORS). This is a drink that is sold at pharmacies and stores.  Drink clear fluids, such as: ? Water. ? Ice chips. ? Diluted fruit juice. ? Low-calorie sports drinks.  Eat bland, easy-to-digest foods in small amounts as you are able. These foods include: ? Bananas. ? Applesauce. ? Rice. ? Low-fat (lean) meats. ? Toast. ? Crackers.  Avoid drinking fluids that have a lot of sugar or caffeine in them.  Avoid alcohol.  Avoid spicy or fatty foods.  General instructions   Drink enough fluid to keep your pee (urine) clear or pale yellow.  Wash your hands often. If you cannot use soap and water, use hand sanitizer.  Make sure that all people in your home wash their hands well and often.  Take over-the-counter and prescription medicines only as told by your doctor.  Rest at home while you get better.  Watch your condition for any changes.  Take a warm bath to help with any burning or pain from having diarrhea.  Keep all follow-up visits as told by your doctor. This is important. Contact a doctor if:  You have a fever.  Your diarrhea gets worse.  You have new symptoms.  You cannot keep fluids down.  You feel light-headed or dizzy.  You have a headache.  You have muscle cramps. Get help right away if:  You have chest pain.  You feel very weak or you pass out  (faint).  You have bloody or black poop or poop that look like tar.  You have very bad pain, cramping, or bloating in your belly (abdomen).  You have trouble breathing or you are breathing very quickly.  Your heart is beating very quickly.  Your skin feels cold and clammy.  You feel confused.  You have signs of dehydration, such as: ? Dark pee, hardly any pee, or no pee. ? Cracked lips. ? Dry mouth. ? Sunken eyes. ? Sleepiness. ? Weakness. This information is not intended to replace advice given to you by your health care provider. Make sure you discuss any questions you have with your health care provider. Document Released: 04/26/2008 Document Revised: 05/28/2016 Document Reviewed: 07/15/2015 Elsevier Interactive Patient Education  2018 [employer].

## 2018-10-17 NOTE — Progress Notes (Signed)
BP 136/77 (BP Location: Left Arm, Patient Position: Sitting, Cuff Size: Normal)   Pulse 76   Temp 98.3 F (36.8 C)   Ht 5' 11"  (1.803 m)   Wt 164 lb 6 oz (74.6 kg)   SpO2 96%   BMI 22.93 kg/m    Subjective:    Patient ID: Vincent Koch, male    DOB: 15-Mar-1934, 84 y.o.   MRN: [id number]  HPI: Vincent Koch is a 84 y.o. male presents for diarrhea.  Chief Complaint  Patient presents with  . Diarrhea    difficulity holding stool, hot flashes, abdominal pain    DIARRHEA:       Has history of heat stroke symptoms in the summer.  Initial episodes of diarrhea started before heat stroke during which time he was treated for food poisoning.  Recent episodes, initial at [place] about one month ago.  Started out with face flushing and then "ran" to bathroom and passed bowels (was not loose at this time).  Yesterday had episode, during this episode he did not feel hot/flush; was outside working and episode suddenly came on, was unable to control and had incontinence in his pants (large amount loose stool).  No new medicine, food items, or new cleaning products in house.  They did just start drinking green tea, have advised to cut this out for now.  At baseline he drinks milk without issue.  Has not been keeping food diary.  No blood in stool.  No episodes of N&V or abdominal pain with these episodes. After the episodes his appetite is decreased and he is fatigued.  Back in the 60's he has h/o being followed for "thyroid issues".  No recent abx use or infections.        At baseline his BM pattern, is weekly bowel movement.  If he goes two to three days without BM he will strain.  At this time is not taking any medication for bowel regimen.  Has been taking Imodium recently for diarrhea episodes.  His wife and him report he stool does not have foul odor.  No h/o IBS.  Has GERD he takes Protonix for and h/o dilation for strictures esophagus.  Relevant past medical, surgical, family and social  history reviewed and updated as indicated. Interim medical history since our last visit reviewed. Allergies and medications reviewed and updated.  Review of Systems  Constitutional: Negative for activity change, appetite change, diaphoresis, fatigue, fever and unexpected weight change.  Respiratory: Negative for cough, chest tightness, shortness of breath and wheezing.   Cardiovascular: Negative for chest pain, palpitations and leg swelling.  Gastrointestinal: Positive for diarrhea. Negative for abdominal distention, abdominal pain, blood in stool, constipation, nausea and vomiting.       Denies tenesmus  Endocrine: Negative for cold intolerance, heat intolerance, polydipsia, polyphagia and polyuria.  Genitourinary: Negative for dysuria, frequency, hematuria and urgency.  Musculoskeletal: Negative.   Skin: Negative.   Neurological: Negative for dizziness, syncope, weakness, light-headedness, numbness and headaches.  Psychiatric/Behavioral: Negative.     Per HPI unless specifically indicated above     Objective:    BP 136/77 (BP Location: Left Arm, Patient Position: Sitting, Cuff Size: Normal)   Pulse 76   Temp 98.3 F (36.8 C)   Ht 5' 11"  (1.803 m)   Wt 164 lb 6 oz (74.6 kg)   SpO2 96%   BMI 22.93 kg/m   Wt Readings from Last 3 Encounters:  10/17/18 164 lb 6 oz (74.6  kg)  06/29/18 165 lb 9.6 oz (75.1 kg)  06/07/18 166 lb 9.6 oz (75.6 kg)    Physical Exam  Constitutional: He is oriented to person, place, and time. He appears well-developed and well-nourished.  HENT:  Head: Normocephalic and atraumatic.  Right Ear: Hearing normal. No drainage.  Left Ear: Hearing normal. No drainage.  Mouth/Throat: Uvula is midline, oropharynx is clear and moist and mucous membranes are normal.  Eyes: Pupils are equal, round, and reactive to light. Conjunctivae, EOM and lids are normal. Right eye exhibits no discharge. Left eye exhibits no discharge.  Neck: Trachea normal and normal range of  motion. Neck supple. No JVD present. Carotid bruit is not present. No thyromegaly present.  Cardiovascular: Normal rate, regular rhythm, S1 normal, S2 normal and normal heart sounds. Exam reveals no gallop.  No murmur heard. Pulmonary/Chest: Effort normal and breath sounds normal.  Abdominal: Soft. Bowel sounds are normal. He exhibits no distension, no abdominal bruit and no mass. There is no splenomegaly or hepatomegaly. There is no tenderness. There is no guarding, no CVA tenderness and negative Murphy's sign. No hernia.  Percussion performed with normal tones noted.  Genitourinary: Rectum normal and penis normal.  Musculoskeletal: Normal range of motion.  Lymphadenopathy:    He has no cervical adenopathy.  Neurological: He is alert and oriented to person, place, and time. He has normal reflexes.  Skin: Skin is warm, dry and intact. Capillary refill takes less than 2 seconds. No rash noted.  Psychiatric: He has a normal mood and affect. His behavior is normal. Judgment and thought content normal.  Nursing note and vitals reviewed.   Results for orders placed or performed in visit on 06/29/18  Microscopic Examination  Result Value Ref Range   WBC, UA 0-5 0 - 5 /hpf   RBC, UA None seen 0 - 2 /hpf   Epithelial Cells (non renal) CANCELED    Bacteria, UA None seen None seen/Few  CBC with Differential/Platelet  Result Value Ref Range   WBC 7.5 3.4 - 10.8 x10E3/uL   RBC 4.99 4.14 - 5.80 x10E6/uL   Hemoglobin 15.9 13.0 - 17.7 g/dL   Hematocrit 46.8 37.5 - 51.0 %   MCV 94 79 - 97 fL   MCH 31.9 26.6 - 33.0 pg   MCHC 34.0 31.5 - 35.7 g/dL   RDW 13.8 12.3 - 15.4 %   Platelets 211 150 - 450 x10E3/uL   Neutrophils 62 Not Estab. %   Lymphs 24 Not Estab. %   Monocytes 9 Not Estab. %   Eos 4 Not Estab. %   Basos 1 Not Estab. %   Neutrophils Absolute 4.7 1.4 - 7.0 x10E3/uL   Lymphocytes Absolute 1.8 0.7 - 3.1 x10E3/uL   Monocytes Absolute 0.6 0.1 - 0.9 x10E3/uL   EOS (ABSOLUTE) 0.3 0.0 -  0.4 x10E3/uL   Basophils Absolute 0.1 0.0 - 0.2 x10E3/uL   Immature Granulocytes 0 Not Estab. %   Immature Grans (Abs) 0.0 0.0 - 0.1 x10E3/uL  Comprehensive metabolic panel  Result Value Ref Range   Glucose 97 65 - 99 mg/dL   BUN 14 8 - 27 mg/dL   Creatinine, Ser 1.37 (H) 0.76 - 1.27 mg/dL   GFR calc non Af Amer 48 (L) >59 mL/min/1.73   GFR calc Af Amer 56 (L) >59 mL/min/1.73   BUN/Creatinine Ratio 10 10 - 24   Sodium 140 134 - 144 mmol/L   Potassium 4.3 3.5 - 5.2 mmol/L   Chloride 100 96 -  106 mmol/L   CO2 21 20 - 29 mmol/L   Calcium 9.4 8.6 - 10.2 mg/dL   Total Protein 7.3 6.0 - 8.5 g/dL   Albumin 4.5 3.5 - 4.7 g/dL   Globulin, Total 2.8 1.5 - 4.5 g/dL   Albumin/Globulin Ratio 1.6 1.2 - 2.2   Bilirubin Total 0.7 0.0 - 1.2 mg/dL   Alkaline Phosphatase 74 39 - 117 IU/L   AST 36 0 - 40 IU/L   ALT 37 0 - 44 IU/L  Lipid panel  Result Value Ref Range   Cholesterol, Total 223 (H) 100 - 199 mg/dL   Triglycerides 154 (H) 0 - 149 mg/dL   HDL 47 >39 mg/dL   VLDL Cholesterol Cal 31 5 - 40 mg/dL   LDL Calculated 145 (H) 0 - 99 mg/dL   Chol/HDL Ratio 4.7 0.0 - 5.0 ratio  Urinalysis, Routine w reflex microscopic  Result Value Ref Range   Specific Gravity, UA 1.020 1.005 - 1.030   pH, UA 6.0 5.0 - 7.5   Color, UA Yellow Yellow   Appearance Ur Clear Clear   Leukocytes, UA Trace (A) Negative   Protein, UA Trace (A) Negative/Trace   Glucose, UA Negative Negative   Ketones, UA Negative Negative   RBC, UA Negative Negative   Bilirubin, UA Negative Negative   Urobilinogen, Ur 0.2 0.2 - 1.0 mg/dL   Nitrite, UA Negative Negative   Microscopic Examination See below:   TSH  Result Value Ref Range   TSH 4.090 0.450 - 4.500 uIU/mL      Assessment & Plan:   Problem List Items Addressed This Visit      Other   Diarrhea    Ongoing over past month.  Abdominal exam WNL.  Stop drinking green tea.  Obtain CBC, CMP, TSH today.  Start probiotic tablet, one twice a day, and avoid Imodium if  possible.  Consider GI consult if continued ongoing issue and labs WNL.        Relevant Orders   CBC w/Diff   Thyroid Panel With TSH   Comp Met (CMET)      Time: 25 minutes, >50% spent counseling on lab work and plan of care recommendations  Follow up plan: Return if symptoms worsen or fail to improve.

## 2018-10-18 ENCOUNTER — Telehealth: Payer: Self-pay | Admitting: Nurse Practitioner

## 2018-10-18 DIAGNOSIS — R197 Diarrhea, unspecified: Secondary | ICD-10-CM

## 2018-10-18 LAB — COMPREHENSIVE METABOLIC PANEL
ALBUMIN: 4.5 g/dL (ref 3.5–4.7)
ALK PHOS: 73 IU/L (ref 39–117)
ALT: 45 IU/L — ABNORMAL HIGH (ref 0–44)
AST: 42 IU/L — AB (ref 0–40)
Albumin/Globulin Ratio: 2 (ref 1.2–2.2)
BUN/Creatinine Ratio: 9 — ABNORMAL LOW (ref 10–24)
BUN: 14 mg/dL (ref 8–27)
Bilirubin Total: 0.4 mg/dL (ref 0.0–1.2)
CALCIUM: 9.3 mg/dL (ref 8.6–10.2)
CHLORIDE: 106 mmol/L (ref 96–106)
CO2: 19 mmol/L — ABNORMAL LOW (ref 20–29)
Creatinine, Ser: 1.59 mg/dL — ABNORMAL HIGH (ref 0.76–1.27)
GFR calc non Af Amer: 40 mL/min/{1.73_m2} — ABNORMAL LOW (ref >59)
GFR, EST AFRICAN AMERICAN: 46 mL/min/{1.73_m2} — AB (ref >59)
GLOBULIN, TOTAL: 2.2 g/dL (ref 1.5–4.5)
Glucose: 137 mg/dL — ABNORMAL HIGH (ref 65–99)
POTASSIUM: 4.1 mmol/L (ref 3.5–5.2)
Sodium: 143 mmol/L (ref 134–144)
Total Protein: 6.7 g/dL (ref 6.0–8.5)

## 2018-10-18 LAB — CBC WITH DIFFERENTIAL/PLATELET
BASOS: 1 %
Basophils Absolute: 0.1 10*3/uL (ref 0.0–0.2)
EOS (ABSOLUTE): 0.3 10*3/uL (ref 0.0–0.4)
Eos: 4 %
Hematocrit: 41.4 % (ref 37.5–51.0)
Hemoglobin: 14.3 g/dL (ref 13.0–17.7)
IMMATURE GRANS (ABS): 0 10*3/uL (ref 0.0–0.1)
IMMATURE GRANULOCYTES: 0 %
Lymphocytes Absolute: 1.4 10*3/uL (ref 0.7–3.1)
Lymphs: 20 %
MCH: 31.6 pg (ref 26.6–33.0)
MCHC: 34.5 g/dL (ref 31.5–35.7)
MCV: 91 fL (ref 79–97)
Monocytes Absolute: 0.6 10*3/uL (ref 0.1–0.9)
Monocytes: 8 %
NEUTROS PCT: 67 %
Neutrophils Absolute: 4.6 10*3/uL (ref 1.4–7.0)
PLATELETS: 219 10*3/uL (ref 150–450)
RBC: 4.53 x10E6/uL (ref 4.14–5.80)
RDW: 12.7 % (ref 12.3–15.4)
WBC: 7 10*3/uL (ref 3.4–10.8)

## 2018-10-18 LAB — THYROID PANEL WITH TSH
FREE THYROXINE INDEX: 1.4 (ref 1.2–4.9)
T3 UPTAKE RATIO: 28 % (ref 24–39)
T4 TOTAL: 5 ug/dL (ref 4.5–12.0)
TSH: 4.38 u[IU]/mL (ref 0.450–4.500)

## 2018-10-18 NOTE — Telephone Encounter (Signed)
Reviewed recent labs with patient on telephone.  All within normal limits, except for slight elevation in AST/ALT from his baseline and above norms.  He takes only occasional Tylenol at home, have recommended he not take this at this time.  GI referral placed and patient notified of referral for ongoing loose stool and recent incontinence episode.  He agrees with POC.

## 2018-12-07 ENCOUNTER — Encounter (INDEPENDENT_AMBULATORY_CARE_PROVIDER_SITE_OTHER): Payer: Self-pay

## 2018-12-07 ENCOUNTER — Ambulatory Visit (INDEPENDENT_AMBULATORY_CARE_PROVIDER_SITE_OTHER): Payer: Medicare Other | Admitting: Gastroenterology

## 2018-12-07 ENCOUNTER — Encounter: Payer: Self-pay | Admitting: Gastroenterology

## 2018-12-07 VITALS — BP 120/68 | HR 53 | Ht 71.0 in | Wt 164.2 lb

## 2018-12-07 DIAGNOSIS — R194 Change in bowel habit: Secondary | ICD-10-CM | POA: Diagnosis not present

## 2018-12-07 DIAGNOSIS — R110 Nausea: Secondary | ICD-10-CM | POA: Diagnosis not present

## 2018-12-07 DIAGNOSIS — R748 Abnormal levels of other serum enzymes: Secondary | ICD-10-CM

## 2018-12-07 NOTE — Progress Notes (Signed)
Vincent Koch [address]  [place]  [place], [place] [address]  Main: [phone]  Fax: [phone]   Gastroenterology Consultation  Referring Provider:     Venita Lick, NP Primary Care Physician:  Guadalupe Maple, MD Reason for Consultation:     Diarrhea        HPI:    Chief Complaint  Patient presents with  . New Patient (Initial Visit)    referred by J. Ned Card, NP for diarrhea    Vincent Koch is a 85 y.o. y/o male referred for consultation & management  by Dr. Jeananne Rama, Jeannette How, MD.  Patient reports 65-month history of episodic diarrhea with flushing.  Patient states symptoms started in summer 2019, where initial episode consisted of feeling hot all of a sudden, and having to go inside to cool off followed by multiple loose bowel movements.  Initially he stated he thought this occurred as it was hot outside, but the symptoms have not reoccurred since then.  He has had this episode occur about 3-4 times with the last one being on December 02, 2018 where again he felt hot and then had multiple loose bowel movements that day.  Between these episodes he has regular bowel movements and feels well otherwise.  He has not had any weight loss.  He does report nausea during these episodes as well.  No dysphagia.  Reports 1 episode of bright red blood per rectum that occurred 1 to 2 months ago during this episode.  This has not reoccurred since then.  Denies any family history of colon cancer.  Patient has had history of EGD due to dysphagia in the past but he states dysphagia has not reoccurred since then.  Last EGD, October 2017 with Dr. Allen Norris for dysphagia which reported benign appearing esophageal stenosis dilated.  Hiatal hernia.  Otherwise normal.  Last colonoscopy, 2008 with Dr. Allen Norris for screening.  Diverticulosis and internal hemorrhoids reported, otherwise normal.  Past Medical History:  Diagnosis Date  . Bradycardia   . Chronic kidney disease   . GERD  (gastroesophageal reflux disease)   . Hypertension   . Neuropathy    toes  . Osteoporosis   . Peripheral neuropathy   . Thyroid disease     Past Surgical History:  Procedure Laterality Date  . ESOPHAGEAL DILATION N/A 06/14/2016   Procedure: ESOPHAGOGASTRODUODENOSCOPY (EGD) WITH PROPOFOL with dilation;  Surgeon: Lucilla Lame, MD;  Location: [facility];  Service: Endoscopy;  Laterality: N/A;  . ESOPHAGOGASTRODUODENOSCOPY (EGD) WITH PROPOFOL N/A 07/15/2016   Procedure: ESOPHAGOGASTRODUODENOSCOPY (EGD) WITH DILATION;  Surgeon: Lucilla Lame, MD;  Location: [facility];  Service: Endoscopy;  Laterality: N/A;  . HERNIA REPAIR    . PARATHYROIDECTOMY      Prior to Admission medications   Medication Sig Start Date End Date Taking? Authorizing Provider  acetaminophen (TYLENOL) 500 MG tablet Take 500 mg by mouth every 6 (six) hours as needed.   Yes [provider]  aspirin EC 81 MG tablet Take 81 mg by mouth once.  11/06/12  Yes [provider]  calcium-vitamin D (CALCIUM 500/D) 500-200 MG-UNIT tablet Take 1 tablet by mouth.   Yes [provider]  cyanocobalamin 500 MCG tablet Take 500 mcg by mouth daily.   Yes [provider]  fexofenadine (ALLEGRA) 180 MG tablet Take 180 mg by mouth as needed for allergies.    Yes [provider]  lisinopril (PRINIVIL,ZESTRIL) 10 MG tablet Take 1 tablet (10 mg total)  by mouth daily. 06/29/18  Yes Crissman, Jeannette How, MD  Multiple Vitamin (MULTI-VITAMINS) TABS Take by mouth daily.    Yes [provider]  pantoprazole (PROTONIX) 40 MG tablet Take 1 tablet (40 mg total) by mouth daily. 06/29/18  Yes Guadalupe Maple, MD    Family History  Problem Relation Age of Onset  . Stroke Mother   . Throat cancer Father      Social History   Tobacco Use  . Smoking status: Never Smoker  . Smokeless tobacco: Never Used  Substance Use Topics  . Alcohol use: No  . Drug use: No    Allergies as of  12/07/2018  . (No Known Allergies)    Review of Systems:    All systems reviewed and negative except where noted in HPI.   Physical Exam:  BP 120/68   Pulse (!) 53   Ht 5\' 11"  (1.803 m)   Wt 164 lb 3.2 oz (74.5 kg)   BMI 22.90 kg/m  No LMP for male patient. Psych:  Alert and cooperative. Normal mood and affect. General:   Alert,  Well-developed, well-nourished, pleasant and cooperative in NAD Head:  Normocephalic and atraumatic. Eyes:  Sclera clear, no icterus.   Conjunctiva pink. Ears:  Normal auditory acuity. Nose:  No deformity, discharge, or lesions. Mouth:  No deformity or lesions,oropharynx pink & moist. Neck:  Supple; no masses or thyromegaly. Abdomen:  Normal bowel sounds.  No bruits.  Soft, non-tender and non-distended without masses, hepatosplenomegaly or hernias noted.  No guarding or rebound tenderness.    Msk:  Symmetrical without gross deformities. Good, equal movement & strength bilaterally. Pulses:  Normal pulses noted. Extremities:  No clubbing or edema.  No cyanosis. Neurologic:  Alert and oriented x3;  grossly normal neurologically. Skin:  Intact without significant lesions or rashes. No jaundice. Lymph Nodes:  No significant cervical adenopathy. Psych:  Alert and cooperative. Normal mood and affect.   Labs: CBC    Component Value Date/Time   WBC 7.0 10/17/2018 1526   RBC 4.53 10/17/2018 1526   HGB 14.3 10/17/2018 1526   HCT 41.4 10/17/2018 1526   PLT 219 10/17/2018 1526   MCV 91 10/17/2018 1526   MCH 31.6 10/17/2018 1526   MCHC 34.5 10/17/2018 1526   RDW 12.7 10/17/2018 1526   LYMPHSABS 1.4 10/17/2018 1526   EOSABS 0.3 10/17/2018 1526   BASOSABS 0.1 10/17/2018 1526   CMP     Component Value Date/Time   NA 143 10/17/2018 1526   K 4.1 10/17/2018 1526   CL 106 10/17/2018 1526   CO2 19 (L) 10/17/2018 1526   GLUCOSE 137 (H) 10/17/2018 1526   BUN 14 10/17/2018 1526   CREATININE 1.59 (H) 10/17/2018 1526   CALCIUM 9.3 10/17/2018 1526   PROT 6.7  10/17/2018 1526   ALBUMIN 4.5 10/17/2018 1526   AST 42 (H) 10/17/2018 1526   ALT 45 (H) 10/17/2018 1526   ALKPHOS 73 10/17/2018 1526   BILITOT 0.4 10/17/2018 1526   GFRNONAA 40 (L) 10/17/2018 1526   GFRAA 46 (L) 10/17/2018 1526    Imaging Studies: No results found.  Assessment and Plan:   Vincent Koch is a 85 y.o. y/o male has been referred for diarrhea  Patient has had altered bowel movements, and one episode of bright blood per rectum is His colonoscopy was over 10 years ago  His diarrhea is not consistent with infectious causes as it is very episodic. He has no reason to have malabsorption diarrhea  Given his advanced age and altered bowel habits, with associated bright blood per rectum that occurred once, we discussed colonoscopy to rule out colon malignancy.  Given his episodic diarrhea and not associated nausea with it we also discussed EGD with the colonoscopy to rule out any upper GI lesions.  I have discussed alternative options, risks & benefits,  which include, but are not limited to, bleeding, infection, perforation,respiratory complication & drug reaction.  The patient agrees with this plan & written consent will be obtained.    Given his symptom of feeling hot prior to do these episodes of loose bowel movements, other differentials would be carcinoid syndrome.  If his above work-up is negative, we may need to consider CT abdomen or urine 5 HIAA.  However, at this time given his episode of bright blood per rectum, it is important to rule out colon malignancy  Nov 2019 labs show a mildly elevated AST and ALT.  Will repeat and if still abnormal he will need further work-up.  His CBC is otherwise reassuring.  Dr Vincent Koch  Speech recognition software was used to dictate the above note.

## 2018-12-08 ENCOUNTER — Other Ambulatory Visit: Payer: Self-pay

## 2018-12-08 DIAGNOSIS — R194 Change in bowel habit: Secondary | ICD-10-CM

## 2018-12-08 DIAGNOSIS — R197 Diarrhea, unspecified: Secondary | ICD-10-CM

## 2018-12-08 DIAGNOSIS — R110 Nausea: Secondary | ICD-10-CM

## 2018-12-08 LAB — HEPATIC FUNCTION PANEL
ALT: 39 IU/L (ref 0–44)
AST: 34 IU/L (ref 0–40)
Albumin: 4.4 g/dL (ref 3.5–4.7)
Alkaline Phosphatase: 75 IU/L (ref 39–117)
Bilirubin Total: 0.4 mg/dL (ref 0.0–1.2)
Bilirubin, Direct: 0.15 mg/dL (ref 0.00–0.40)
Total Protein: 6.7 g/dL (ref 6.0–8.5)

## 2018-12-11 ENCOUNTER — Encounter
Admission: RE | Disposition: A | Discharge status: Home / Self Care | Payer: Self-pay | Source: Home / Self Care | Attending: Gastroenterology

## 2018-12-11 ENCOUNTER — Ambulatory Visit: Payer: Medicare Other | Admitting: Anesthesiology

## 2018-12-11 ENCOUNTER — Encounter: Payer: Self-pay | Admitting: Anesthesiology

## 2018-12-11 ENCOUNTER — Ambulatory Visit
Admission: RE | Admit: 2018-12-11 | Discharge: 2018-12-11 | Disposition: A | Discharge status: Home / Self Care | Payer: Medicare Other | Attending: Gastroenterology | Admitting: Gastroenterology

## 2018-12-11 DIAGNOSIS — I890 Lymphedema, not elsewhere classified: Secondary | ICD-10-CM

## 2018-12-11 DIAGNOSIS — R194 Change in bowel habit: Secondary | ICD-10-CM | POA: Diagnosis not present

## 2018-12-11 DIAGNOSIS — E785 Hyperlipidemia, unspecified: Secondary | ICD-10-CM | POA: Diagnosis not present

## 2018-12-11 DIAGNOSIS — G629 Polyneuropathy, unspecified: Secondary | ICD-10-CM | POA: Insufficient documentation

## 2018-12-11 DIAGNOSIS — Z79899 Other long term (current) drug therapy: Secondary | ICD-10-CM | POA: Diagnosis not present

## 2018-12-11 DIAGNOSIS — K5730 Diverticulosis of large intestine without perforation or abscess without bleeding: Secondary | ICD-10-CM | POA: Diagnosis not present

## 2018-12-11 DIAGNOSIS — N183 Chronic kidney disease, stage 3 (moderate): Secondary | ICD-10-CM | POA: Diagnosis not present

## 2018-12-11 DIAGNOSIS — D123 Benign neoplasm of transverse colon: Secondary | ICD-10-CM | POA: Insufficient documentation

## 2018-12-11 DIAGNOSIS — K449 Diaphragmatic hernia without obstruction or gangrene: Secondary | ICD-10-CM | POA: Insufficient documentation

## 2018-12-11 DIAGNOSIS — R197 Diarrhea, unspecified: Secondary | ICD-10-CM | POA: Diagnosis not present

## 2018-12-11 DIAGNOSIS — E079 Disorder of thyroid, unspecified: Secondary | ICD-10-CM | POA: Diagnosis not present

## 2018-12-11 DIAGNOSIS — D131 Benign neoplasm of stomach: Secondary | ICD-10-CM | POA: Diagnosis not present

## 2018-12-11 DIAGNOSIS — I129 Hypertensive chronic kidney disease with stage 1 through stage 4 chronic kidney disease, or unspecified chronic kidney disease: Secondary | ICD-10-CM | POA: Diagnosis not present

## 2018-12-11 DIAGNOSIS — K219 Gastro-esophageal reflux disease without esophagitis: Secondary | ICD-10-CM | POA: Insufficient documentation

## 2018-12-11 DIAGNOSIS — K317 Polyp of stomach and duodenum: Secondary | ICD-10-CM | POA: Insufficient documentation

## 2018-12-11 DIAGNOSIS — Z7982 Long term (current) use of aspirin: Secondary | ICD-10-CM | POA: Insufficient documentation

## 2018-12-11 DIAGNOSIS — K5790 Diverticulosis of intestine, part unspecified, without perforation or abscess without bleeding: Secondary | ICD-10-CM | POA: Diagnosis not present

## 2018-12-11 DIAGNOSIS — K635 Polyp of colon: Secondary | ICD-10-CM | POA: Diagnosis not present

## 2018-12-11 DIAGNOSIS — N189 Chronic kidney disease, unspecified: Secondary | ICD-10-CM | POA: Insufficient documentation

## 2018-12-11 DIAGNOSIS — R857 Abnormal histological findings in specimens from digestive organs and abdominal cavity: Secondary | ICD-10-CM | POA: Diagnosis not present

## 2018-12-11 DIAGNOSIS — R110 Nausea: Secondary | ICD-10-CM | POA: Diagnosis not present

## 2018-12-11 HISTORY — PX: COLONOSCOPY WITH PROPOFOL: SHX5780

## 2018-12-11 HISTORY — PX: ESOPHAGOGASTRODUODENOSCOPY (EGD) WITH PROPOFOL: SHX5813

## 2018-12-11 SURGERY — COLONOSCOPY WITH PROPOFOL
Anesthesia: General

## 2018-12-11 MED ORDER — PROPOFOL 500 MG/50ML IV EMUL
INTRAVENOUS | Status: DC | PRN
  Administered 2018-12-11: 125 ug/kg/min via INTRAVENOUS

## 2018-12-11 MED ORDER — SODIUM CHLORIDE 0.9 % IV SOLN
INTRAVENOUS | Status: DC
  Administered 2018-12-11: 15:00:00 via INTRAVENOUS

## 2018-12-11 MED ORDER — PROPOFOL 500 MG/50ML IV EMUL
INTRAVENOUS | Status: AC
  Filled 2018-12-11: qty 50

## 2018-12-11 MED ORDER — PROPOFOL 10 MG/ML IV BOLUS
INTRAVENOUS | Status: DC | PRN
  Administered 2018-12-11 (×2): 50 mg via INTRAVENOUS

## 2018-12-11 NOTE — Op Note (Signed)
[facility]enterology Patient Name: Vincent Koch Procedure Date: 12/11/2018 2:15 PM MRN: [id number] Account #: [id number] Date of Birth: [birth year] Admit Type: Outpatient Age: 85 Room: [facility] ENDO ROOM 2 Gender: Male Note Status: Finalized Procedure:            Upper GI endoscopy Indications:          Diarrhea, Nausea Providers:            Karman Veney B. Bonna Gains MD, MD Medicines:            Monitored Anesthesia Care Complications:        No immediate complications. Procedure:            Pre-Anesthesia Assessment:                       - Prior to the procedure, a History and Physical was                        performed, and patient medications, allergies and                        sensitivities were reviewed. The patient's tolerance of                        previous anesthesia was reviewed.                       - The risks and benefits of the procedure and the                        sedation options and risks were discussed with the                        patient. All questions were answered and informed                        consent was obtained.                       - Patient identification and proposed procedure were                        verified prior to the procedure by the physician, the                        nurse, the anesthesiologist, the anesthetist and the                        technician. The procedure was verified in the procedure                        room.                       - ASA Grade Assessment: II - A patient with mild                        systemic disease.                       After obtaining informed consent, the endoscope was  passed under direct vision. Throughout the procedure,                        the patient's blood pressure, pulse, and oxygen                        saturations were monitored continuously. The Endoscope                        was introduced through the mouth, and advanced to  the                        third part of duodenum. The upper GI endoscopy was                        accomplished with ease. The patient tolerated the                        procedure well. Findings:      The examined esophagus was normal.      The entire examined stomach was normal. Biopsies were obtained in the       gastric body, at the incisura and in the gastric antrum with cold       forceps for histology. Biopsies were taken with a cold forceps for       Helicobacter pylori testing.      A hiatal hernia was present.      A few 2 to 4 mm sessile polyps with no bleeding and no stigmata of       recent bleeding were found in the gastric body. Biopsies were taken with       a cold forceps for histology.      The duodenal bulb was normal.      Lymphangiectasia was present in the second portion of the duodenum.      Patchy mild mucosal changes characterized by white specks were found in       the second portion of the duodenum and in the third portion of the       duodenum. Biopsies were taken with a cold forceps for histology. Impression:           - Normal esophagus.                       - Normal stomach. Biopsied.                       - Hiatal hernia.                       - A few gastric polyps. Biopsied.                       - Normal duodenal bulb.                       - Duodenal mucosal lymphangiectasia.                       - Mucosal changes in the duodenum. Biopsied.                       - Biopsies were obtained in the gastric body, at the  incisura and in the gastric antrum. Recommendation:       - Discharge patient to home (with escort).                       - Advance diet as tolerated.                       - Continue present medications.                       - Patient has a contact number available for                        emergencies. The signs and symptoms of potential                        delayed complications were discussed with the  patient.                        Return to normal activities tomorrow. Written discharge                        instructions were provided to the patient.                       - Discharge patient to home (with escort).                       - The findings and recommendations were discussed with                        the patient.                       - The findings and recommendations were discussed with                        the patient's family. Procedure Code(s):    --- Professional ---                       [phone], Esophagogastroduodenoscopy, flexible, transoral;                        with biopsy, single or multiple Diagnosis Code(s):    --- Professional ---                       K44.9, Diaphragmatic hernia without obstruction or                        gangrene                       K31.7, Polyp of stomach and duodenum                       I89.0, Lymphedema, not elsewhere classified                       K31.89, Other diseases of stomach and duodenum                       R19.7, Diarrhea, unspecified  R11.0, Nausea CPT copyright 2018 American Medical Association. All rights reserved. The codes documented in this report are preliminary and upon coder review may  be revised to meet current compliance requirements.  [name], MD Margretta Sidle B. Bonna Gains MD, MD 12/11/2018 2:44:17 PM This report has been signed electronically. Number of Addenda: 0 Note Initiated On: 12/11/2018 2:15 PM Estimated Blood Loss: Estimated blood loss: none.      [facility]

## 2018-12-11 NOTE — H&P (Signed)
Vincent Antigua, MD [address] [place], [place], [place], [address] [address], [place], [place], [place], [address] Phone: [phone]  Fax: [phone]  Primary Care Physician:  Guadalupe Maple, MD   Pre-Procedure History & Physical: HPI:  Vincent Koch is a 85 y.o. male is here for a colonoscopy and EGD.   Past Medical History:  Diagnosis Date  . Bradycardia   . Chronic kidney disease   . GERD (gastroesophageal reflux disease)   . Hypertension   . Neuropathy    toes  . Osteoporosis   . Peripheral neuropathy   . Thyroid disease     Past Surgical History:  Procedure Laterality Date  . ESOPHAGEAL DILATION N/A 06/14/2016   Procedure: ESOPHAGOGASTRODUODENOSCOPY (EGD) WITH PROPOFOL with dilation;  Surgeon: Lucilla Lame, MD;  Location: [facility];  Service: Endoscopy;  Laterality: N/A;  . ESOPHAGOGASTRODUODENOSCOPY (EGD) WITH PROPOFOL N/A 07/15/2016   Procedure: ESOPHAGOGASTRODUODENOSCOPY (EGD) WITH DILATION;  Surgeon: Lucilla Lame, MD;  Location: [place];  Service: Endoscopy;  Laterality: N/A;  . HERNIA REPAIR    . PARATHYROIDECTOMY      Prior to Admission medications   Medication Sig Start Date End Date Taking? Authorizing Provider  aspirin EC 81 MG tablet Take 81 mg by mouth once.  11/06/12  Yes [provider]  calcium-vitamin D (CALCIUM 500/D) 500-200 MG-UNIT tablet Take 1 tablet by mouth.   Yes [provider]  cyanocobalamin 500 MCG tablet Take 500 mcg by mouth daily.   Yes [provider]  fexofenadine (ALLEGRA) 180 MG tablet Take 180 mg by mouth as needed for allergies.    Yes [provider]  lisinopril (PRINIVIL,ZESTRIL) 10 MG tablet Take 1 tablet (10 mg total) by mouth daily. 06/29/18  Yes Crissman, Jeannette How, MD  Multiple Vitamin (MULTI-VITAMINS) TABS Take by mouth daily.    Yes [provider]  pantoprazole (PROTONIX) 40 MG tablet Take 1 tablet (40 mg total) by mouth daily. 06/29/18  Yes  Crissman, Jeannette How, MD  acetaminophen (TYLENOL) 500 MG tablet Take 500 mg by mouth every 6 (six) hours as needed.    [provider]    Allergies as of 12/08/2018  . (No Known Allergies)    Family History  Problem Relation Age of Onset  . Stroke Mother   . Throat cancer Father     Social History   Socioeconomic History  . Marital status: Married    Spouse name: Not on file  . Number of children: Not on file  . Years of education: Not on file  . Highest education level: Master's degree (e.g., MA, MS, MEng, MEd, MSW, MBA)  Occupational History  . Not on file  Social Needs  . Financial resource strain: Not hard at all  . Food insecurity:    Worry: Never true    Inability: Never true  . Transportation needs:    Medical: No    Non-medical: No  Tobacco Use  . Smoking status: Never Smoker  . Smokeless tobacco: Never Used  Substance and Sexual Activity  . Alcohol use: Never    Frequency: Never  . Drug use: Never  . Sexual activity: Not on file  Lifestyle  . Physical activity:    Days per week: 0 days    Minutes per session: 0 min  . Stress: Not at all  Relationships  . Social connections:    Talks on phone: More than three times a week    Gets together: More than  three times a week    Attends religious service: More than 4 times per year    Active member of club or organization: No    Attends meetings of clubs or organizations: Never    Relationship status: Married  . Intimate partner violence:    Fear of current or ex partner: No    Emotionally abused: No    Physically abused: No    Forced sexual activity: No  Other Topics Concern  . Not on file  Social History Narrative  . Not on file    Review of Systems: See HPI, otherwise negative ROS  Physical Exam: BP 123/60   Pulse 77   Temp 98 F (36.7 C) (Tympanic)   Resp 18   Ht 5\' 9"  (1.753 m)   Wt 74.4 kg   SpO2 98%   BMI 24.22 kg/m  General:   Alert,  pleasant and cooperative in NAD Head:   Normocephalic and atraumatic. Neck:  Supple; no masses or thyromegaly. Lungs:  Clear throughout to auscultation, normal respiratory effort.    Heart:  +S1, +S2, Regular rate and rhythm, No edema. Abdomen:  Soft, nontender and nondistended. Normal bowel sounds, without guarding, and without rebound.   Neurologic:  Alert and  oriented x4;  grossly normal neurologically.  Impression/Plan: Vincent Koch is here for a colonoscopy and EGD for altered bowel habits, nausea, diarrhea.  Risks, benefits, limitations, and alternatives regarding the procedures have been reviewed with the patient.  Questions have been answered.  All parties agreeable.   Vincent Manifold, MD  12/11/2018, 2:18 PM

## 2018-12-11 NOTE — Anesthesia Preprocedure Evaluation (Signed)
Anesthesia Evaluation  Patient identified by MRN, date of birth, ID band Patient awake    Reviewed: Allergy & Precautions, NPO status , Patient's Chart, lab work & pertinent test results, reviewed documented beta blocker date and time   Airway Mallampati: II  TM Distance: >3 FB     Dental  (+) Chipped   Pulmonary           Cardiovascular hypertension, Pt. on medications + Peripheral Vascular Disease       Neuro/Psych  Neuromuscular disease    GI/Hepatic GERD  ,  Endo/Other    Renal/GU Renal disease     Musculoskeletal   Abdominal   Peds  Hematology   Anesthesia Other Findings   Reproductive/Obstetrics                             Anesthesia Physical Anesthesia Plan  ASA: III  Anesthesia Plan: General   Post-op Pain Management:    Induction: Intravenous  PONV Risk Score and Plan:   Airway Management Planned:   Additional Equipment:   Intra-op Plan:   Post-operative Plan:   Informed Consent: I have reviewed the patients History and Physical, chart, labs and discussed the procedure including the risks, benefits and alternatives for the proposed anesthesia with the patient or authorized representative who has indicated his/her understanding and acceptance.       Plan Discussed with: CRNA  Anesthesia Plan Comments:         Anesthesia Quick Evaluation

## 2018-12-11 NOTE — Transfer of Care (Signed)
Immediate Anesthesia Transfer of Care Note  Patient: Vincent Koch  Procedure(s) Performed: COLONOSCOPY WITH PROPOFOL (N/A ) ESOPHAGOGASTRODUODENOSCOPY (EGD) WITH PROPOFOL (N/A )  Patient Location: PACU  Anesthesia Type:General  Level of Consciousness: drowsy and patient cooperative  Airway & Oxygen Therapy: Patient Spontanous Breathing and Patient connected to nasal cannula oxygen  Post-op Assessment: Report given to RN and Post -op Vital signs reviewed and stable  Post vital signs: Reviewed and stable  Last Vitals:  Vitals Value Taken Time  BP 106/58 12/11/2018  3:09 PM  Temp 36.2 C 12/11/2018  3:09 PM  Pulse 72 12/11/2018  3:09 PM  Resp 16 12/11/2018  3:09 PM  SpO2 100 % 12/11/2018  3:09 PM    Last Pain:  Vitals:   12/11/18 1509  TempSrc: Tympanic  PainSc: Asleep         Complications: No apparent anesthesia complications

## 2018-12-11 NOTE — Op Note (Signed)
[facility] Gastroenterology Patient Name: Vincent Koch Procedure Date: 12/11/2018 2:14 PM MRN: [id number] Account #: [id number] Date of Birth: 12-05-1933 Admit Type: Outpatient Age: 85 Room: [facility] ENDO ROOM 2 Gender: Male Note Status: Finalized Procedure:            Colonoscopy Indications:          Diarrhea, Change in bowel habits Providers:            Berdella Bacot B. Bonna Gains MD, MD Medicines:            Monitored Anesthesia Care Complications:        No immediate complications. Procedure:            Pre-Anesthesia Assessment:                       - ASA Grade Assessment: II - A patient with mild                        systemic disease.                       - Prior to the procedure, a History and Physical was                        performed, and patient medications, allergies and                        sensitivities were reviewed. The patient's tolerance of                        previous anesthesia was reviewed.                       - The risks and benefits of the procedure and the                        sedation options and risks were discussed with the                        patient. All questions were answered and informed                        consent was obtained.                       - Patient identification and proposed procedure were                        verified prior to the procedure by the physician, the                        nurse, the anesthesiologist, the anesthetist and the                        technician. The procedure was verified in the procedure                        room.                       After obtaining informed consent, the colonoscope was  passed under direct vision. Throughout the procedure,                        the patient's blood pressure, pulse, and oxygen                        saturations were monitored continuously. The                        Colonoscope was introduced through the anus and                         advanced to the the terminal ileum. The colonoscopy was                        performed with ease. The patient tolerated the                        procedure well. The quality of the bowel preparation                        was good. Findings:      The perianal and digital rectal examinations were normal.      A 5 mm polyp was found in the transverse colon. The polyp was sessile.       The polyp was removed with a cold biopsy forceps. Resection and       retrieval were complete.      Multiple diverticula were found in the sigmoid colon.      The exam was otherwise without abnormality.      The rectum, sigmoid colon, descending colon, transverse colon, ascending       colon, cecum and ileum appeared normal. Biopsies for histology were       taken with a cold forceps from the cecum, ascending colon, transverse       colon, descending colon and sigmoid colon for evaluation of microscopic       colitis.      The retroflexed view of the distal rectum and anal verge was normal and       showed no anal or rectal abnormalities. Impression:           - One 5 mm polyp in the transverse colon, removed with                        a cold biopsy forceps. Resected and retrieved.                       - Diverticulosis in the sigmoid colon.                       - The examination was otherwise normal.                       - The rectum, sigmoid colon, descending colon,                        transverse colon, ascending colon, cecum and terminal                        ileum are normal. Biopsied.                       -  The distal rectum and anal verge are normal on                        retroflexion view. Recommendation:       - Discharge patient to home (with escort).                       - Advance diet as tolerated.                       - Continue present medications.                       - Await pathology results.                       - The findings and recommendations  were discussed with                        the patient.                       - The findings and recommendations were discussed with                        the patient's family.                       - Return to primary care physician as previously                        scheduled.                       - High fiber diet. Procedure Code(s):    --- Professional ---                       [phone], Colonoscopy, flexible; with biopsy, single or                        multiple Diagnosis Code(s):    --- Professional ---                       D12.3, Benign neoplasm of transverse colon (hepatic                        flexure or splenic flexure)                       R19.7, Diarrhea, unspecified                       R19.4, Change in bowel habit                       K57.30, Diverticulosis of large intestine without                        perforation or abscess without bleeding CPT copyright 2018 American Medical Association. All rights reserved. The codes documented in this report are preliminary and upon coder review may  be revised to meet current compliance requirements.  [name], MD Margretta Sidle B. Bonna Gains MD, MD 12/11/2018 3:08:35 PM This report has been signed electronically.  Number of Addenda: 0 Note Initiated On: 12/11/2018 2:14 PM Scope Withdrawal Time: 0 hours 12 minutes 33 seconds  Total Procedure Duration: 0 hours 18 minutes 33 seconds  Estimated Blood Loss: Estimated blood loss: none.      [facility]

## 2018-12-11 NOTE — Anesthesia Post-op Follow-up Note (Signed)
Anesthesia QCDR form completed.        

## 2018-12-12 ENCOUNTER — Encounter: Payer: Self-pay | Admitting: Gastroenterology

## 2018-12-13 LAB — SURGICAL PATHOLOGY

## 2018-12-13 NOTE — Anesthesia Postprocedure Evaluation (Signed)
Anesthesia Post Note  Patient: JERREN FLINCHBAUGH  Procedure(s) Performed: COLONOSCOPY WITH PROPOFOL (N/A ) ESOPHAGOGASTRODUODENOSCOPY (EGD) WITH PROPOFOL (N/A )  Patient location during evaluation: Endoscopy Anesthesia Type: General Level of consciousness: awake and alert Pain management: pain level controlled Vital Signs Assessment: post-procedure vital signs reviewed and stable Respiratory status: spontaneous breathing, nonlabored ventilation and respiratory function stable Cardiovascular status: blood pressure returned to baseline and stable Postop Assessment: no apparent nausea or vomiting Anesthetic complications: no     Last Vitals:  Vitals:   12/11/18 1519 12/11/18 1529  BP: 120/71 128/66  Pulse: 69 68  Resp: 16 16  Temp:    SpO2: 98% 97%    Last Pain:  Vitals:   12/11/18 1529  TempSrc:   PainSc: 0-No pain                 Alphonsus Sias

## 2018-12-14 ENCOUNTER — Encounter: Payer: Self-pay | Admitting: Gastroenterology

## 2018-12-18 DIAGNOSIS — M818 Other osteoporosis without current pathological fracture: Secondary | ICD-10-CM | POA: Diagnosis not present

## 2018-12-25 ENCOUNTER — Ambulatory Visit: Payer: Medicare Other | Admitting: Gastroenterology

## 2018-12-25 NOTE — Addendum Note (Signed)
Addendum  created 12/25/18 1435 by Alphonsus Sias, MD   Attestation recorded in [place], [place] filed

## 2018-12-27 DIAGNOSIS — M810 Age-related osteoporosis without current pathological fracture: Secondary | ICD-10-CM | POA: Diagnosis not present

## 2018-12-27 DIAGNOSIS — N183 Chronic kidney disease, stage 3 (moderate): Secondary | ICD-10-CM | POA: Diagnosis not present

## 2018-12-28 ENCOUNTER — Ambulatory Visit (INDEPENDENT_AMBULATORY_CARE_PROVIDER_SITE_OTHER): Payer: Medicare Other | Admitting: Gastroenterology

## 2018-12-28 VITALS — BP 108/62 | HR 58 | Ht 71.0 in | Wt 166.4 lb

## 2018-12-28 DIAGNOSIS — E340 Carcinoid syndrome: Secondary | ICD-10-CM

## 2018-12-28 DIAGNOSIS — R109 Unspecified abdominal pain: Secondary | ICD-10-CM

## 2018-12-28 DIAGNOSIS — R197 Diarrhea, unspecified: Secondary | ICD-10-CM | POA: Diagnosis not present

## 2018-12-28 NOTE — Progress Notes (Signed)
Vincent Antigua, MD [address]  [place]  [place], Vincent Koch  Main: [phone]  Fax: [phone]   Primary Care Physician: Guadalupe Maple, MD   Chief complaint: Follow-up for diarrhea  HPI: Vincent Koch is a 85 y.o. male here for follow-up of episodic diarrhea.  Reports last episode of diarrhea occurred 2 to 3 days ago when he had 4-5 loose bowel movements without blood.  No nausea or vomiting.  Reports flushing with that episode.  These episodes occur about once a week.  Has not seen any diet triggers to these episodes.  Patient does admit to consuming milk daily but has not noted any bloating or diarrhea every time he consumes dairy.  He underwent EGD and colonoscopy in December 19, 2018 due to diarrhea and nausea.  This showed a hiatal hernia, few gastric polyps, duodenal lymphangiectasia, and otherwise normal exam.  Colonoscopy with one 5 mm transverse colon polyp removed.  Diverticulosis noted.  Otherwise normal.  Pathology showed dilated lactulose consistent with lymphangiectasia in the small bowel.  No H. pylori on gastric biopsies.  Stomach polyps showed foveolar hyperplasia and gastric hyperplastic polyp.  Random colon biopsies did not show microscopic colitis.  Polyp was tubular adenoma.  Current Outpatient Medications  Medication Sig Dispense Refill  . acetaminophen (TYLENOL) 500 MG tablet Take 500 mg by mouth every 6 (six) hours as needed.    Marland Kitchen aspirin EC 81 MG tablet Take 81 mg by mouth once.     . calcium-vitamin D (CALCIUM 500/D) 500-200 MG-UNIT tablet Take 1 tablet by mouth.    . cyanocobalamin 500 MCG tablet Take 500 mcg by mouth daily.    . fexofenadine (ALLEGRA) 180 MG tablet Take 180 mg by mouth as needed for allergies.     Marland Kitchen lisinopril (PRINIVIL,ZESTRIL) 10 MG tablet Take 1 tablet (10 mg total) by mouth daily. 90 tablet 4  . Multiple Vitamin (MULTI-VITAMINS) TABS Take by mouth daily.     . pantoprazole (PROTONIX) 40 MG tablet Take 1  tablet (40 mg total) by mouth daily. 90 tablet 4   No current facility-administered medications for this visit.     Allergies as of 12/28/2018  . (No Known Allergies)    ROS:  General: Negative for anorexia, weight loss, fever, chills, fatigue, weakness. ENT: Negative for hoarseness, difficulty swallowing , nasal congestion. CV: Negative for chest pain, angina, palpitations, dyspnea on exertion, peripheral edema.  Respiratory: Negative for dyspnea at rest, dyspnea on exertion, cough, sputum, wheezing.  GI: See history of present illness. GU:  Negative for dysuria, hematuria, urinary incontinence, urinary frequency, nocturnal urination.  Endo: Negative for unusual weight change.    Physical Examination: Vitals:   12/28/18 1315  BP: 108/62  Pulse: (!) 58  Weight: 166 lb 6.4 oz (75.5 kg)  Height: 5\' 11"  (1.803 m)    General: Well-nourished, well-developed in no acute distress.  Eyes: No icterus. Conjunctivae pink. Mouth: Oropharyngeal mucosa moist and pink , no lesions erythema or exudate. Neck: Supple, Trachea midline Abdomen: Bowel sounds are normal, nontender, nondistended, no hepatosplenomegaly or masses, no abdominal bruits or hernia , no rebound or guarding.   Extremities: No lower extremity edema. No clubbing or deformities. Neuro: Alert and oriented x 3.  Grossly intact. Skin: Warm and dry, no jaundice.   Psych: Alert and cooperative, normal mood and affect.   Labs: CMP     Component Value Date/Time   NA 143 10/17/2018 1526   K 4.1 10/17/2018 1526  CL 106 10/17/2018 1526   CO2 19 (L) 10/17/2018 1526   GLUCOSE 137 (H) 10/17/2018 1526   BUN 14 10/17/2018 1526   CREATININE 1.59 (H) 10/17/2018 1526   CALCIUM 9.3 10/17/2018 1526   PROT 6.7 12/07/2018 1426   ALBUMIN 4.4 12/07/2018 1426   AST 34 12/07/2018 1426   ALT 39 12/07/2018 1426   ALKPHOS 75 12/07/2018 1426   BILITOT 0.4 12/07/2018 1426   GFRNONAA 40 (L) 10/17/2018 1526   GFRAA 46 (L) 10/17/2018 1526    Lab Results  Component Value Date   WBC 7.0 10/17/2018   HGB 14.3 10/17/2018   HCT 41.4 10/17/2018   MCV 91 10/17/2018   PLT 219 10/17/2018    Imaging Studies: No results found.  Assessment and Plan:   Vincent Koch is a 85 y.o. y/o male here for follow-up of episodic diarrhea with flushing  Patient's EGD and colonoscopy did not reveal a source of his symptoms His episodic diarrhea with flushing is suggestive of possible carcinoid syndrome with no lesions noted on his endoscopy. Due to continuing symptoms, further work-up is indicated  We will obtain 24-hour urine for 5-HIAA level We will also obtain CT abdomen pelvis to identify any neuroendocrine tumor lesions  Likelihood of infectious diarrhea is low at this point, but due to continuing symptoms would like to rule this out and thus will obtain GI panel We will also obtain pancreatic elastase to rule out fat malabsorption  His liver enzymes were repeated on last visits and had normalized from before, therefore no repeat work-up needed for that.  CBC otherwise reassuring.  We also discussed diet triggers and asked patient to maintain a food diary or to try to identify diet triggers. He consumes a lot of dairy, so have asked him to completely stop any dairy intake for 2 to 3 weeks and see if his symptoms resolve.  If work-up for carcinoid syndrome is negative, can consider food allergen blood testing with [facility].  Dr Vincent Koch

## 2018-12-29 ENCOUNTER — Encounter: Payer: Self-pay | Admitting: Gastroenterology

## 2018-12-29 LAB — BUN+CREAT
BUN/Creatinine Ratio: 11 (ref 10–24)
BUN: 15 mg/dL (ref 8–27)
Creatinine, Ser: 1.36 mg/dL — ABNORMAL HIGH (ref 0.76–1.27)
GFR calc non Af Amer: 48 mL/min/{1.73_m2} — ABNORMAL LOW (ref >59)
GFR, EST AFRICAN AMERICAN: 56 mL/min/{1.73_m2} — AB (ref >59)

## 2018-12-29 LAB — CHROMOGRANIN A: Chromogranin A (ng/mL): 404.8 ng/mL — ABNORMAL HIGH (ref 0.0–101.8)

## 2019-01-01 ENCOUNTER — Telehealth: Payer: Self-pay

## 2019-01-01 ENCOUNTER — Other Ambulatory Visit: Payer: Self-pay

## 2019-01-01 DIAGNOSIS — R109 Unspecified abdominal pain: Secondary | ICD-10-CM

## 2019-01-01 DIAGNOSIS — R197 Diarrhea, unspecified: Secondary | ICD-10-CM | POA: Diagnosis not present

## 2019-01-01 DIAGNOSIS — E340 Carcinoid syndrome: Secondary | ICD-10-CM | POA: Diagnosis not present

## 2019-01-01 NOTE — Telephone Encounter (Signed)
[name] (wife) notified of CT scheduled for 01/05/2019 at the [facility] outpatient imaging center. Time 8:30 am and arrival time 8:15 am. Nothing to eat or drink 4 hours before procedure.  After speaking with radiologist this am they should be able to see what they need without contrast. Pt and Dr. Bonna Gains aware.

## 2019-01-03 ENCOUNTER — Encounter: Payer: Self-pay | Admitting: Family Medicine

## 2019-01-03 ENCOUNTER — Ambulatory Visit (INDEPENDENT_AMBULATORY_CARE_PROVIDER_SITE_OTHER): Payer: Medicare Other | Admitting: Family Medicine

## 2019-01-03 VITALS — BP 130/71 | HR 67 | Temp 97.7°F | Wt 165.8 lb

## 2019-01-03 DIAGNOSIS — I10 Essential (primary) hypertension: Secondary | ICD-10-CM | POA: Diagnosis not present

## 2019-01-03 DIAGNOSIS — R197 Diarrhea, unspecified: Secondary | ICD-10-CM

## 2019-01-03 DIAGNOSIS — K219 Gastro-esophageal reflux disease without esophagitis: Secondary | ICD-10-CM

## 2019-01-03 DIAGNOSIS — N183 Chronic kidney disease, stage 3 unspecified: Secondary | ICD-10-CM

## 2019-01-03 NOTE — Progress Notes (Signed)
BP 130/71   Pulse 67   Temp 97.7 F (36.5 C) (Oral)   Wt 165 lb 12.8 oz (75.2 kg)   SpO2 97%   BMI 23.12 kg/m    Subjective:    Patient ID: Vincent Koch, male    DOB: Aug 09, 1934, 85 y.o.   MRN: [id number]  HPI: Vincent Koch is a 85 y.o. male  Chief Complaint  Patient presents with  . Hypertension   Patient with a lots of work-up going on and scheduled.  Has continued abdominal discomfort with occasional diarrhea has blood work and CT scan planned later this week. Patient was scheduled for BMP here today on chart review patient's had the equivalent done earlier this month which was stable and showing improved renal function. Blood pressure doing well with no complaints. Takes Protonix without problems.  Relevant past medical, surgical, family and social history reviewed and updated as indicated. Interim medical history since our last visit reviewed. Allergies and medications reviewed and updated.  Review of Systems  Constitutional: Negative.   Respiratory: Negative.   Cardiovascular: Negative.     Per HPI unless specifically indicated above     Objective:    BP 130/71   Pulse 67   Temp 97.7 F (36.5 C) (Oral)   Wt 165 lb 12.8 oz (75.2 kg)   SpO2 97%   BMI 23.12 kg/m   Wt Readings from Last 3 Encounters:  01/03/19 165 lb 12.8 oz (75.2 kg)  12/28/18 166 lb 6.4 oz (75.5 kg)  12/11/18 164 lb (74.4 kg)    Physical Exam Constitutional:      Appearance: He is well-developed.  HENT:     Head: Normocephalic and atraumatic.  Eyes:     Conjunctiva/sclera: Conjunctivae normal.  Neck:     Musculoskeletal: Normal range of motion.  Cardiovascular:     Rate and Rhythm: Normal rate and regular rhythm.     Heart sounds: Normal heart sounds.  Pulmonary:     Effort: Pulmonary effort is normal.     Breath sounds: Normal breath sounds.  Musculoskeletal: Normal range of motion.  Skin:    Findings: No erythema.  Neurological:     Mental Status: He is alert  and oriented to person, place, and time.  Psychiatric:        Behavior: Behavior normal.        Thought Content: Thought content normal.        Judgment: Judgment normal.     Results for orders placed or performed in visit on 12/28/18  Chromogranin A  Result Value Ref Range   Chromogranin A (ng/mL) 404.8 (H) 0.0 - 101.8 ng/mL  BUN+Creat  Result Value Ref Range   BUN 15 8 - 27 mg/dL   Creatinine, Ser 1.36 (H) 0.76 - 1.27 mg/dL   GFR calc non Af Amer 48 (L) >59 mL/min/1.73   GFR calc Af Amer 56 (L) >59 mL/min/1.73   BUN/Creatinine Ratio 11 10 - 24      Assessment & Plan:   Problem List Items Addressed This Visit      Cardiovascular and Mediastinum   Hypertension - Primary    The current medical regimen is effective;  continue present plan and medications.         Digestive   Gastroesophageal reflux disease without esophagitis    The current medical regimen is effective;  continue present plan and medications.         Genitourinary   CKD (chronic kidney disease) stage  3, GFR 30-59 ml/min (HCC)    The current medical regimen is effective;  continue present plan and medications.         Other   Diarrhea    Followed by GI with work-up in progress          Follow up plan: Return in about 6 months (around 07/04/2019) for Physical Exam.

## 2019-01-03 NOTE — Assessment & Plan Note (Signed)
Followed by GI with work-up in progress

## 2019-01-03 NOTE — Assessment & Plan Note (Signed)
The current medical regimen is effective;  continue present plan and medications.  

## 2019-01-04 ENCOUNTER — Telehealth: Payer: Self-pay | Admitting: Gastroenterology

## 2019-01-04 DIAGNOSIS — M810 Age-related osteoporosis without current pathological fracture: Secondary | ICD-10-CM | POA: Diagnosis not present

## 2019-01-04 LAB — GI PROFILE, STOOL, PCR

## 2019-01-04 LAB — SPECIMEN STATUS REPORT

## 2019-01-04 LAB — PANCREATIC ELASTASE, FECAL: Pancreatic Elastase, Fecal: 274 ug Elast./g (ref >200)

## 2019-01-04 NOTE — Telephone Encounter (Signed)
Patsy (spouse) called and L/M on V/M stating she needed to speak with Debbie. Patient has a CT Scan tomorrow.

## 2019-01-05 ENCOUNTER — Ambulatory Visit
Admission: RE | Admit: 2019-01-05 | Discharge: 2019-01-05 | Disposition: A | Discharge status: Home / Self Care | Payer: Medicare Other | Source: Ambulatory Visit | Attending: Gastroenterology | Admitting: Gastroenterology

## 2019-01-05 DIAGNOSIS — R109 Unspecified abdominal pain: Secondary | ICD-10-CM

## 2019-01-05 DIAGNOSIS — K449 Diaphragmatic hernia without obstruction or gangrene: Secondary | ICD-10-CM | POA: Diagnosis not present

## 2019-01-05 DIAGNOSIS — R197 Diarrhea, unspecified: Secondary | ICD-10-CM | POA: Diagnosis not present

## 2019-01-05 DIAGNOSIS — E340 Carcinoid syndrome: Secondary | ICD-10-CM | POA: Insufficient documentation

## 2019-01-05 NOTE — Telephone Encounter (Signed)
I contacted wife and apologized for not returning her call, that after lunch then clinic, I did miss this. She was very understanding. It was about the CT contrast of which was explained to pt when CT was done.

## 2019-01-06 LAB — 5 HIAA, QUANTITATIVE, URINE, 24 HOUR
5-HIAA, Ur: 1.8 mg/L
5-HIAA,QUANT.,24 HR URINE: 2 mg/(24.h) (ref 0.0–14.9)

## 2019-01-06 LAB — SPECIMEN STATUS REPORT

## 2019-01-12 ENCOUNTER — Telehealth: Payer: Self-pay | Admitting: Gastroenterology

## 2019-01-12 NOTE — Telephone Encounter (Signed)
Patient's wife called wanting to know about patient's ct done on 01-05-2019.

## 2019-01-16 ENCOUNTER — Other Ambulatory Visit: Payer: Self-pay | Admitting: Gastroenterology

## 2019-01-16 DIAGNOSIS — R232 Flushing: Secondary | ICD-10-CM

## 2019-01-16 NOTE — Progress Notes (Signed)
Debbie, please let the patient know the CT scan did not show any lesions or masses to explain his symptoms.  I discussed this with the radiologist, Dr. Clovis Riley, to discuss what other imaging can be done to evaluate if there are any lesions present which can be causing carcinoid syndrome, which are hormones released from lesions or masses that can cause diarrhea and flushing like he is having.  He recommended an Octreoscan, and I have ordered this.  Please schedule.  Also, please let him know that the CT scan showed a lung nodule that is being read as chronic scarring.  I am forwarding the results to Dr. Jeananne Rama as well as the radiologist has recommended a chest CT in 3 months to reevaluate his lung nodule.  This will need to be evaluated and ordered by Dr. Jeananne Rama for follow-up.

## 2019-01-18 NOTE — Telephone Encounter (Signed)
Patient's wife is calling back asking for CT results.  She is very anxious for a return call.

## 2019-01-19 NOTE — Telephone Encounter (Signed)
Pt left vm returning Debbies call

## 2019-01-19 NOTE — Telephone Encounter (Signed)
-----   Message from Virgel Manifold, MD sent at 01/16/2019 11:03 AM EST ----- Vincent Koch, please let the patient know the CT scan did not show any lesions or masses to explain his symptoms.  I discussed this with the radiologist, Dr. Clovis Riley, to discuss what other imaging can be done to evaluate if there are any lesions present which can be causing carcinoid syndrome, which are hormones released from lesions or masses that can cause diarrhea and flushing like he is having.  He recommended an Octreoscan, and I have ordered this.  Please schedule.  Also, please let him know that the CT scan showed a lung nodule that is being read as chronic scarring.  I am forwarding the results to Dr. Jeananne Rama as well as the radiologist has recommended a chest CT in 3 months to reevaluate his lung nodule.  This will need to be evaluated and ordered by Dr. Jeananne Rama for follow-up.

## 2019-01-19 NOTE — Telephone Encounter (Signed)
I spoke with pt and he will discuss this Octreoscan with his wife and will contact office as to if he would like this scheduled. Also informed of lung nodule that was read as chronic scarring and CT was recommended in 3 months to reevaluate this. This was sent to Dr. Jeananne Rama for follow up.

## 2019-01-19 NOTE — Telephone Encounter (Signed)
LMTCO.

## 2019-04-19 ENCOUNTER — Telehealth: Payer: Self-pay | Admitting: Family Medicine

## 2019-04-19 DIAGNOSIS — R911 Solitary pulmonary nodule: Secondary | ICD-10-CM

## 2019-04-19 NOTE — Telephone Encounter (Signed)
-----   Message from Vincent Koch, [place] sent at 01/16/2019 11:59 AM EST ----- Needs follow up CT of the chest for follow up nodule

## 2019-04-23 ENCOUNTER — Other Ambulatory Visit: Payer: Self-pay | Admitting: *Deleted

## 2019-04-23 DIAGNOSIS — R911 Solitary pulmonary nodule: Secondary | ICD-10-CM

## 2019-04-25 ENCOUNTER — Other Ambulatory Visit: Payer: Self-pay | Admitting: Oncology

## 2019-04-25 ENCOUNTER — Telehealth: Payer: Self-pay | Admitting: Family Medicine

## 2019-04-25 DIAGNOSIS — R911 Solitary pulmonary nodule: Secondary | ICD-10-CM

## 2019-04-25 NOTE — Progress Notes (Signed)
  Pulmonary Nodule Clinic Telephone Note  Received referral from PCP, Dr. Jeananne Rama.   Per most recent guidelines and recommendations from Price (2017), this patient requires a CT scan without contrast ASAP and a follow-up clinic visit for results.   Dr. Luvenia Heller (PCP) has been unable to get CT scan approved by insurance so have asked the pulmonary nodule clinic to take care of follow-ups.  I have personally reviewed all patient's previous imaging. Last CT scan completed on 01/05/19 revealed Lingula 12-14 mm lung nodule, but probably chronic scarring given a similar appearance on a 2009 chest radiograph. Initial follow-up by Chest CT without contrast is recommended in 3 months .  Previous imaging from 2009 revealed similar findings when compared. Per Fleischner guidelines (2017), CT scan without contrast is recommended in 3 from initial and if stable repeat CT scan at 18 to 24 months (from first scan) to ensure stability in high risk patients.  High risk factors include: History of heavy smoking, exposure to asbestos, radium or uranium, personal family history of lung cancer, older age, sex (females greater than males), race (black and native [place] greater than weight), marginal speculation, upper lobe location, multiplicity (less than 5 nodules increases risk for malignancy) and emphysema and/or pulmonary fibrosis.   This recommendation follows the consensus statement: Guidelines for Management of Incidental Pulmonary Nodules Detected on CT Images: From the Fleischner Society 2017; Radiology 2017; 284:228-243.    I have placed order for CT scan without contrast to be completed ASAP.  I would like im to see me in our Pulmonary Nodule Clinic after his CT scan on scheduled clinic day (Friday Morning).   Faythe Casa, NP

## 2019-04-25 NOTE — Telephone Encounter (Signed)
Copied from [place] [id number]. Topic: General - Other >> Apr 24, 2019  1:26 PM Carolyn Stare wrote:  Pt said they wanted to fup on a call they received about a CT scan pt had, wife said Dr Jeananne Rama had call them and they want to speak with him  Discussed with patient recommendations to have follow-up CT patient indicates he will comply.

## 2019-04-26 ENCOUNTER — Other Ambulatory Visit: Payer: Self-pay

## 2019-04-26 ENCOUNTER — Ambulatory Visit: Payer: Medicare Other | Admitting: Family Medicine

## 2019-04-26 ENCOUNTER — Encounter: Payer: Self-pay | Admitting: Family Medicine

## 2019-04-26 NOTE — Progress Notes (Signed)
No visit discussed yesterday

## 2019-05-07 ENCOUNTER — Encounter: Payer: Self-pay | Admitting: Family Medicine

## 2019-05-07 ENCOUNTER — Ambulatory Visit (INDEPENDENT_AMBULATORY_CARE_PROVIDER_SITE_OTHER): Payer: Medicare Other | Admitting: Family Medicine

## 2019-05-07 ENCOUNTER — Other Ambulatory Visit: Payer: Self-pay

## 2019-05-07 DIAGNOSIS — I10 Essential (primary) hypertension: Secondary | ICD-10-CM | POA: Diagnosis not present

## 2019-05-07 DIAGNOSIS — R918 Other nonspecific abnormal finding of lung field: Secondary | ICD-10-CM | POA: Insufficient documentation

## 2019-05-07 DIAGNOSIS — R197 Diarrhea, unspecified: Secondary | ICD-10-CM | POA: Diagnosis not present

## 2019-05-07 NOTE — Assessment & Plan Note (Signed)
The current medical regimen is effective;  continue present plan and medications.  

## 2019-05-07 NOTE — Assessment & Plan Note (Signed)
Reviewed CT scan and orders patient deferring currently but will have done in the near future.

## 2019-05-07 NOTE — Progress Notes (Signed)
There were no vitals taken for this visit.   Subjective:    Patient ID: Vincent Koch, male    DOB: 1934-04-01, 85 y.o.   MRN: [id number]  HPI: Vincent Koch is a 85 y.o. male  Med check  Discussed with Dr. ongoing concerns about intermittent diarrhea reviewed gastroenterology notes had had extensive work-up.  1 of the problems was pulmonary nodule seen on CT scan with follow-up this month.  Patient has this scheduled but is put on hold because of has cataract surgery coming up. Reviewed patient's lab work. Blood pressure doing well no complaints.   Relevant past medical, surgical, family and social history reviewed and updated as indicated. Interim medical history since our last visit reviewed. Allergies and medications reviewed and updated.  Review of Systems  Constitutional: Negative.   Respiratory: Negative.   Cardiovascular: Negative.     Per HPI unless specifically indicated above     Objective:    There were no vitals taken for this visit.  Wt Readings from Last 3 Encounters:  01/03/19 165 lb 12.8 oz (75.2 kg)  12/28/18 166 lb 6.4 oz (75.5 kg)  12/11/18 164 lb (74.4 kg)    Physical Exam  Results for orders placed or performed in visit on 12/28/18  5 HIAA, quantitative, Urine, 24 hour  Result Value Ref Range   5-HIAA, Ur 1.8 Undefined mg/L   5-HIAA,Quant.,24 Hr Urine 2.0 0.0 - 14.9 mg/24 hr  Chromogranin A  Result Value Ref Range   Chromogranin A (ng/mL) 404.8 (H) 0.0 - 101.8 ng/mL  BUN+Creat  Result Value Ref Range   BUN 15 8 - 27 mg/dL   Creatinine, Ser 1.36 (H) 0.76 - 1.27 mg/dL   GFR calc non Af Amer 48 (L) >59 mL/min/1.73   GFR calc Af Amer 56 (L) >59 mL/min/1.73   BUN/Creatinine Ratio 11 10 - 24  GI Profile, Stool, PCR  Result Value Ref Range   Campylobacter Not Detected Not Detected   C difficile toxin A/B Not Detected Not Detected   Plesiomonas shigelloides Not Detected Not Detected   Salmonella Not Detected Not Detected   Vibrio  Not Detected Not Detected   Vibrio cholerae Not Detected Not Detected   Yersinia enterocolitica Not Detected Not Detected   Enteroaggregative E coli Not Detected Not Detected   Enteropathogenic E coli Not Detected Not Detected   Enterotoxigenic E coli Not Detected Not Detected   Shiga-toxin-producing E coli Not Detected Not Detected   E coli X381 Not applicable Not Detected   Shigella/Enteroinvasive E coli Not Detected Not Detected   Cryptosporidium Not Detected Not Detected   Cyclospora cayetanensis Not Detected Not Detected   Entamoeba histolytica Not Detected Not Detected   Giardia lamblia Not Detected Not Detected   Adenovirus F 40/41 Not Detected Not Detected   Astrovirus Not Detected Not Detected   Norovirus GI/GII Not Detected Not Detected   Rotavirus A Not Detected Not Detected   Sapovirus Not Detected Not Detected  Pancreatic elastase, fecal  Result Value Ref Range   Pancreatic Elastase, Fecal 274 >200 ug Elast./g  Specimen status report  Result Value Ref Range   specimen status report Comment   Specimen status report  Result Value Ref Range   specimen status report Comment   Specimen status report  Result Value Ref Range   specimen status report Comment       Assessment & Plan:   Problem List Items Addressed This Visit      Cardiovascular  and Mediastinum   Hypertension    The current medical regimen is effective;  continue present plan and medications.         Other   Diarrhea    Reviewed intermittent diarrhea interfering with going out reviewed use of Imodium      Pulmonary nodules    Reviewed CT scan and orders patient deferring currently but will have done in the near future.        Telemedicine using audio/video telecommunications for a synchronous communication visit. Today's visit due to COVID-19 isolation precautions I connected with and verified that I am speaking with the correct person using two identifiers.   I discussed the limitations,  risks, security and privacy concerns of performing an evaluation and management service by telecommunication and the availability of in person appointments. I also discussed with the patient that there may be a patient responsible charge related to this service. The patient expressed understanding and agreed to proceed. The patient's location is home. I am at home.   I discussed the assessment and treatment plan with the patient. The patient was provided an opportunity to ask questions and all were answered. The patient agreed with the plan and demonstrated an understanding of the instructions.   The patient was advised to call back or seek an in-person evaluation if the symptoms worsen or if the condition fails to improve as anticipated.   I provided 21+ minutes of time during this encounter.  Follow up plan: Return if symptoms worsen or fail to improve, for As scheduled.

## 2019-05-07 NOTE — Assessment & Plan Note (Signed)
Reviewed intermittent diarrhea interfering with going out reviewed use of Imodium

## 2019-05-29 DIAGNOSIS — H02051 Trichiasis without entropian right upper eyelid: Secondary | ICD-10-CM | POA: Diagnosis not present

## 2019-05-29 DIAGNOSIS — H2589 Other age-related cataract: Secondary | ICD-10-CM | POA: Diagnosis not present

## 2019-06-13 ENCOUNTER — Ambulatory Visit (INDEPENDENT_AMBULATORY_CARE_PROVIDER_SITE_OTHER): Payer: Medicare Other

## 2019-06-13 ENCOUNTER — Telehealth: Payer: Self-pay | Admitting: Oncology

## 2019-06-13 VITALS — BP 105/60 | HR 53

## 2019-06-13 DIAGNOSIS — Z0000 Encounter for general adult medical examination without abnormal findings: Secondary | ICD-10-CM

## 2019-06-13 NOTE — Progress Notes (Signed)
Subjective:   Vincent Koch is a 85 y.o. male who presents for Medicare Annual/Subsequent preventive examination.  This visit is being conducted via phone call  - after an attmept to do on video chat - due to the COVID-19 pandemic. This patient has given me verbal consent via phone to conduct this visit, patient states they are participating from their home address. Some vital signs may be absent or patient reported.   Patient identification: identified by name, DOB, and current address.    Review of Systems:   Cardiac Risk Factors include: advanced age (>28men, >58 women);hypertension;male gender     Objective:    Vitals: BP 105/60 Comment: pt reported  Pulse (!) 53 Comment: pt reported  There is no height or weight on file to calculate BMI.  Advanced Directives 06/13/2019 12/11/2018 06/07/2018 06/08/2017 07/15/2016 06/14/2016  Does Patient Have a Medical Advance Directive? Yes Yes Yes No No Yes  Type of Advance Directive Living will;Healthcare Power of Attorney - Living will;Healthcare Power of Attorney - - Living will  Copy of [place] in Chart? No - copy requested - No - copy requested - - -  Would patient like information on creating a medical advance directive? - - - Yes (MAU/Ambulatory/Procedural Areas - Information given) Yes - Educational materials given -    Tobacco Social History   Tobacco Use  Smoking Status Never Smoker  Smokeless Tobacco Never Used     Counseling given: Not Answered   Clinical Intake:  Pre-visit preparation completed: Yes  Pain : No/denies pain     Nutritional Risks: None Diabetes: No  How often do you need to have someone help you when you read instructions, pamphlets, or other written materials from your doctor or pharmacy?: 1 - Never What is the last grade level you completed in school?: masters  Interpreter Needed?: No  Information entered by :: Kalani Sthilaire,LPN  Past Medical History:  Diagnosis Date  .  Bradycardia   . Chronic kidney disease   . GERD (gastroesophageal reflux disease)   . Hypertension   . Neuropathy    toes  . Osteoporosis   . Peripheral neuropathy   . Thyroid disease    Past Surgical History:  Procedure Laterality Date  . COLONOSCOPY WITH PROPOFOL N/A 12/11/2018   Procedure: COLONOSCOPY WITH PROPOFOL;  Surgeon: Virgel Manifold, MD;  Location: [facility]COPY;  Service: Endoscopy;  Laterality: N/A;  . ESOPHAGEAL DILATION N/A 06/14/2016   Procedure: ESOPHAGOGASTRODUODENOSCOPY (EGD) WITH PROPOFOL with dilation;  Surgeon: Lucilla Lame, MD;  Location: [facility];  Service: Endoscopy;  Laterality: N/A;  . ESOPHAGOGASTRODUODENOSCOPY (EGD) WITH PROPOFOL N/A 07/15/2016   Procedure: ESOPHAGOGASTRODUODENOSCOPY (EGD) WITH DILATION;  Surgeon: Lucilla Lame, MD;  Location: [place];  Service: Endoscopy;  Laterality: N/A;  . ESOPHAGOGASTRODUODENOSCOPY (EGD) WITH PROPOFOL N/A 12/11/2018   Procedure: ESOPHAGOGASTRODUODENOSCOPY (EGD) WITH PROPOFOL;  Surgeon: Virgel Manifold, MD;  Location: [facility];  Service: Endoscopy;  Laterality: N/A;  . HERNIA REPAIR    . PARATHYROIDECTOMY     Family History  Problem Relation Age of Onset  . Stroke Mother   . Throat cancer Father    Social History   Socioeconomic History  . Marital status: Married    Spouse name: Not on file  . Number of children: Not on file  . Years of education: Not on file  . Highest education level: Master's degree (e.g., MA, MS, MEng, MEd, MSW, MBA)  Occupational History  . Not on file  Social Needs  . Financial resource strain: Not hard at all  . Food insecurity    Worry: Never true    Inability: Never true  . Transportation needs    Medical: No    Non-medical: No  Tobacco Use  . Smoking status: Never Smoker  . Smokeless tobacco: Never Used  Substance and Sexual Activity  . Alcohol use: Never    Frequency: Never  . Drug use: Never  . Sexual activity: Not on file  Lifestyle   . Physical activity    Days per week: 0 days    Minutes per session: 0 min  . Stress: Not at all  Relationships  . Social connections    Talks on phone: More than three times a week    Gets together: More than three times a week    Attends religious service: More than 4 times per year    Active member of club or organization: No    Attends meetings of clubs or organizations: Never    Relationship status: Married  Other Topics Concern  . Not on file  Social History Narrative  . Not on file    Outpatient Encounter Medications as of 06/13/2019  Medication Sig  . acetaminophen (TYLENOL) 500 MG tablet Take 500 mg by mouth every 6 (six) hours as needed.  Marland Kitchen aspirin EC 81 MG tablet Take 81 mg by mouth once.   . calcium-vitamin D (CALCIUM 500/D) 500-200 MG-UNIT tablet Take 1 tablet by mouth.  . cyanocobalamin 500 MCG tablet Take 500 mcg by mouth daily.  . fexofenadine (ALLEGRA) 180 MG tablet Take 180 mg by mouth as needed for allergies.   Marland Kitchen lisinopril (PRINIVIL,ZESTRIL) 10 MG tablet Take 1 tablet (10 mg total) by mouth daily.  Marland Kitchen loperamide (IMODIUM A-D) 2 MG tablet Take 2 mg by mouth 4 (four) times daily as needed for diarrhea or loose stools.  . Multiple Vitamin (MULTI-VITAMINS) TABS Take by mouth daily.   . pantoprazole (PROTONIX) 40 MG tablet Take 1 tablet (40 mg total) by mouth daily.   No facility-administered encounter medications on file as of 06/13/2019.     Activities of Daily Living In your present state of health, do you have any difficulty performing the following activities: 06/13/2019  Hearing? N  Vision? Y  Comment going to have catract surgery  Difficulty concentrating or making decisions? N  Walking or climbing stairs? N  Dressing or bathing? N  Doing errands, shopping? N  Preparing Food and eating ? N  Using the Toilet? N  In the past six months, have you accidently leaked urine? N  Do you have problems with loss of bowel control? N  Managing your Medications? N   Managing your Finances? N  Housekeeping or managing your Housekeeping? N  Some recent data might be hidden    Patient Care Team: Guadalupe Maple, MD as PCP - General (Family Medicine) Lucilla Lame, MD as Consulting Physician (Gastroenterology)   Assessment:   This is a routine wellness examination for Jaleel.  Exercise Activities and Dietary recommendations Current Exercise Habits: Home exercise routine, Type of exercise: calisthenics, Time (Minutes): 15, Frequency (Times/Week): 5, Weekly Exercise (Minutes/Week): 75, Intensity: Mild, Exercise limited by: None identified  Goals    . DIET - INCREASE WATER INTAKE     Recommend drinking at least 6-8 glasses of water a day     . Increase water intake     Recommend to continue drinking at least 5-6 glasses of water a day  Fall Risk: Fall Risk  06/13/2019 06/29/2018 06/07/2018 12/28/2017 06/22/2017  Falls in the past year? 0 No No No No    FALL RISK PREVENTION PERTAINING TO THE HOME:  Any stairs in or around the home? Yes  If so, are there any without handrails? No   Home free of loose throw rugs in walkways, pet beds, electrical cords, etc? Yes  Adequate lighting in your home to reduce risk of falls? Yes   ASSISTIVE DEVICES UTILIZED TO PREVENT FALLS:  Life alert? No  Use of a cane, walker or w/c? No  Grab bars in the bathroom? No  Shower chair or bench in shower? No  Elevated toilet seat or a handicapped toilet? No   TIMED UP AND GO: Unable to perform   Depression Screen PHQ 2/9 Scores 06/13/2019 06/07/2018 12/28/2017 06/22/2017  PHQ - 2 Score 0 0 0 0    Cognitive Function     6CIT Screen 06/07/2018 06/08/2017  What Year? 0 points 0 points  What month? 0 points 0 points  What time? 0 points 0 points  Count back from 20 0 points 0 points  Months in reverse 0 points 0 points  Repeat phrase 2 points 2 points  Total Score 2 2    Immunization History  Administered Date(s) Administered  . Influenza, High Dose Seasonal PF  09/12/2017, 08/30/2018  . Influenza,inj,Quad PF,6+ Mos 09/22/2015  . Pneumococcal Conjugate-13 10/10/2014  . Pneumococcal-Unspecified 11/02/2004  . Tdap 06/29/2016  . Zoster 01/30/2007    Qualifies for Shingles Vaccine? Yes  Zostavax completed 2008. Due for Shingrix. Education has been provided regarding the importance of this vaccine. Pt has been advised to call insurance company to determine out of pocket expense. Advised may also receive vaccine at local pharmacy or Health Dept. Verbalized acceptance and understanding.  Tdap: up to date   Flu Vaccine: up to date   Pneumococcal Vaccine:up to date   Screening Tests Health Maintenance  Topic Date Due  . INFLUENZA VACCINE  06/23/2019  . COLONOSCOPY  12/12/2023  . TETANUS/TDAP  06/29/2026  . PNA vac Low Risk Adult  Completed   Cancer Screenings:  Colorectal Screening: Completed 12/11/2018. No longer required  Lung Cancer Screening: (Low Dose CT Chest recommended if Age 44-80 years, 30 pack-year currently smoking OR have quit w/in 15years.) does not qualify.     Additional Screening:  Hepatitis C Screening: does not qualify  Vision Screening: Recommended annual ophthalmology exams for early detection of glaucoma and other disorders of the eye. Is the patient up to date with their annual eye exam?  Yes  Who is the provider or what is the name of the office in which the pt attends annual eye exams? [facility]    Dental Screening: Recommended annual dental exams for proper oral hygiene  Community Resource Referral:  CRR required this visit?  No        Plan:  I have personally reviewed and addressed the Medicare Annual Wellness questionnaire and have noted the following in the patient's chart:  A. Medical and social history B. Use of alcohol, tobacco or illicit drugs  C. Current medications and supplements D. Functional ability and status E.  Nutritional status F.  Physical activity G. Advance directives H. List of  other physicians I.  Hospitalizations, surgeries, and ER visits in previous 12 months J.  [place] such as hearing and vision if needed, cognitive and depression L. Referrals and appointments   In addition, I have reviewed and discussed  with patient certain preventive protocols, quality metrics, and best practice recommendations. A written personalized care plan for preventive services as well as general preventive health recommendations were provided to patient.   Signed,   Bevelyn Ngo, LPN  07/16/538 Nurse Health Advisor   Nurse Notes: none

## 2019-06-13 NOTE — Telephone Encounter (Signed)
Left voicemail for return phone call to set up CT scan for follow-up of pulmonary lung nodule.  Patient initially asked to be pushed d/t COVID-19 restrictions.  Will wait to schedule appointment until I hear back from him.  Faythe Casa, NP 06/13/2019 2:27 PM

## 2019-06-13 NOTE — Patient Instructions (Addendum)
[name] , Thank you for taking time to come for your Medicare Wellness Visit. I appreciate your ongoing commitment to your health goals. Please review the following plan we discussed and let me know if I can assist you in the future.   Screening recommendations/referrals: Colonoscopy: no longer required Recommended yearly ophthalmology/optometry visit for glaucoma screening and checkup Recommended yearly dental visit for hygiene and checkup  Vaccinations: Influenza vaccine: up to date Pneumococcal vaccine: up to date Tdap vaccine: up to date Shingles vaccine: shingrix eligible, check with your insurance company for coverage    Advanced directives: Please bring a copy of your health care power of attorney and living will to the office at your convenience.   Conditions/risks identified: none  Next appointment: Follow up in one year for your annual wellness visit.   Preventive Care 83 Years and Older, Male Preventive care refers to lifestyle choices and visits with your health care provider that can promote health and wellness. What does preventive care include?  A yearly physical exam. This is also called an annual well check.  Dental exams once or twice a year.  Routine eye exams. Ask your health care provider how often you should have your eyes checked.  Personal lifestyle choices, including:  Daily care of your teeth and gums.  Regular physical activity.  Eating a healthy diet.  Avoiding tobacco and drug use.  Limiting alcohol use.  Practicing safe sex.  Taking low doses of aspirin every day.  Taking vitamin and mineral supplements as recommended by your health care provider. What happens during an annual well check? The services and screenings done by your health care provider during your annual well check will depend on your age, overall health, lifestyle risk factors, and family history of disease. Counseling  Your health care provider may ask you  questions about your:  Alcohol use.  Tobacco use.  Drug use.  Emotional well-being.  Home and relationship well-being.  Sexual activity.  Eating habits.  History of falls.  Memory and ability to understand (cognition).  Work and work Statistician. Screening  You may have the following tests or measurements:  Height, weight, and BMI.  Blood pressure.  Lipid and cholesterol levels. These may be checked every 5 years, or more frequently if you are over 4 years old.  Skin check.  Lung cancer screening. You may have this screening every year starting at age 49 if you have a 30-pack-year history of smoking and currently smoke or have quit within the past 15 years.  Fecal occult blood test (FOBT) of the stool. You may have this test every year starting at age 38.  Flexible sigmoidoscopy or colonoscopy. You may have a sigmoidoscopy every 5 years or a colonoscopy every 10 years starting at age 52.  Prostate cancer screening. Recommendations will vary depending on your family history and other risks.  Hepatitis C blood test.  Hepatitis B blood test.  Sexually transmitted disease (STD) testing.  Diabetes screening. This is done by checking your blood sugar (glucose) after you have not eaten for a while (fasting). You may have this done every 1-3 years.  Abdominal aortic aneurysm (AAA) screening. You may need this if you are a current or former smoker.  Osteoporosis. You may be screened starting at age 22 if you are at high risk. Talk with your health care provider about your test results, treatment options, and if necessary, the need for more tests. Vaccines  Your health care provider may recommend certain vaccines,  such as:  Influenza vaccine. This is recommended every year.  Tetanus, diphtheria, and acellular pertussis (Tdap, Td) vaccine. You may need a Td booster every 10 years.  Zoster vaccine. You may need this after age 33.  Pneumococcal 13-valent conjugate  (PCV13) vaccine. One dose is recommended after age 21.  Pneumococcal polysaccharide (PPSV23) vaccine. One dose is recommended after age 107. Talk to your health care provider about which screenings and vaccines you need and how often you need them. This information is not intended to replace advice given to you by your health care provider. Make sure you discuss any questions you have with your health care provider. Document Released: 12/05/2015 Document Revised: 07/28/2016 Document Reviewed: 09/09/2015 Elsevier Interactive Patient Education  2017 [place] Prevention in the Home Falls can cause injuries. They can happen to people of all ages. There are many things you can do to make your home safe and to help prevent falls. What can I do on the outside of my home?  Regularly fix the edges of walkways and driveways and fix any cracks.  Remove anything that might make you trip as you walk through a door, such as a raised step or threshold.  Trim any bushes or trees on the path to your home.  Use bright outdoor lighting.  Clear any walking paths of anything that might make someone trip, such as rocks or tools.  Regularly check to see if handrails are loose or broken. Make sure that both sides of any steps have handrails.  Any raised decks and porches should have guardrails on the edges.  Have any leaves, snow, or ice cleared regularly.  Use sand or salt on walking paths during winter.  Clean up any spills in your garage right away. This includes oil or grease spills. What can I do in the bathroom?  Use night lights.  Install grab bars by the toilet and in the tub and shower. Do not use towel bars as grab bars.  Use non-skid mats or decals in the tub or shower.  If you need to sit down in the shower, use a plastic, non-slip stool.  Keep the floor dry. Clean up any water that spills on the floor as soon as it happens.  Remove soap buildup in the tub or shower  regularly.  Attach bath mats securely with double-sided non-slip rug tape.  Do not have throw rugs and other things on the floor that can make you trip. What can I do in the bedroom?  Use night lights.  Make sure that you have a light by your bed that is easy to reach.  Do not use any sheets or blankets that are too big for your bed. They should not hang down onto the floor.  Have a firm chair that has side arms. You can use this for support while you get dressed.  Do not have throw rugs and other things on the floor that can make you trip. What can I do in the kitchen?  Clean up any spills right away.  Avoid walking on wet floors.  Keep items that you use a lot in easy-to-reach places.  If you need to reach something above you, use a strong step stool that has a grab bar.  Keep electrical cords out of the way.  Do not use floor polish or wax that makes floors slippery. If you must use wax, use non-skid floor wax.  Do not have throw rugs and other things on  the floor that can make you trip. What can I do with my stairs?  Do not leave any items on the stairs.  Make sure that there are handrails on both sides of the stairs and use them. Fix handrails that are broken or loose. Make sure that handrails are as long as the stairways.  Check any carpeting to make sure that it is firmly attached to the stairs. Fix any carpet that is loose or worn.  Avoid having throw rugs at the top or bottom of the stairs. If you do have throw rugs, attach them to the floor with carpet tape.  Make sure that you have a light switch at the top of the stairs and the bottom of the stairs. If you do not have them, ask someone to add them for you. What else can I do to help prevent falls?  Wear shoes that:  Do not have high heels.  Have rubber bottoms.  Are comfortable and fit you well.  Are closed at the toe. Do not wear sandals.  If you use a stepladder:  Make sure that it is fully  opened. Do not climb a closed stepladder.  Make sure that both sides of the stepladder are locked into place.  Ask someone to hold it for you, if possible.  Clearly mark and make sure that you can see:  Any grab bars or handrails.  First and last steps.  Where the edge of each step is.  Use tools that help you move around (mobility aids) if they are needed. These include:  Canes.  Walkers.  Scooters.  Crutches.  Turn on the lights when you go into a dark area. Replace any light bulbs as soon as they burn out.  Set up your furniture so you have a clear path. Avoid moving your furniture around.  If any of your floors are uneven, fix them.  If there are any pets around you, be aware of where they are.  Review your medicines with your doctor. Some medicines can make you feel dizzy. This can increase your chance of falling. Ask your doctor what other things that you can do to help prevent falls. This information is not intended to replace advice given to you by your health care provider. Make sure you discuss any questions you have with your health care provider. Document Released: 09/04/2009 Document Revised: 04/15/2016 Document Reviewed: 12/13/2014 Elsevier Interactive Patient Education  2017 [employer].

## 2019-06-14 DIAGNOSIS — H2589 Other age-related cataract: Secondary | ICD-10-CM | POA: Diagnosis not present

## 2019-06-14 DIAGNOSIS — I10 Essential (primary) hypertension: Secondary | ICD-10-CM | POA: Diagnosis not present

## 2019-06-21 ENCOUNTER — Encounter: Payer: Self-pay | Admitting: *Deleted

## 2019-06-21 NOTE — Discharge Instructions (Signed)

## 2019-06-22 ENCOUNTER — Other Ambulatory Visit: Admission: RE | Admit: 2019-06-22 | Payer: Medicare Other | Source: Ambulatory Visit

## 2019-06-22 ENCOUNTER — Encounter
Admission: RE | Admit: 2019-06-22 | Discharge: 2019-06-22 | Disposition: A | Discharge status: Home / Self Care | Payer: Medicare Other | Source: Ambulatory Visit | Attending: Ophthalmology | Admitting: Ophthalmology

## 2019-06-22 ENCOUNTER — Other Ambulatory Visit: Payer: Self-pay

## 2019-06-22 DIAGNOSIS — Z20828 Contact with and (suspected) exposure to other viral communicable diseases: Secondary | ICD-10-CM | POA: Diagnosis not present

## 2019-06-22 LAB — SARS CORONAVIRUS 2 (TAT 6-24 HRS): SARS Coronavirus 2: NEGATIVE

## 2019-06-27 ENCOUNTER — Encounter
Admission: RE | Disposition: A | Discharge status: Home / Self Care | Payer: Self-pay | Source: Home / Self Care | Attending: Ophthalmology

## 2019-06-27 ENCOUNTER — Other Ambulatory Visit: Payer: Self-pay

## 2019-06-27 ENCOUNTER — Ambulatory Visit: Payer: Medicare Other | Admitting: Anesthesiology

## 2019-06-27 ENCOUNTER — Ambulatory Visit
Admission: RE | Admit: 2019-06-27 | Discharge: 2019-06-27 | Disposition: A | Discharge status: Home / Self Care | Payer: Medicare Other | Attending: Ophthalmology | Admitting: Ophthalmology

## 2019-06-27 DIAGNOSIS — Z79899 Other long term (current) drug therapy: Secondary | ICD-10-CM | POA: Diagnosis not present

## 2019-06-27 DIAGNOSIS — I10 Essential (primary) hypertension: Secondary | ICD-10-CM | POA: Diagnosis not present

## 2019-06-27 DIAGNOSIS — H2589 Other age-related cataract: Secondary | ICD-10-CM | POA: Diagnosis not present

## 2019-06-27 DIAGNOSIS — H25811 Combined forms of age-related cataract, right eye: Secondary | ICD-10-CM | POA: Diagnosis not present

## 2019-06-27 DIAGNOSIS — H2512 Age-related nuclear cataract, left eye: Secondary | ICD-10-CM | POA: Insufficient documentation

## 2019-06-27 HISTORY — PX: CATARACT EXTRACTION W/PHACO: SHX586

## 2019-06-27 SURGERY — PHACOEMULSIFICATION, CATARACT, WITH IOL INSERTION
Anesthesia: Monitor Anesthesia Care | Site: Eye | Laterality: Right

## 2019-06-27 MED ORDER — EPINEPHRINE PF 1 MG/ML IJ SOLN
INTRAOCULAR | Status: DC | PRN
  Administered 2019-06-27: 70 mL via OPHTHALMIC

## 2019-06-27 MED ORDER — ARMC OPHTHALMIC DILATING DROPS
1.0000 "application " | OPHTHALMIC | Status: DC | PRN
  Administered 2019-06-27 (×3): 1 via OPHTHALMIC

## 2019-06-27 MED ORDER — FENTANYL CITRATE (PF) 100 MCG/2ML IJ SOLN
INTRAMUSCULAR | Status: DC | PRN
  Administered 2019-06-27: 50 ug via INTRAVENOUS

## 2019-06-27 MED ORDER — MOXIFLOXACIN HCL 0.5 % OP SOLN
1.0000 [drp] | OPHTHALMIC | Status: DC | PRN
  Administered 2019-06-27 (×3): 1 [drp] via OPHTHALMIC

## 2019-06-27 MED ORDER — BRIMONIDINE TARTRATE-TIMOLOL 0.2-0.5 % OP SOLN
OPHTHALMIC | Status: DC | PRN
  Administered 2019-06-27: 1 [drp] via OPHTHALMIC

## 2019-06-27 MED ORDER — CEFUROXIME OPHTHALMIC INJECTION 1 MG/0.1 ML
INJECTION | OPHTHALMIC | Status: DC | PRN
  Administered 2019-06-27: 0.1 mL via INTRACAMERAL

## 2019-06-27 MED ORDER — TRYPAN BLUE 0.06 % OP SOLN
OPHTHALMIC | Status: DC | PRN
  Administered 2019-06-27: 0.5 mL via INTRAOCULAR

## 2019-06-27 MED ORDER — MIDAZOLAM HCL 2 MG/2ML IJ SOLN
INTRAMUSCULAR | Status: DC | PRN
  Administered 2019-06-27: 1.5 mg via INTRAVENOUS

## 2019-06-27 MED ORDER — LIDOCAINE HCL (PF) 2 % IJ SOLN
INTRAOCULAR | Status: DC | PRN
  Administered 2019-06-27: 1 mL

## 2019-06-27 MED ORDER — NA HYALUR & NA CHOND-NA HYALUR 0.4-0.35 ML IO KIT
PACK | INTRAOCULAR | Status: DC | PRN
  Administered 2019-06-27: 1 mL via INTRAOCULAR

## 2019-06-27 MED ORDER — TETRACAINE HCL 0.5 % OP SOLN
1.0000 [drp] | OPHTHALMIC | Status: DC | PRN
  Administered 2019-06-27 (×3): 1 [drp] via OPHTHALMIC

## 2019-06-27 SURGICAL SUPPLY — 26 items
CANNULA ANT/CHMB 27G (MISCELLANEOUS) ×1 IMPLANT
CANNULA ANT/CHMB 27GA (MISCELLANEOUS) ×2 IMPLANT
GLOVE SURG LX 7.5 STRW (GLOVE) ×2
GLOVE SURG LX STRL 7.5 STRW (GLOVE) ×1 IMPLANT
GLOVE SURG TRIUMPH 8.0 PF LTX (GLOVE) ×2 IMPLANT
GOWN STRL REUS W/ TWL LRG LVL3 (GOWN DISPOSABLE) ×2 IMPLANT
GOWN STRL REUS W/TWL LRG LVL3 (GOWN DISPOSABLE) ×2
LENS IOL TECNIS ITEC 20.5 (Intraocular Lens) ×1 IMPLANT
MARKER SKIN DUAL TIP RULER LAB (MISCELLANEOUS) ×2 IMPLANT
NDL FILTER BLUNT 18X1 1/2 (NEEDLE) ×1 IMPLANT
NDL RETROBULBAR .5 NSTRL (NEEDLE) IMPLANT
NEEDLE FILTER BLUNT 18X 1/2SAF (NEEDLE) ×1
NEEDLE FILTER BLUNT 18X1 1/2 (NEEDLE) ×1 IMPLANT
PACK CATARACT BRASINGTON (MISCELLANEOUS) ×2 IMPLANT
PACK EYE AFTER SURG (MISCELLANEOUS) ×2 IMPLANT
PACK OPTHALMIC (MISCELLANEOUS) ×2 IMPLANT
RING MALYGIN 7.0 (MISCELLANEOUS) IMPLANT
SUT ETHILON 10-0 CS-B-6CS-B-6 (SUTURE)
SUT VICRYL  9 0 (SUTURE)
SUT VICRYL 9 0 (SUTURE) IMPLANT
SUTURE EHLN 10-0 CS-B-6CS-B-6 (SUTURE) IMPLANT
SYR 3ML LL SCALE MARK (SYRINGE) ×2 IMPLANT
SYR 5ML LL (SYRINGE) ×2 IMPLANT
SYR TB 1ML LUER SLIP (SYRINGE) ×2 IMPLANT
WATER STERILE IRR 500ML POUR (IV SOLUTION) ×2 IMPLANT
WIPE NON LINTING 3.25X3.25 (MISCELLANEOUS) ×2 IMPLANT

## 2019-06-27 NOTE — Anesthesia Preprocedure Evaluation (Addendum)
Anesthesia Evaluation  Patient identified by MRN, date of birth, ID band Patient awake    Reviewed: Allergy & Precautions, H&P , NPO status , Patient's Chart, lab work & pertinent test results  Airway Mallampati: II  TM Distance: >3 FB Neck ROM: full    Dental no notable dental hx.    Pulmonary neg pulmonary ROS,    Pulmonary exam normal        Cardiovascular hypertension, On Medications Normal cardiovascular exam     Neuro/Psych negative psych ROS   GI/Hepatic Neg liver ROS, Medicated,  Endo/Other  negative endocrine ROS  Renal/GU   negative genitourinary   Musculoskeletal   Abdominal   Peds  Hematology negative hematology ROS (+)   Anesthesia Other Findings   Reproductive/Obstetrics negative OB ROS                            Anesthesia Physical Anesthesia Plan  ASA: II  Anesthesia Plan: MAC   Post-op Pain Management:    Induction:   PONV Risk Score and Plan:   Airway Management Planned:   Additional Equipment:   Intra-op Plan:   Post-operative Plan:   Informed Consent: I have reviewed the patients History and Physical, chart, labs and discussed the procedure including the risks, benefits and alternatives for the proposed anesthesia with the patient or authorized representative who has indicated his/her understanding and acceptance.       Plan Discussed with:   Anesthesia Plan Comments:         Anesthesia Quick Evaluation

## 2019-06-27 NOTE — Transfer of Care (Signed)
Immediate Anesthesia Transfer of Care Note  Patient: Vincent Koch  Procedure(s) Performed: CATARACT EXTRACTION PHACO AND INTRAOCULAR LENS PLACEMENT (IOC)  RIGHT (Right Eye)  Patient Location: PACU  Anesthesia Type: MAC  Level of Consciousness: awake, alert  and patient cooperative  Airway and Oxygen Therapy: Patient Spontanous Breathing and Patient connected to supplemental oxygen  Post-op Assessment: Post-op Vital signs reviewed, Patient's Cardiovascular Status Stable, Respiratory Function Stable, Patent Airway and No signs of Nausea or vomiting  Post-op Vital Signs: Reviewed and stable  Complications: No apparent anesthesia complications

## 2019-06-27 NOTE — Anesthesia Postprocedure Evaluation (Signed)
Anesthesia Post Note  Patient: Vincent Koch  Procedure(s) Performed: CATARACT EXTRACTION PHACO AND INTRAOCULAR LENS PLACEMENT (IOC)  RIGHT (Right Eye)  Patient location during evaluation: PACU Anesthesia Type: MAC Level of consciousness: awake and alert Pain management: pain level controlled Vital Signs Assessment: post-procedure vital signs reviewed and stable Respiratory status: spontaneous breathing Cardiovascular status: stable Anesthetic complications: no    Huldah Marin, III,  Prabhav Faulkenberry D

## 2019-06-27 NOTE — Anesthesia Procedure Notes (Signed)
Procedure Name: MAC Performed by: Vadie Principato, CRNA Pre-anesthesia Checklist: Patient identified, Emergency Drugs available, Suction available, Timeout performed and Patient being monitored Patient Re-evaluated:Patient Re-evaluated prior to induction Oxygen Delivery Method: Nasal cannula Placement Confirmation: positive ETCO2       

## 2019-06-27 NOTE — H&P (Signed)

## 2019-06-27 NOTE — Op Note (Signed)
OPERATIVE NOTE  Vincent Koch [id number] 06/27/2019   PREOPERATIVE DIAGNOSIS:  Nuclear sclerotic cataract left eye. H25.12   POSTOPERATIVE DIAGNOSIS:    Nuclear sclerotic cataract left eye.     PROCEDURE:  Phacoemusification with posterior chamber intraocular lens placement of the left eye  Vision blue was used to stain the lens capsule.  LENS:   Implant Name Type Inv. Item Serial No. Manufacturer Lot No. LRB No. Used Action  LENS IOL DIOP 20.5 - [id number] Intraocular Lens LENS IOL DIOP 20.5 [id number]  Right 1 Implanted        ULTRASOUND TIME: 25  % of 1 minutes 48 seconds, CDE 27.8  SURGEON:  Wyonia Hough, MD   ANESTHESIA:  Topical with tetracaine drops and 2% Xylocaine jelly, augmented with 1% preservative-free intracameral lidocaine.    COMPLICATIONS:  None.   DESCRIPTION OF PROCEDURE:  The patient was identified in the holding room and transported to the operating room and placed in the supine position under the operating microscope.  The left eye was identified as the operative eye and it was prepped and draped in the usual sterile ophthalmic fashion.   A 1 millimeter clear-corneal paracentesis was made at the 1:30 position.  0.5 ml of preservative-free 1% lidocaine was injected into the anterior chamber. Vision Blue dye was placed into the anterior chamber to strain the lens capsule.  It was rinsed out using balanced salt solution.  The anterior chamber was filled with Viscoat viscoelastic.  A 2.4 millimeter keratome was used to make a near-clear corneal incision at the 10:30 position.  .  A curvilinear capsulorrhexis was made with a cystotome and capsulorrhexis forceps.  Balanced salt solution was used to hydrodissect and hydrodelineate the nucleus.   Phacoemulsification was then used in stop and chop fashion to remove the lens nucleus and epinucleus.  The remaining cortex was then removed using the irrigation and aspiration handpiece. Provisc was then placed  into the capsular bag to distend it for lens placement.  A lens was then injected into the capsular bag.  The remaining viscoelastic was aspirated.   Wounds were hydrated with balanced salt solution.  The anterior chamber was inflated to a physiologic pressure with balanced salt solution.  No wound leaks were noted. Cefuroxime 0.1 ml of a 10mg /ml solution was injected into the anterior chamber for a dose of 1 mg of intracameral antibiotic at the completion of the case.   Timolol and Brimonidine drops were applied to the eye.  The patient was taken to the recovery room in stable condition without complications of anesthesia or surgery.  [name] 06/27/2019, 12:23 PM

## 2019-06-28 ENCOUNTER — Encounter: Payer: Self-pay | Admitting: Ophthalmology

## 2019-07-04 ENCOUNTER — Encounter: Payer: Self-pay | Admitting: Family Medicine

## 2019-07-04 ENCOUNTER — Ambulatory Visit (INDEPENDENT_AMBULATORY_CARE_PROVIDER_SITE_OTHER): Payer: Medicare Other | Admitting: Family Medicine

## 2019-07-04 ENCOUNTER — Other Ambulatory Visit: Payer: Self-pay

## 2019-07-04 DIAGNOSIS — R918 Other nonspecific abnormal finding of lung field: Secondary | ICD-10-CM | POA: Diagnosis not present

## 2019-07-04 DIAGNOSIS — Z7189 Other specified counseling: Secondary | ICD-10-CM | POA: Diagnosis not present

## 2019-07-04 DIAGNOSIS — N183 Chronic kidney disease, stage 3 unspecified: Secondary | ICD-10-CM

## 2019-07-04 DIAGNOSIS — E210 Primary hyperparathyroidism: Secondary | ICD-10-CM | POA: Diagnosis not present

## 2019-07-04 DIAGNOSIS — D692 Other nonthrombocytopenic purpura: Secondary | ICD-10-CM

## 2019-07-04 DIAGNOSIS — I10 Essential (primary) hypertension: Secondary | ICD-10-CM | POA: Diagnosis not present

## 2019-07-04 MED ORDER — LISINOPRIL 10 MG PO TABS: 10.0000 mg | ORAL_TABLET | Freq: Every day | ORAL | 4 refills | Status: DC

## 2019-07-04 MED ORDER — PANTOPRAZOLE SODIUM 40 MG PO TBEC: 40.0000 mg | DELAYED_RELEASE_TABLET | Freq: Every day | ORAL | 4 refills | Status: DC

## 2019-07-04 NOTE — Assessment & Plan Note (Signed)
A voluntary discussion about advanced care planning including explanation and discussion of advanced directives was extentively discussed with the patient.  Explained about the healthcare proxy and living will was reviewed and packet with forms with expiration of how to fill them out was given.  Time spent: Encounter 16+ min individuals present: Patient 

## 2019-07-04 NOTE — Assessment & Plan Note (Signed)
The current medical regimen is effective;  continue present plan and medications.  

## 2019-07-04 NOTE — Assessment & Plan Note (Signed)
Discussed has not had CT yet but was waiting on eye surgery now that that is done we will go ahead and schedule CT scan.

## 2019-07-04 NOTE — Progress Notes (Addendum)
BP 130/70    Subjective:    Patient ID: Vincent Koch, male    DOB: 1934/05/17, 85 y.o.   MRN: [id number]  HPI: Vincent Koch is a 85 y.o. male  Med check Patient doing well just had eye surgery last week and is recovering without problems. Takes blood pressure and reflux medication without problems.  Relevant past medical, surgical, family and social history reviewed and updated as indicated. Interim medical history since our last visit reviewed. Allergies and medications reviewed and updated.  Review of Systems  Constitutional: Negative.   HENT: Negative.   Eyes: Negative.   Respiratory: Negative.   Cardiovascular: Negative.   Gastrointestinal: Negative.   Endocrine: Negative.   Genitourinary: Negative.   Musculoskeletal: Negative.   Skin: Negative.   Allergic/Immunologic: Negative.   Neurological: Negative.   Hematological: Negative.   Psychiatric/Behavioral: Negative.     Per HPI unless specifically indicated above     Objective:    BP 130/70   Wt Readings from Last 3 Encounters:  06/27/19 160 lb (72.6 kg)  01/03/19 165 lb 12.8 oz (75.2 kg)  12/28/18 166 lb 6.4 oz (75.5 kg)    Physical Exam  Results for orders placed or performed during the hospital encounter of 06/22/19  SARS CORONAVIRUS 2 Nasal Swab   Specimen: Nasal Swab  Result Value Ref Range   SARS Coronavirus 2 NEGATIVE NEGATIVE      Assessment & Plan:   Problem List Items Addressed This Visit      Cardiovascular and Mediastinum   Hypertension    The current medical regimen is effective;  continue present plan and medications.       Relevant Medications   lisinopril (ZESTRIL) 10 MG tablet   Other Relevant Orders   Comprehensive metabolic panel   Lipid panel   CBC with Differential/Platelet   TSH   Urinalysis, Routine w reflex microscopic   Purpura senilis (HCC)    No complaints      Relevant Medications   lisinopril (ZESTRIL) 10 MG tablet     Endocrine   Primary  hyperparathyroidism ([place])    The current medical regimen is effective;  continue present plan and medications. a      Relevant Orders   TSH   Urinalysis, Routine w reflex microscopic     Genitourinary   CKD (chronic kidney disease) stage 3, GFR 30-59 ml/min (HCC)    The current medical regimen is effective;  continue present plan and medications.       Relevant Orders   Lipid panel   CBC with Differential/Platelet     Other   Advanced care planning/counseling discussion    A voluntary discussion about advanced care planning including explanation and discussion of advanced directives was extentively discussed with the patient.  Explained about the healthcare proxy and living will was reviewed and packet with forms with expiration of how to fill them out was given.  Time spent: Encounter 16+ min individuals present: Patient      Pulmonary nodules    Discussed has not had CT yet but was waiting on eye surgery now that that is done we will go ahead and schedule CT scan.        Telemedicine using audio/video telecommunications for a synchronous communication visit. Today's visit due to COVID-19 isolation precautions I connected with and verified that I am speaking with the correct person using two identifiers.   I discussed the limitations, risks, security and privacy concerns of performing an evaluation  and management service by telecommunication and the availability of in person appointments. I also discussed with the patient that there may be a patient responsible charge related to this service. The patient expressed understanding and agreed to proceed. The patient's location is home. I am at home.   I discussed the assessment and treatment plan with the patient. The patient was provided an opportunity to ask questions and all were answered. The patient agreed with the plan and demonstrated an understanding of the instructions.   The patient was advised to call back or seek an  in-person evaluation if the symptoms worsen or if the condition fails to improve as anticipated.   I provided 21+ minutes of time during this encounter.  Follow up plan: Return in about 6 months (around 01/04/2020) for BMP.

## 2019-07-04 NOTE — Assessment & Plan Note (Signed)
No complaints

## 2019-07-04 NOTE — Assessment & Plan Note (Signed)
The current medical regimen is effective;  continue present plan and medications. a 

## 2019-07-20 ENCOUNTER — Telehealth: Payer: Self-pay | Admitting: *Deleted

## 2019-07-20 NOTE — Telephone Encounter (Signed)
Left message with patient to see if he was ready to reschedule his CT scan to follow up on lung nodule. Instructed pt to call back so he can be rescheduled for follow up CT scan and scheduled for visit in the Lung Nodule Clinic. Awaiting call back.

## 2019-08-01 ENCOUNTER — Telehealth: Payer: Self-pay | Admitting: *Deleted

## 2019-08-01 NOTE — Telephone Encounter (Signed)
Spoke with pt to ask if he is ready to reschedule his CT scan to follow up lung nodules in the lung nodule clinic. Per pt, he had eye surgery and is not ready to reschedule his scan due to [place]. Requested a call back in October to see if he is ready to schedule then. Will call back in October. Nothing further needed at this time.

## 2019-08-19 ENCOUNTER — Encounter: Payer: Self-pay | Admitting: Nurse Practitioner

## 2019-08-20 ENCOUNTER — Other Ambulatory Visit: Payer: Self-pay

## 2019-08-20 ENCOUNTER — Ambulatory Visit (INDEPENDENT_AMBULATORY_CARE_PROVIDER_SITE_OTHER): Payer: Medicare Other | Admitting: Nurse Practitioner

## 2019-08-20 ENCOUNTER — Encounter: Payer: Self-pay | Admitting: Nurse Practitioner

## 2019-08-20 VITALS — BP 115/71 | HR 78 | Temp 98.5°F | Ht 68.1 in | Wt 161.2 lb

## 2019-08-20 DIAGNOSIS — D692 Other nonthrombocytopenic purpura: Secondary | ICD-10-CM

## 2019-08-20 DIAGNOSIS — Z23 Encounter for immunization: Secondary | ICD-10-CM | POA: Diagnosis not present

## 2019-08-20 DIAGNOSIS — E785 Hyperlipidemia, unspecified: Secondary | ICD-10-CM | POA: Diagnosis not present

## 2019-08-20 DIAGNOSIS — E210 Primary hyperparathyroidism: Secondary | ICD-10-CM | POA: Diagnosis not present

## 2019-08-20 DIAGNOSIS — N183 Chronic kidney disease, stage 3 unspecified: Secondary | ICD-10-CM

## 2019-08-20 DIAGNOSIS — M810 Age-related osteoporosis without current pathological fracture: Secondary | ICD-10-CM | POA: Diagnosis not present

## 2019-08-20 DIAGNOSIS — Z0000 Encounter for general adult medical examination without abnormal findings: Secondary | ICD-10-CM

## 2019-08-20 DIAGNOSIS — I10 Essential (primary) hypertension: Secondary | ICD-10-CM | POA: Diagnosis not present

## 2019-08-20 LAB — MICROSCOPIC EXAMINATION
Bacteria, UA: NONE SEEN
RBC, Urine: NONE SEEN /hpf (ref 0–2)

## 2019-08-20 LAB — URINALYSIS, ROUTINE W REFLEX MICROSCOPIC
Bilirubin, UA: NEGATIVE
Glucose, UA: NEGATIVE
Ketones, UA: NEGATIVE
Nitrite, UA: NEGATIVE
Protein,UA: NEGATIVE
RBC, UA: NEGATIVE
Specific Gravity, UA: 1.02 (ref 1.005–1.030)
Urobilinogen, Ur: 2 mg/dL — ABNORMAL HIGH (ref 0.2–1.0)
pH, UA: 6 (ref 5.0–7.5)

## 2019-08-20 NOTE — Assessment & Plan Note (Signed)
Continue collaboration with endocrinology.  

## 2019-08-20 NOTE — Assessment & Plan Note (Signed)
Chronic, stable with diet control.  Continue to monitor and diet focus.

## 2019-08-20 NOTE — Assessment & Plan Note (Signed)
Chronic, stable.  No recent decline.  Continue to monitor and continue Lisinopril for kidney protection.  Labs today. °

## 2019-08-20 NOTE — Assessment & Plan Note (Signed)
Labs today . Continue current medication regimen.

## 2019-08-20 NOTE — Progress Notes (Signed)
BP 115/71    Pulse 78    Temp 98.5 F (36.9 C) (Oral)    Ht 5' 8.1" (1.73 m)    Wt 161 lb 3.2 oz (73.1 kg)    SpO2 98%    BMI 24.44 kg/m    Subjective:    Patient ID: Vincent Koch, male    DOB: 01/31/1934, 85 y.o.   MRN: [id number]  HPI: Vincent Koch is a 85 y.o. male presenting on 08/20/2019 for comprehensive medical examination. Current medical complaints include:none  He currently lives with: wife Interim Problems from his last visit: no   HYPERTENSION / HYPERLIPIDEMIA Continues on ASA, Lisinopril, no current statin.  Diet controlled HLD. Satisfied with current treatment? yes Duration of hypertension: chronic BP monitoring frequency: rarely BP range:  BP medication side effects: no Duration of hyperlipidemia: chronic Aspirin: yes Recent stressors: no Recurrent headaches: no Visual changes: no Palpitations: no Dyspnea: no Chest pain: no Lower extremity edema: no Dizzy/lightheaded: no   CHRONIC KIDNEY DISEASE Continues on Lisinopril for kidney protection. CKD status: stable Medications renally dose: yes Previous renal evaluation: no Pneumovax:  Up to Date Influenza Vaccine:  Up to Date   SENILE OSTEOPOROSIS:  Followed by endocrinology with last visit 12/27/2018, his DEXA was at this time too.  Continues on calcium and Vit D.  Did not tolerate bisphosphonate medications.  No recent or fractures.    Functional Status Survey: Is the patient deaf or have difficulty hearing?: No Does the patient have difficulty seeing, even when wearing glasses/contacts?: No Does the patient have difficulty concentrating, remembering, or making decisions?: No Does the patient have difficulty walking or climbing stairs?: No Does the patient have difficulty dressing or bathing?: No Does the patient have difficulty doing errands alone such as visiting a doctor's office or shopping?: No  FALL RISK: Fall Risk  08/20/2019 06/13/2019 06/29/2018 06/07/2018 12/28/2017  Falls in the past  year? 0 0 No No No  Number falls in past yr: 0 - - - -  Injury with Fall? 0 - - - -  Follow up Falls evaluation completed - - - -    Depression Screen Depression screen [facility] 2/9 08/20/2019 06/13/2019 06/07/2018 12/28/2017 06/22/2017  Decreased Interest 0 0 0 0 0  Down, Depressed, Hopeless 0 0 0 0 0  PHQ - 2 Score 0 0 0 0 0  Altered sleeping 0 - - - -  Tired, decreased energy 0 - - - -  Change in appetite 0 - - - -  Feeling bad or failure about yourself  0 - - - -  Trouble concentrating 0 - - - -  Moving slowly or fidgety/restless 0 - - - -  Suicidal thoughts 0 - - - -  PHQ-9 Score 0 - - - -  Difficult doing work/chores Not difficult at all - - - -    Advanced Directives <no information>  Past Medical History:  Past Medical History:  Diagnosis Date   Bradycardia    Chronic kidney disease    GERD (gastroesophageal reflux disease)    Hypertension    Neuropathy    toes   Osteoporosis    Peripheral neuropathy    Thyroid disease     Surgical History:  Past Surgical History:  Procedure Laterality Date   CATARACT EXTRACTION W/PHACO Right 06/27/2019   Procedure: CATARACT EXTRACTION PHACO AND INTRAOCULAR LENS PLACEMENT ([place])  RIGHT;  Surgeon: Leandrew Koyanagi, MD;  Location: [place];  Service: Ophthalmology;  Laterality: Right;  VISION BLUE   COLONOSCOPY WITH PROPOFOL N/A 12/11/2018   Procedure: COLONOSCOPY WITH PROPOFOL;  Surgeon: Virgel Manifold, MD;  Location: [facility];  Service: Endoscopy;  Laterality: N/A;   ESOPHAGEAL DILATION N/A 06/14/2016   Procedure: ESOPHAGOGASTRODUODENOSCOPY (EGD) WITH PROPOFOL with dilation;  Surgeon: Lucilla Lame, MD;  Location: [place];  Service: Endoscopy;  Laterality: N/A;   ESOPHAGOGASTRODUODENOSCOPY (EGD) WITH PROPOFOL N/A 07/15/2016   Procedure: ESOPHAGOGASTRODUODENOSCOPY (EGD) WITH DILATION;  Surgeon: Lucilla Lame, MD;  Location: [place];  Service: Endoscopy;  Laterality: N/A;    ESOPHAGOGASTRODUODENOSCOPY (EGD) WITH PROPOFOL N/A 12/11/2018   Procedure: ESOPHAGOGASTRODUODENOSCOPY (EGD) WITH PROPOFOL;  Surgeon: Virgel Manifold, MD;  Location: [facility];  Service: Endoscopy;  Laterality: N/A;   HERNIA REPAIR     PARATHYROIDECTOMY      Medications:  Current Outpatient Medications on File Prior to Visit  Medication Sig   acetaminophen (TYLENOL) 500 MG tablet Take 500 mg by mouth every 6 (six) hours as needed.   aspirin EC 81 MG tablet Take 81 mg by mouth once.    calcium-vitamin D (CALCIUM 500/D) 500-200 MG-UNIT tablet Take 1 tablet by mouth.   cyanocobalamin 500 MCG tablet Take 500 mcg by mouth daily.   fexofenadine (ALLEGRA) 180 MG tablet Take 180 mg by mouth as needed for allergies.    lisinopril (ZESTRIL) 10 MG tablet Take 1 tablet (10 mg total) by mouth daily.   loperamide (IMODIUM A-D) 2 MG tablet Take 2 mg by mouth 4 (four) times daily as needed for diarrhea or loose stools.   Multiple Vitamin (MULTI-VITAMINS) TABS Take by mouth daily.    pantoprazole (PROTONIX) 40 MG tablet Take 1 tablet (40 mg total) by mouth daily.   No current facility-administered medications on file prior to visit.     Allergies:  No Known Allergies  Social History:  Social History   Socioeconomic History   Marital status: Married    Spouse name: Not on file   Number of children: Not on file   Years of education: Not on file   Highest education level: Master's degree (e.g., MA, MS, MEng, MEd, MSW, MBA)  Occupational History   Not on file  Social Needs   Financial resource strain: Not hard at all   Food insecurity    Worry: Never true    Inability: Never true   Transportation needs    Medical: No    Non-medical: No  Tobacco Use   Smoking status: Never Smoker   Smokeless tobacco: Never Used  Substance and Sexual Activity   Alcohol use: Never    Frequency: Never   Drug use: Never   Sexual activity: Not on file  Lifestyle   Physical  activity    Days per week: 0 days    Minutes per session: 0 min   Stress: Not at all  Relationships   Social connections    Talks on phone: More than three times a week    Gets together: More than three times a week    Attends religious service: More than 4 times per year    Active member of club or organization: No    Attends meetings of clubs or organizations: Never    Relationship status: Married   Intimate partner violence    Fear of current or ex partner: No    Emotionally abused: No    Physically abused: No    Forced sexual activity: No  Other Topics Concern   Not on file  Social  History Narrative   Not on file   Social History   Tobacco Use  Smoking Status Never Smoker  Smokeless Tobacco Never Used   Social History   Substance and Sexual Activity  Alcohol Use Never   Frequency: Never    Family History:  Family History  Problem Relation Age of Onset   Stroke Mother    Throat cancer Father     Past medical history, surgical history, medications, allergies, family history and social history reviewed with patient today and changes made to appropriate areas of the chart.   Review of Systems - negative All other ROS negative except what is listed above and in the HPI.      Objective:    BP 115/71    Pulse 78    Temp 98.5 F (36.9 C) (Oral)    Ht 5' 8.1" (1.73 m)    Wt 161 lb 3.2 oz (73.1 kg)    SpO2 98%    BMI 24.44 kg/m   Wt Readings from Last 3 Encounters:  08/20/19 161 lb 3.2 oz (73.1 kg)  06/27/19 160 lb (72.6 kg)  01/03/19 165 lb 12.8 oz (75.2 kg)    Physical Exam Vitals signs and nursing note reviewed.  Constitutional:      General: He is awake. He is not in acute distress.    Appearance: He is well-developed. He is not ill-appearing.  HENT:     Head: Normocephalic and atraumatic.     Right Ear: Hearing, tympanic membrane, ear canal and external ear normal. No drainage.     Left Ear: Hearing, tympanic membrane, ear canal and external  ear normal. No drainage.     Nose: Nose normal.     Mouth/Throat:     Mouth: Mucous membranes are moist.     Pharynx: Uvula midline.  Eyes:     General: Lids are normal.        Right eye: No discharge.        Left eye: No discharge.     Extraocular Movements: Extraocular movements intact.     Conjunctiva/sclera: Conjunctivae normal.     Pupils: Pupils are equal, round, and reactive to light.     Visual Fields: Right eye visual fields normal and left eye visual fields normal.  Neck:     Musculoskeletal: Normal range of motion and neck supple.     Thyroid: No thyromegaly.     Vascular: No carotid bruit.  Cardiovascular:     Rate and Rhythm: Normal rate and regular rhythm.     Heart sounds: Normal heart sounds, S1 normal and S2 normal. No murmur. No gallop.   Pulmonary:     Effort: Pulmonary effort is normal. No accessory muscle usage or respiratory distress.     Breath sounds: Normal breath sounds.  Abdominal:     General: Bowel sounds are normal.     Palpations: Abdomen is soft. There is no hepatomegaly or splenomegaly.     Tenderness: There is no abdominal tenderness.  Musculoskeletal: Normal range of motion.     Right lower leg: No edema.     Left lower leg: No edema.  Lymphadenopathy:     Cervical: No cervical adenopathy.  Skin:    General: Skin is warm and dry.     Capillary Refill: Capillary refill takes less than 2 seconds.     Findings: No rash.  Neurological:     Mental Status: He is alert and oriented to person, place, and time.  Deep Tendon Reflexes: Reflexes are normal and symmetric.  Psychiatric:        Attention and Perception: Attention normal.        Mood and Affect: Mood normal.        Speech: Speech normal.        Behavior: Behavior normal. Behavior is cooperative.        Thought Content: Thought content normal.        Cognition and Memory: Cognition normal.        Judgment: Judgment normal.     6CIT Screen 08/20/2019 06/07/2018 06/08/2017  What Year?  0 points 0 points 0 points  What month? 0 points 0 points 0 points  What time? 0 points 0 points 0 points  Count back from 20 0 points 0 points 0 points  Months in reverse 0 points 0 points 0 points  Repeat phrase 0 points 2 points 2 points  Total Score 0 2 2    Results for orders placed or performed during the hospital encounter of 06/22/19  SARS CORONAVIRUS 2 Nasal Swab   Specimen: Nasal Swab  Result Value Ref Range   SARS Coronavirus 2 NEGATIVE NEGATIVE      Assessment & Plan:   Problem List Items Addressed This Visit      Cardiovascular and Mediastinum   Hypertension    Chronic, stable with BP below goal.  Continue current medication regimen and adjust as needed.  Monitor for hypotension, avoid this due to fall risk.  Labs today.      Purpura senilis ([place])    Continues on daily ASA. Recommend use of gentle skin cleansers at home and monitor for wounds, if present notify provider.        Endocrine   Primary hyperparathyroidism ([place])    Labs today . Continue current medication regimen.        Musculoskeletal and Integument   Senile osteoporosis    Continue collaboration with endocrinology.        Genitourinary   CKD (chronic kidney disease) stage 3, GFR 30-59 ml/min (HCC)    Chronic, stable.  No recent decline.  Continue to monitor and continue Lisinopril for kidney protection.  Labs today.        Other   Hyperlipemia    Chronic, stable with diet control.  Continue to monitor and diet focus.       Other Visit Diagnoses    Annual physical exam    -  Primary   Need for influenza vaccination       Relevant Orders   Flu Vaccine QUAD High Dose(Fluad) (Completed)      Discussed aspirin prophylaxis for myocardial infarction prevention and decision was made to continue ASA  LABORATORY TESTING:  Health maintenance labs ordered today as discussed above.   IMMUNIZATIONS:   - Tdap: Tetanus vaccination status reviewed: last tetanus booster within 10 years. -  Influenza: Up to date - Pneumovax: up to date - Prevnar: Up to date - Zostavax vaccine: Up to date  SCREENING: - Colonoscopy: Up to date  Discussed with patient purpose of the colonoscopy is to detect colon cancer at curable precancerous or early stages   - AAA Screening: Not applicable  -Hearing Test: Not applicable  -Spirometry: Not applicable   PATIENT COUNSELING:    Sexuality: Discussed sexually transmitted diseases, partner selection, use of condoms, avoidance of unintended pregnancy  and contraceptive alternatives.   Advised to avoid cigarette smoking.  I discussed with the patient that most people either  abstain from alcohol or drink within safe limits (<=14/week and <=4 drinks/occasion for males, <=7/weeks and <= 3 drinks/occasion for females) and that the risk for alcohol disorders and other health effects rises proportionally with the number of drinks per week and how often a drinker exceeds daily limits.  Discussed cessation/primary prevention of drug use and availability of treatment for abuse.   Diet: Encouraged to adjust caloric intake to maintain  or achieve ideal body weight, to reduce intake of dietary saturated fat and total fat, to limit sodium intake by avoiding high sodium foods and not adding table salt, and to maintain adequate dietary potassium and calcium preferably from fresh fruits, vegetables, and low-fat dairy products.    stressed the importance of regular exercise  Injury prevention: Discussed safety belts, safety helmets, smoke detector, smoking near bedding or upholstery.   Dental health: Discussed importance of regular tooth brushing, flossing, and dental visits.   Follow up plan: NEXT PREVENTATIVE PHYSICAL DUE IN 1 YEAR. Return in about 6 months (around 02/17/2020) for HTN/HLD, CKD 3, GERD, Senile osteoporosis.

## 2019-08-20 NOTE — Assessment & Plan Note (Signed)
Continues on daily ASA. Recommend use of gentle skin cleansers at home and monitor for wounds, if present notify provider. 

## 2019-08-20 NOTE — Assessment & Plan Note (Signed)
Chronic, stable with BP below goal.  Continue current medication regimen and adjust as needed.  Monitor for hypotension, avoid this due to fall risk.  Labs today.

## 2019-08-20 NOTE — Patient Instructions (Addendum)
[place] BLOOD PRESSURE CUFF SERIES 3 TO 10  DEBROX FOR EAR CLEANING  DASH Eating Plan DASH stands for "Dietary Approaches to Stop Hypertension." The DASH eating plan is a healthy eating plan that has been shown to reduce high blood pressure (hypertension). It may also reduce your risk for type 2 diabetes, heart disease, and stroke. The DASH eating plan may also help with weight loss. What are tips for following this plan?  General guidelines  Avoid eating more than 2,300 mg (milligrams) of salt (sodium) a day. If you have hypertension, you may need to reduce your sodium intake to 1,500 mg a day.  Limit alcohol intake to no more than 1 drink a day for nonpregnant women and 2 drinks a day for men. One drink equals 12 oz of beer, 5 oz of wine, or 1 oz of hard liquor.  Work with your health care provider to maintain a healthy body weight or to lose weight. Ask what an ideal weight is for you.  Get at least 30 minutes of exercise that causes your heart to beat faster (aerobic exercise) most days of the week. Activities may include walking, swimming, or biking.  Work with your health care provider or diet and nutrition specialist (dietitian) to adjust your eating plan to your individual calorie needs. Reading food labels   Check food labels for the amount of sodium per serving. Choose foods with less than 5 percent of the Daily Value of sodium. Generally, foods with less than 300 mg of sodium per serving fit into this eating plan.  To find whole grains, look for the word "whole" as the first word in the ingredient list. Shopping  Buy products labeled as "low-sodium" or "no salt added."  Buy fresh foods. Avoid canned foods and premade or frozen meals. Cooking  Avoid adding salt when cooking. Use salt-free seasonings or herbs instead of table salt or sea salt. Check with your health care provider or pharmacist before using salt substitutes.  Do not fry foods. Cook foods using healthy  methods such as baking, boiling, grilling, and broiling instead.  Cook with heart-healthy oils, such as olive, canola, soybean, or sunflower oil. Meal planning  Eat a balanced diet that includes: ? 5 or more servings of fruits and vegetables each day. At each meal, try to fill half of your plate with fruits and vegetables. ? Up to 6-8 servings of whole grains each day. ? Less than 6 oz of lean meat, poultry, or fish each day. A 3-oz serving of meat is about the same size as a deck of cards. One egg equals 1 oz. ? 2 servings of low-fat dairy each day. ? A serving of nuts, seeds, or beans 5 times each week. ? Heart-healthy fats. Healthy fats called Omega-3 fatty acids are found in foods such as flaxseeds and coldwater fish, like sardines, salmon, and mackerel.  Limit how much you eat of the following: ? Canned or prepackaged foods. ? Food that is high in trans fat, such as fried foods. ? Food that is high in saturated fat, such as fatty meat. ? Sweets, desserts, sugary drinks, and other foods with added sugar. ? Full-fat dairy products.  Do not salt foods before eating.  Try to eat at least 2 vegetarian meals each week.  Eat more home-cooked food and less restaurant, buffet, and fast food.  When eating at a restaurant, ask that your food be prepared with less salt or no salt, if possible. What foods are recommended?  The items listed may not be a complete list. Talk with your dietitian about what dietary choices are best for you. Grains Whole-grain or whole-wheat bread. Whole-grain or whole-wheat pasta. Brown rice. [place]. Bulgur. Whole-grain and low-sodium cereals. Pita bread. Low-fat, low-sodium crackers. Whole-wheat flour tortillas. Vegetables Fresh or frozen vegetables (raw, steamed, roasted, or grilled). Low-sodium or reduced-sodium tomato and vegetable juice. Low-sodium or reduced-sodium tomato sauce and tomato paste. Low-sodium or reduced-sodium canned vegetables. Fruits  All fresh, dried, or frozen fruit. Canned fruit in natural juice (without added sugar). Meat and other protein foods Skinless chicken or [place]. Ground chicken or [place]. Pork with fat trimmed off. Fish and seafood. Egg whites. Dried beans, peas, or lentils. Unsalted nuts, nut butters, and seeds. Unsalted canned beans. Lean cuts of beef with fat trimmed off. Low-sodium, lean deli meat. Dairy Low-fat (1%) or fat-free (skim) milk. Fat-free, low-fat, or reduced-fat cheeses. Nonfat, low-sodium ricotta or cottage cheese. Low-fat or nonfat yogurt. Low-fat, low-sodium cheese. Fats and oils Soft margarine without trans fats. Vegetable oil. Low-fat, reduced-fat, or light mayonnaise and salad dressings (reduced-sodium). Canola, safflower, olive, soybean, and sunflower oils. Avocado. Seasoning and other foods Herbs. Spices. Seasoning mixes without salt. Unsalted popcorn and pretzels. Fat-free sweets. What foods are not recommended? The items listed may not be a complete list. Talk with your dietitian about what dietary choices are best for you. Grains Baked goods made with fat, such as croissants, muffins, or some breads. Dry pasta or rice meal packs. Vegetables Creamed or fried vegetables. Vegetables in a cheese sauce. Regular canned vegetables (not low-sodium or reduced-sodium). Regular canned tomato sauce and paste (not low-sodium or reduced-sodium). Regular tomato and vegetable juice (not low-sodium or reduced-sodium). Angie Fava. Olives. Fruits Canned fruit in a light or heavy syrup. Fried fruit. Fruit in cream or butter sauce. Meat and other protein foods Fatty cuts of meat. Ribs. Fried meat. Berniece Salines. Sausage. Bologna and other processed lunch meats. Salami. Fatback. Hotdogs. Bratwurst. Salted nuts and seeds. Canned beans with added salt. Canned or smoked fish. Whole eggs or egg yolks. Chicken or [place] with skin. Dairy Whole or 2% milk, cream, and half-and-half. Whole or full-fat cream cheese. Whole-fat  or sweetened yogurt. Full-fat cheese. Nondairy creamers. Whipped toppings. Processed cheese and cheese spreads. Fats and oils Butter. Stick margarine. Lard. Shortening. Ghee. Bacon fat. Tropical oils, such as coconut, palm kernel, or palm oil. Seasoning and other foods Salted popcorn and pretzels. Onion salt, garlic salt, seasoned salt, table salt, and sea salt. Worcestershire sauce. Tartar sauce. Barbecue sauce. Teriyaki sauce. Soy sauce, including reduced-sodium. Steak sauce. Canned and packaged gravies. Fish sauce. Oyster sauce. Cocktail sauce. Horseradish that you find on the shelf. Ketchup. Mustard. Meat flavorings and tenderizers. Bouillon cubes. Hot sauce and Tabasco sauce. Premade or packaged marinades. Premade or packaged taco seasonings. Relishes. Regular salad dressings. Where to find more information:  National Heart, Lung, and [place]: https://wilson-eaton.com/  American Heart Association: www.heart.org Summary  The DASH eating plan is a healthy eating plan that has been shown to reduce high blood pressure (hypertension). It may also reduce your risk for type 2 diabetes, heart disease, and stroke.  With the DASH eating plan, you should limit salt (sodium) intake to 2,300 mg a day. If you have hypertension, you may need to reduce your sodium intake to 1,500 mg a day.  When on the DASH eating plan, aim to eat more fresh fruits and vegetables, whole grains, lean proteins, low-fat dairy, and heart-healthy fats.  Work with your health care  provider or diet and nutrition specialist (dietitian) to adjust your eating plan to your individual calorie needs. This information is not intended to replace advice given to you by your health care provider. Make sure you discuss any questions you have with your health care provider. Document Released: 10/28/2011 Document Revised: 10/21/2017 Document Reviewed: 11/01/2016 Elsevier Patient Education  [place]. Influenza (Flu) Vaccine  (Inactivated or Recombinant): What You Need to Know 1. Why get vaccinated? Influenza vaccine can prevent influenza (flu). Flu is a contagious disease that spreads around the [place] every year, usually between October and May. Anyone can get the flu, but it is more dangerous for some people. Infants and young children, people 89 years of age and older, pregnant women, and people with certain health conditions or a weakened immune system are at greatest risk of flu complications. Pneumonia, bronchitis, sinus infections and ear infections are examples of flu-related complications. If you have a medical condition, such as heart disease, cancer or diabetes, flu can make it worse. Flu can cause fever and chills, sore throat, muscle aches, fatigue, cough, headache, and runny or stuffy nose. Some people may have vomiting and diarrhea, though this is more common in children than adults. Each year thousands of people in the [place] die from flu, and many more are hospitalized. Flu vaccine prevents millions of illnesses and flu-related visits to the doctor each year. 2. Influenza vaccine CDC recommends everyone 85 months of age and older get vaccinated every flu season. Children 6 months through 21 years of age may need 2 doses during a single flu season. Everyone else needs only 1 dose each flu season. It takes about 2 weeks for protection to develop after vaccination. There are many flu viruses, and they are always changing. Each year a new flu vaccine is made to protect against three or four viruses that are likely to cause disease in the upcoming flu season. Even when the vaccine doesn't exactly match these viruses, it may still provide some protection. Influenza vaccine does not cause flu. Influenza vaccine may be given at the same time as other vaccines. 3. Talk with your health care provider Tell your vaccine provider if the person getting the vaccine:  Has had an allergic reaction after a  previous dose of influenza vaccine, or has any severe, life-threatening allergies.  Has ever had Guillain-Barr Syndrome (also called GBS). In some cases, your health care provider may decide to postpone influenza vaccination to a future visit. People with minor illnesses, such as a cold, may be vaccinated. People who are moderately or severely ill should usually wait until they recover before getting influenza vaccine. Your health care provider can give you more information. 4. Risks of a vaccine reaction  Soreness, redness, and swelling where shot is given, fever, muscle aches, and headache can happen after influenza vaccine.  There may be a very small increased risk of Guillain-Barr Syndrome (GBS) after inactivated influenza vaccine (the flu shot). Young children who get the flu shot along with pneumococcal vaccine (PCV13), and/or DTaP vaccine at the same time might be slightly more likely to have a seizure caused by fever. Tell your health care provider if a child who is getting flu vaccine has ever had a seizure. People sometimes faint after medical procedures, including vaccination. Tell your provider if you feel dizzy or have vision changes or ringing in the ears. As with any medicine, there is a very remote chance of a vaccine causing a severe allergic reaction,  other serious injury, or death. 5. What if there is a serious problem? An allergic reaction could occur after the vaccinated person leaves the clinic. If you see signs of a severe allergic reaction (hives, swelling of the face and throat, difficulty breathing, a fast heartbeat, dizziness, or weakness), call 9-1-1 and get the person to the nearest hospital. For other signs that concern you, call your health care provider. Adverse reactions should be reported to the Vaccine Adverse Event Reporting System (VAERS). Your health care provider will usually file this report, or you can do it yourself. Visit the VAERS website at  www.vaers.SamedayNews.es or call 979-140-6151.VAERS is only for reporting reactions, and VAERS staff do not give medical advice. 6. The National Vaccine Injury Compensation Program The [employer] Vaccine Injury Compensation Program (VICP) is a federal program that was created to compensate people who may have been injured by certain vaccines. Visit the VICP website at GoldCloset.com.ee or call 763-140-3571 to learn about the program and about filing a claim. There is a time limit to file a claim for compensation. 7. How can I learn more?  Ask your healthcare provider.  Call your local or state health department.  Contact the Centers for Disease Control and Prevention (CDC): ? Call 916-396-1793 (1-800-CDC-INFO) or ? Visit CDC's https://gibson.com/ Vaccine Information Statement (Interim) Inactivated Influenza Vaccine (07/06/2018) This information is not intended to replace advice given to you by your health care provider. Make sure you discuss any questions you have with your health care provider. Document Released: 09/02/2006 Document Revised: 02/27/2019 Document Reviewed: 07/10/2018 Elsevier Patient Education  2020 [employer].

## 2019-08-21 LAB — CBC WITH DIFFERENTIAL/PLATELET
Basophils Absolute: 0.1 10*3/uL (ref 0.0–0.2)
Basos: 1 %
EOS (ABSOLUTE): 0.3 10*3/uL (ref 0.0–0.4)
Eos: 4 %
Hematocrit: 45.2 % (ref 37.5–51.0)
Hemoglobin: 15.1 g/dL (ref 13.0–17.7)
Immature Grans (Abs): 0 10*3/uL (ref 0.0–0.1)
Immature Granulocytes: 1 %
Lymphocytes Absolute: 1.6 10*3/uL (ref 0.7–3.1)
Lymphs: 21 %
MCH: 30.7 pg (ref 26.6–33.0)
MCHC: 33.4 g/dL (ref 31.5–35.7)
MCV: 92 fL (ref 79–97)
Monocytes Absolute: 0.6 10*3/uL (ref 0.1–0.9)
Monocytes: 8 %
Neutrophils Absolute: 4.8 10*3/uL (ref 1.4–7.0)
Neutrophils: 65 %
Platelets: 211 10*3/uL (ref 150–450)
RBC: 4.92 x10E6/uL (ref 4.14–5.80)
RDW: 12.8 % (ref 11.6–15.4)
WBC: 7.4 10*3/uL (ref 3.4–10.8)

## 2019-08-21 LAB — LIPID PANEL
Chol/HDL Ratio: 4.7 ratio (ref 0.0–5.0)
Cholesterol, Total: 218 mg/dL — ABNORMAL HIGH (ref 100–199)
HDL: 46 mg/dL (ref >39)
LDL Chol Calc (NIH): 136 mg/dL — ABNORMAL HIGH (ref 0–99)
Triglycerides: 201 mg/dL — ABNORMAL HIGH (ref 0–149)
VLDL Cholesterol Cal: 36 mg/dL (ref 5–40)

## 2019-08-21 LAB — TSH: TSH: 4.54 u[IU]/mL — ABNORMAL HIGH (ref 0.450–4.500)

## 2019-08-21 LAB — COMPREHENSIVE METABOLIC PANEL
ALT: 27 IU/L (ref 0–44)
AST: 28 IU/L (ref 0–40)
Albumin/Globulin Ratio: 1.7 (ref 1.2–2.2)
Albumin: 4.5 g/dL (ref 3.6–4.6)
Alkaline Phosphatase: 73 IU/L (ref 39–117)
BUN/Creatinine Ratio: 10 (ref 10–24)
BUN: 15 mg/dL (ref 8–27)
Bilirubin Total: 0.4 mg/dL (ref 0.0–1.2)
CO2: 23 mmol/L (ref 20–29)
Calcium: 9.7 mg/dL (ref 8.6–10.2)
Chloride: 101 mmol/L (ref 96–106)
Creatinine, Ser: 1.43 mg/dL — ABNORMAL HIGH (ref 0.76–1.27)
GFR calc Af Amer: 52 mL/min/{1.73_m2} — ABNORMAL LOW (ref >59)
GFR calc non Af Amer: 45 mL/min/{1.73_m2} — ABNORMAL LOW (ref >59)
Globulin, Total: 2.7 g/dL (ref 1.5–4.5)
Glucose: 93 mg/dL (ref 65–99)
Potassium: 4.3 mmol/L (ref 3.5–5.2)
Sodium: 138 mmol/L (ref 134–144)
Total Protein: 7.2 g/dL (ref 6.0–8.5)

## 2019-08-22 ENCOUNTER — Encounter: Payer: Self-pay | Admitting: Family Medicine

## 2019-08-22 ENCOUNTER — Other Ambulatory Visit: Payer: Self-pay | Admitting: Family Medicine

## 2019-08-22 DIAGNOSIS — R911 Solitary pulmonary nodule: Secondary | ICD-10-CM

## 2019-08-22 DIAGNOSIS — R7989 Other specified abnormal findings of blood chemistry: Secondary | ICD-10-CM

## 2019-08-22 DIAGNOSIS — E210 Primary hyperparathyroidism: Secondary | ICD-10-CM

## 2019-08-22 DIAGNOSIS — E039 Hypothyroidism, unspecified: Secondary | ICD-10-CM

## 2019-08-22 DIAGNOSIS — N183 Chronic kidney disease, stage 3 unspecified: Secondary | ICD-10-CM

## 2019-08-27 ENCOUNTER — Telehealth: Payer: Self-pay | Admitting: *Deleted

## 2019-08-27 NOTE — Telephone Encounter (Signed)
Made follow up call to patient to see if he is ready to reschedule his CT scan in the lung nodule clinic. Pt did not answer. Message left for pt to call back. Awaiting call back.

## 2019-08-28 ENCOUNTER — Ambulatory Visit: Payer: Medicare Other | Attending: Oncology

## 2020-01-08 ENCOUNTER — Telehealth: Payer: Self-pay | Admitting: *Deleted

## 2020-01-08 NOTE — Telephone Encounter (Signed)
Message left with patient to see if he was ready to reschedule his appts for follow up CT scan in the lung nodule clinic. Pt instructed to call back to get appts rescheduled.

## 2020-03-03 DIAGNOSIS — H04123 Dry eye syndrome of bilateral lacrimal glands: Secondary | ICD-10-CM | POA: Diagnosis not present

## 2020-09-04 ENCOUNTER — Telehealth: Payer: Self-pay | Admitting: Nurse Practitioner

## 2020-09-04 DIAGNOSIS — I10 Essential (primary) hypertension: Secondary | ICD-10-CM

## 2020-09-04 NOTE — Telephone Encounter (Signed)
Medication Refill - Medication: lisinopril (ZESTRIL) 10 MG tablet   Preferred Pharmacy (with phone number or street name):  [facility], [place] Phone:  [phone]  Fax:  [phone]       Agent: Please be advised that RX refills may take up to 3 business days. We ask that you follow-up with your pharmacy.

## 2020-09-05 NOTE — Telephone Encounter (Signed)
Lvm to make this apt. 

## 2020-09-05 NOTE — Telephone Encounter (Signed)
Pts wife called and is requesting to have a short supply for this medication until the pts appt. Please advise.

## 2020-09-08 ENCOUNTER — Other Ambulatory Visit: Payer: Self-pay | Admitting: Nurse Practitioner

## 2020-09-08 DIAGNOSIS — I10 Essential (primary) hypertension: Secondary | ICD-10-CM

## 2020-09-08 MED ORDER — LISINOPRIL 10 MG PO TABS: 10.0000 mg | ORAL_TABLET | Freq: Every day | ORAL | 4 refills | Status: DC

## 2020-09-08 NOTE — Telephone Encounter (Signed)
Pt apt scheduled for 09/18/2020

## 2020-09-18 ENCOUNTER — Other Ambulatory Visit: Payer: Self-pay

## 2020-09-18 ENCOUNTER — Encounter: Payer: Self-pay | Admitting: Nurse Practitioner

## 2020-09-18 ENCOUNTER — Ambulatory Visit (INDEPENDENT_AMBULATORY_CARE_PROVIDER_SITE_OTHER): Payer: Medicare Other | Admitting: Nurse Practitioner

## 2020-09-18 VITALS — BP 112/75 | HR 78 | Temp 97.5°F | Resp 16 | Wt 159.0 lb

## 2020-09-18 DIAGNOSIS — I10 Essential (primary) hypertension: Secondary | ICD-10-CM | POA: Diagnosis not present

## 2020-09-18 DIAGNOSIS — E210 Primary hyperparathyroidism: Secondary | ICD-10-CM | POA: Diagnosis not present

## 2020-09-18 DIAGNOSIS — N1831 Chronic kidney disease, stage 3a: Secondary | ICD-10-CM

## 2020-09-18 DIAGNOSIS — M810 Age-related osteoporosis without current pathological fracture: Secondary | ICD-10-CM | POA: Diagnosis not present

## 2020-09-18 DIAGNOSIS — R918 Other nonspecific abnormal finding of lung field: Secondary | ICD-10-CM | POA: Diagnosis not present

## 2020-09-18 DIAGNOSIS — R7301 Impaired fasting glucose: Secondary | ICD-10-CM

## 2020-09-18 DIAGNOSIS — D692 Other nonthrombocytopenic purpura: Secondary | ICD-10-CM

## 2020-09-18 DIAGNOSIS — E782 Mixed hyperlipidemia: Secondary | ICD-10-CM | POA: Diagnosis not present

## 2020-09-18 DIAGNOSIS — R7989 Other specified abnormal findings of blood chemistry: Secondary | ICD-10-CM | POA: Insufficient documentation

## 2020-09-18 DIAGNOSIS — E039 Hypothyroidism, unspecified: Secondary | ICD-10-CM | POA: Insufficient documentation

## 2020-09-18 MED ORDER — PANTOPRAZOLE SODIUM 40 MG PO TBEC: 40.0000 mg | DELAYED_RELEASE_TABLET | Freq: Every day | ORAL | 4 refills | Status: DC

## 2020-09-18 NOTE — Assessment & Plan Note (Signed)
Denies symptoms, noted on past labs.  Recheck TSH and Free T4 today.

## 2020-09-18 NOTE — Assessment & Plan Note (Signed)
Chronic, stable.  No recent decline.  Continue to monitor and continue Lisinopril for kidney protection.  Labs today.

## 2020-09-18 NOTE — Assessment & Plan Note (Signed)
Continue collaboration with endocrinology, recommend he follow-up with them.

## 2020-09-18 NOTE — Assessment & Plan Note (Addendum)
Ongoing. Continue current medication regimen with Lisinopril for kidney protection.  Plan on PTH level next visit.

## 2020-09-18 NOTE — Patient Instructions (Signed)
DASH Eating Plan DASH stands for "Dietary Approaches to Stop Hypertension." The DASH eating plan is a healthy eating plan that has been shown to reduce high blood pressure (hypertension). It may also reduce your risk for type 2 diabetes, heart disease, and stroke. The DASH eating plan may also help with weight loss. What are tips for following this plan?  General guidelines  Avoid eating more than 2,300 mg (milligrams) of salt (sodium) a day. If you have hypertension, you may need to reduce your sodium intake to 1,500 mg a day.  Limit alcohol intake to no more than 1 drink a day for nonpregnant women and 2 drinks a day for men. One drink equals 12 oz of beer, 5 oz of wine, or 1 oz of hard liquor.  Work with your health care provider to maintain a healthy body weight or to lose weight. Ask what an ideal weight is for you.  Get at least 30 minutes of exercise that causes your heart to beat faster (aerobic exercise) most days of the week. Activities may include walking, swimming, or biking.  Work with your health care provider or diet and nutrition specialist (dietitian) to adjust your eating plan to your individual calorie needs. Reading food labels   Check food labels for the amount of sodium per serving. Choose foods with less than 5 percent of the Daily Value of sodium. Generally, foods with less than 300 mg of sodium per serving fit into this eating plan.  To find whole grains, look for the word "whole" as the first word in the ingredient list. Shopping  Buy products labeled as "low-sodium" or "no salt added."  Buy fresh foods. Avoid canned foods and premade or frozen meals. Cooking  Avoid adding salt when cooking. Use salt-free seasonings or herbs instead of table salt or sea salt. Check with your health care provider or pharmacist before using salt substitutes.  Do not fry foods. Cook foods using healthy methods such as baking, boiling, grilling, and broiling instead.  Cook with  heart-healthy oils, such as olive, canola, soybean, or sunflower oil. Meal planning  Eat a balanced diet that includes: ? 5 or more servings of fruits and vegetables each day. At each meal, try to fill half of your plate with fruits and vegetables. ? Up to 6-8 servings of whole grains each day. ? Less than 6 oz of lean meat, poultry, or fish each day. A 3-oz serving of meat is about the same size as a deck of cards. One egg equals 1 oz. ? 2 servings of low-fat dairy each day. ? A serving of nuts, seeds, or beans 5 times each week. ? Heart-healthy fats. Healthy fats called Omega-3 fatty acids are found in foods such as flaxseeds and coldwater fish, like sardines, salmon, and mackerel.  Limit how much you eat of the following: ? Canned or prepackaged foods. ? Food that is high in trans fat, such as fried foods. ? Food that is high in saturated fat, such as fatty meat. ? Sweets, desserts, sugary drinks, and other foods with added sugar. ? Full-fat dairy products.  Do not salt foods before eating.  Try to eat at least 2 vegetarian meals each week.  Eat more home-cooked food and less restaurant, buffet, and fast food.  When eating at a restaurant, ask that your food be prepared with less salt or no salt, if possible. What foods are recommended? The items listed may not be a complete list. Talk with your dietitian about   what dietary choices are best for you. Grains Whole-grain or whole-wheat bread. Whole-grain or whole-wheat pasta. Brown rice. Oatmeal. Quinoa. Bulgur. Whole-grain and low-sodium cereals. Pita bread. Low-fat, low-sodium crackers. Whole-wheat flour tortillas. Vegetables Fresh or frozen vegetables (raw, steamed, roasted, or grilled). Low-sodium or reduced-sodium tomato and vegetable juice. Low-sodium or reduced-sodium tomato sauce and tomato paste. Low-sodium or reduced-sodium canned vegetables. Fruits All fresh, dried, or frozen fruit. Canned fruit in natural juice (without  added sugar). Meat and other protein foods Skinless chicken or turkey. Ground chicken or turkey. Pork with fat trimmed off. Fish and seafood. Egg whites. Dried beans, peas, or lentils. Unsalted nuts, nut butters, and seeds. Unsalted canned beans. Lean cuts of beef with fat trimmed off. Low-sodium, lean deli meat. Dairy Low-fat (1%) or fat-free (skim) milk. Fat-free, low-fat, or reduced-fat cheeses. Nonfat, low-sodium ricotta or cottage cheese. Low-fat or nonfat yogurt. Low-fat, low-sodium cheese. Fats and oils Soft margarine without trans fats. Vegetable oil. Low-fat, reduced-fat, or light mayonnaise and salad dressings (reduced-sodium). Canola, safflower, olive, soybean, and sunflower oils. Avocado. Seasoning and other foods Herbs. Spices. Seasoning mixes without salt. Unsalted popcorn and pretzels. Fat-free sweets. What foods are not recommended? The items listed may not be a complete list. Talk with your dietitian about what dietary choices are best for you. Grains Baked goods made with fat, such as croissants, muffins, or some breads. Dry pasta or rice meal packs. Vegetables Creamed or fried vegetables. Vegetables in a cheese sauce. Regular canned vegetables (not low-sodium or reduced-sodium). Regular canned tomato sauce and paste (not low-sodium or reduced-sodium). Regular tomato and vegetable juice (not low-sodium or reduced-sodium). Pickles. Olives. Fruits Canned fruit in a light or heavy syrup. Fried fruit. Fruit in cream or butter sauce. Meat and other protein foods Fatty cuts of meat. Ribs. Fried meat. Bacon. Sausage. Bologna and other processed lunch meats. Salami. Fatback. Hotdogs. Bratwurst. Salted nuts and seeds. Canned beans with added salt. Canned or smoked fish. Whole eggs or egg yolks. Chicken or turkey with skin. Dairy Whole or 2% milk, cream, and half-and-half. Whole or full-fat cream cheese. Whole-fat or sweetened yogurt. Full-fat cheese. Nondairy creamers. Whipped toppings.  Processed cheese and cheese spreads. Fats and oils Butter. Stick margarine. Lard. Shortening. Ghee. Bacon fat. Tropical oils, such as coconut, palm kernel, or palm oil. Seasoning and other foods Salted popcorn and pretzels. Onion salt, garlic salt, seasoned salt, table salt, and sea salt. Worcestershire sauce. Tartar sauce. Barbecue sauce. Teriyaki sauce. Soy sauce, including reduced-sodium. Steak sauce. Canned and packaged gravies. Fish sauce. Oyster sauce. Cocktail sauce. Horseradish that you find on the shelf. Ketchup. Mustard. Meat flavorings and tenderizers. Bouillon cubes. Hot sauce and Tabasco sauce. Premade or packaged marinades. Premade or packaged taco seasonings. Relishes. Regular salad dressings. Where to find more information:  National Heart, Lung, and Blood Institute: www.nhlbi.nih.gov  American Heart Association: www.heart.org Summary  The DASH eating plan is a healthy eating plan that has been shown to reduce high blood pressure (hypertension). It may also reduce your risk for type 2 diabetes, heart disease, and stroke.  With the DASH eating plan, you should limit salt (sodium) intake to 2,300 mg a day. If you have hypertension, you may need to reduce your sodium intake to 1,500 mg a day.  When on the DASH eating plan, aim to eat more fresh fruits and vegetables, whole grains, lean proteins, low-fat dairy, and heart-healthy fats.  Work with your health care provider or diet and nutrition specialist (dietitian) to adjust your eating plan to your   individual calorie needs. This information is not intended to replace advice given to you by your health care provider. Make sure you discuss any questions you have with your health care provider. Document Revised: 10/21/2017 Document Reviewed: 11/01/2016 Elsevier Patient Education  2020 Elsevier Inc.  

## 2020-09-18 NOTE — Assessment & Plan Note (Signed)
Chronic, stable with BP below goal.  Continue current medication regimen and adjust as needed.  Monitor for hypotension, avoid this due to fall risk. Recommend he continue to monitor BP at home at least a few times a week and document + focus on DASH diet.  Labs today.  Return in 6 months.

## 2020-09-18 NOTE — Progress Notes (Signed)
BP 112/75 (BP Location: Left Arm, Patient Position: Sitting, Cuff Size: Normal)   Pulse 78   Temp (!) 97.5 F (36.4 C) (Oral)   Resp 16   Wt 159 lb (72.1 kg)   SpO2 99%   BMI 24.10 kg/m    Subjective:    Patient ID: Vincent Koch, male    DOB: 06-Feb-1934, 86 y.o.   MRN: [id number]  HPI: Vincent Koch is a 86 y.o. male  Chief Complaint  Patient presents with  . Hypertension  . Hyperlipidemia   HYPERTENSION / HYPERLIPIDEMIA Continues on ASA, Lisinopril, no current statin.  Diet controlled HLD.  Last thyroid check in September 2020 -- 4.540.  He has a history in February of 2020 of an abdominal CT that noted lingula 12-14 mm lung nodule, was recommended to repeat in 3 months, but he missed this and they were unable to contact him.  Discussed with him today and reviewed scan -- at this time he does not wish to pursue imaging, wishes to wait and will alert provider if he changes mind. Satisfied with current treatment? yes Duration of hypertension: chronic BP monitoring frequency: rarely BP range: 112-115/70 BP medication side effects: no Duration of hyperlipidemia: chronic Aspirin: yes Recent stressors: no Recurrent headaches: no Visual changes: no Palpitations: no Dyspnea: no Chest pain: no Lower extremity edema: no Dizzy/lightheaded: no   CHRONIC KIDNEY DISEASE Continues on Lisinopril for kidney protection. CKD status: stable Medications renally dose: yes Previous renal evaluation: no Pneumovax:  Up to Date Influenza Vaccine:  Up to Date   SENILE OSTEOPOROSIS:  Followed by endocrinology with last visit 12/27/2018, his DEXA was at this time too. Continues on calcium and Vit D.  Did not tolerate bisphosphonate medications.  No recent or fractures.    Relevant past medical, surgical, family and social history reviewed and updated as indicated. Interim medical history since our last visit reviewed. Allergies and medications reviewed and updated.  Review of  Systems  Constitutional: Negative for activity change, diaphoresis, fatigue and fever.  Respiratory: Negative for cough, chest tightness, shortness of breath and wheezing.   Cardiovascular: Negative for chest pain, palpitations and leg swelling.  Gastrointestinal: Negative.   Endocrine: Negative for cold intolerance and heat intolerance.  Neurological: Negative.   Psychiatric/Behavioral: Negative.     Per HPI unless specifically indicated above     Objective:    BP 112/75 (BP Location: Left Arm, Patient Position: Sitting, Cuff Size: Normal)   Pulse 78   Temp (!) 97.5 F (36.4 C) (Oral)   Resp 16   Wt 159 lb (72.1 kg)   SpO2 99%   BMI 24.10 kg/m   Wt Readings from Last 3 Encounters:  09/18/20 159 lb (72.1 kg)  08/20/19 161 lb 3.2 oz (73.1 kg)  06/27/19 160 lb (72.6 kg)    Physical Exam Vitals and nursing note reviewed.  Constitutional:      General: He is awake. He is not in acute distress.    Appearance: He is well-developed and well-groomed. He is not ill-appearing.  HENT:     Head: Normocephalic and atraumatic.     Right Ear: Hearing normal. No drainage.     Left Ear: Hearing normal. No drainage.  Eyes:     General: Lids are normal.        Right eye: No discharge.        Left eye: No discharge.     Conjunctiva/sclera: Conjunctivae normal.     Pupils: Pupils are equal,  round, and reactive to light.  Neck:     Thyroid: No thyromegaly.     Vascular: No carotid bruit.     Trachea: Trachea normal.  Cardiovascular:     Rate and Rhythm: Normal rate and regular rhythm.     Heart sounds: Normal heart sounds, S1 normal and S2 normal. No murmur heard.  No gallop.   Pulmonary:     Effort: Pulmonary effort is normal. No accessory muscle usage or respiratory distress.     Breath sounds: Normal breath sounds.  Abdominal:     General: Bowel sounds are normal.     Palpations: Abdomen is soft.  Musculoskeletal:        General: Normal range of motion.     Cervical back:  Normal range of motion and neck supple.     Right lower leg: No edema.     Left lower leg: No edema.  Skin:    General: Skin is warm and dry.     Capillary Refill: Capillary refill takes less than 2 seconds.     Findings: No rash.  Neurological:     Mental Status: He is alert and oriented to person, place, and time.     Deep Tendon Reflexes: Reflexes are normal and symmetric.  Psychiatric:        Attention and Perception: Attention normal.        Mood and Affect: Mood normal.        Speech: Speech normal.        Behavior: Behavior normal. Behavior is cooperative.        Thought Content: Thought content normal.     Results for orders placed or performed in visit on 08/20/19  Microscopic Examination   URINE  Result Value Ref Range   WBC, UA 0-5 0 - 5 /hpf   RBC None seen 0 - 2 /hpf   Epithelial Cells (non renal) 0-10 0 - 10 /hpf   Bacteria, UA None seen None seen/Few  Urinalysis, Routine w reflex microscopic  Result Value Ref Range   Specific Gravity, UA 1.020 1.005 - 1.030   pH, UA 6.0 5.0 - 7.5   Color, UA Yellow Yellow   Appearance Ur Clear Clear   Leukocytes,UA 1+ (A) Negative   Protein,UA Negative Negative/Trace   Glucose, UA Negative Negative   Ketones, UA Negative Negative   RBC, UA Negative Negative   Bilirubin, UA Negative Negative   Urobilinogen, Ur 2.0 (H) 0.2 - 1.0 mg/dL   Nitrite, UA Negative Negative   Microscopic Examination See below:   TSH  Result Value Ref Range   TSH 4.540 (H) 0.450 - 4.500 uIU/mL  CBC with Differential/Platelet  Result Value Ref Range   WBC 7.4 3.4 - 10.8 x10E3/uL   RBC 4.92 4.14 - 5.80 x10E6/uL   Hemoglobin 15.1 13.0 - 17.7 g/dL   Hematocrit 45.2 37.5 - 51.0 %   MCV 92 79 - 97 fL   MCH 30.7 26.6 - 33.0 pg   MCHC 33.4 31 - 35 g/dL   RDW 12.8 11.6 - 15.4 %   Platelets 211 150 - 450 x10E3/uL   Neutrophils 65 Not Estab. %   Lymphs 21 Not Estab. %   Monocytes 8 Not Estab. %   Eos 4 Not Estab. %   Basos 1 Not Estab. %    Neutrophils Absolute 4.8 1.40 - 7.00 x10E3/uL   Lymphocytes Absolute 1.6 0 - 3 x10E3/uL   Monocytes Absolute 0.6 0 - 0 x10E3/uL  EOS (ABSOLUTE) 0.3 0.0 - 0.4 x10E3/uL   Basophils Absolute 0.1 0 - 0 x10E3/uL   Immature Granulocytes 1 Not Estab. %   Immature Grans (Abs) 0.0 0.0 - 0.1 x10E3/uL  Lipid panel  Result Value Ref Range   Cholesterol, Total 218 (H) 100 - 199 mg/dL   Triglycerides 201 (H) 0 - 149 mg/dL   HDL 46 >39 mg/dL   VLDL Cholesterol Cal 36 5 - 40 mg/dL   LDL Chol Calc (NIH) 136 (H) 0 - 99 mg/dL   Chol/HDL Ratio 4.7 0.0 - 5.0 ratio  Comprehensive metabolic panel  Result Value Ref Range   Glucose 93 65 - 99 mg/dL   BUN 15 8 - 27 mg/dL   Creatinine, Ser 1.43 (H) 0.76 - 1.27 mg/dL   GFR calc non Af Amer 45 (L) >59 mL/min/1.73   GFR calc Af Amer 52 (L) >59 mL/min/1.73   BUN/Creatinine Ratio 10 10 - 24   Sodium 138 134 - 144 mmol/L   Potassium 4.3 3.5 - 5.2 mmol/L   Chloride 101 96 - 106 mmol/L   CO2 23 20 - 29 mmol/L   Calcium 9.7 8.6 - 10.2 mg/dL   Total Protein 7.2 6.0 - 8.5 g/dL   Albumin 4.5 3.6 - 4.6 g/dL   Globulin, Total 2.7 1.5 - 4.5 g/dL   Albumin/Globulin Ratio 1.7 1.2 - 2.2   Bilirubin Total 0.4 0.0 - 1.2 mg/dL   Alkaline Phosphatase 73 39 - 117 IU/L   AST 28 0 - 40 IU/L   ALT 27 0 - 44 IU/L      Assessment & Plan:   Problem List Items Addressed This Visit      Cardiovascular and Mediastinum   Hypertension    Chronic, stable with BP below goal.  Continue current medication regimen and adjust as needed.  Monitor for hypotension, avoid this due to fall risk. Recommend he continue to monitor BP at home at least a few times a week and document + focus on DASH diet.  Labs today.  Return in 6 months.      Relevant Orders   Basic metabolic panel   TSH   T4, free   Purpura senilis ([place])    Continues on daily ASA. Recommend use of gentle skin cleansers at home and monitor for wounds, if present notify provider.        Endocrine   Primary  hyperparathyroidism ([place])    Ongoing. Continue current medication regimen with Lisinopril for kidney protection.  Plan on PTH level next visit.        Musculoskeletal and Integument   Senile osteoporosis    Continue collaboration with endocrinology, recommend he follow-up with them.        Genitourinary   CKD (chronic kidney disease) stage 3, GFR 30-59 ml/min ([facility]) - Primary    Chronic, stable.  No recent decline.  Continue to monitor and continue Lisinopril for kidney protection.  Labs today.        Other   Hyperlipemia    Chronic, stable with diet control.  Continue to monitor and diet focus.  Labs today.      Relevant Orders   Lipid Panel w/o Chol/HDL Ratio   Pulmonary nodules    February of 2020 of an abdominal CT that noted lingula 12-14 mm lung nodule, was recommended to repeat in 3 months, but he missed this and they were unable to contact him.  Discussed with him today and reviewed scan -- at this time  he does not wish to pursue further imaging.      Elevated TSH    Denies symptoms, noted on past labs.  Recheck TSH and Free T4 today.      Relevant Orders   T4, free       Follow up plan: Return in about 6 months (around 03/19/2021) for HTN/HLD, ELEVATED TSH, LUNG NODULE -- meet new PCP.

## 2020-09-18 NOTE — Assessment & Plan Note (Addendum)
February of 2020 of an abdominal CT that noted lingula 12-14 mm lung nodule, was recommended to repeat in 3 months, but he missed this and they were unable to contact him.  Discussed with him today and reviewed scan -- at this time he does not wish to pursue further imaging.

## 2020-09-18 NOTE — Assessment & Plan Note (Signed)
Continues on daily ASA. Recommend use of gentle skin cleansers at home and monitor for wounds, if present notify provider.

## 2020-09-18 NOTE — Assessment & Plan Note (Signed)
Chronic, stable with diet control.  Continue to monitor and diet focus.  Labs today.

## 2020-09-19 LAB — BASIC METABOLIC PANEL
BUN/Creatinine Ratio: 9 — ABNORMAL LOW (ref 10–24)
BUN: 13 mg/dL (ref 8–27)
CO2: 16 mmol/L — ABNORMAL LOW (ref 20–29)
Calcium: 9.7 mg/dL (ref 8.6–10.2)
Chloride: 101 mmol/L (ref 96–106)
Creatinine, Ser: 1.48 mg/dL — ABNORMAL HIGH (ref 0.76–1.27)
GFR calc Af Amer: 50 mL/min/{1.73_m2} — ABNORMAL LOW (ref >59)
GFR calc non Af Amer: 43 mL/min/{1.73_m2} — ABNORMAL LOW (ref >59)
Glucose: 136 mg/dL — ABNORMAL HIGH (ref 65–99)
Potassium: 4.8 mmol/L (ref 3.5–5.2)
Sodium: 139 mmol/L (ref 134–144)

## 2020-09-19 LAB — LIPID PANEL W/O CHOL/HDL RATIO
Cholesterol, Total: 222 mg/dL — ABNORMAL HIGH (ref 100–199)
HDL: 41 mg/dL (ref >39)
LDL Chol Calc (NIH): 129 mg/dL — ABNORMAL HIGH (ref 0–99)
Triglycerides: 293 mg/dL — ABNORMAL HIGH (ref 0–149)
VLDL Cholesterol Cal: 52 mg/dL — ABNORMAL HIGH (ref 5–40)

## 2020-09-19 LAB — TSH: TSH: 4.22 u[IU]/mL (ref 0.450–4.500)

## 2020-09-19 NOTE — Progress Notes (Signed)
Good morning, please let Mr. Jorge know his labs have returned.  Kidney function continues to show some stable kidney disease with no decline.  We will continue to monitor and continue Lisinopril for kidney protection.  Sugar was a little elevated, I am adding on an A1C to your labs from yesterday to assess for any diabetes.  I will let you know when this result returns.  Thyroid lab is normal this check.  Cholesterol levels are elevated, continue focus on diet and regular activity.  Any questions? Keep being awesome!!  Thank you for allowing me to participate in your care. Kindest regards, Phoua Hoadley

## 2020-09-19 NOTE — Addendum Note (Signed)
Addended by: Marnee Guarneri T on: 09/19/2020 08:16 AM   Modules accepted: Orders

## 2020-09-20 LAB — T4, FREE: Free T4: 0.91 ng/dL (ref 0.82–1.77)

## 2020-09-20 LAB — SPECIMEN STATUS REPORT

## 2020-09-25 ENCOUNTER — Ambulatory Visit (INDEPENDENT_AMBULATORY_CARE_PROVIDER_SITE_OTHER): Payer: Medicare Other | Admitting: Nurse Practitioner

## 2020-09-25 ENCOUNTER — Encounter: Payer: Self-pay | Admitting: Nurse Practitioner

## 2020-09-25 ENCOUNTER — Other Ambulatory Visit: Payer: Self-pay

## 2020-09-25 DIAGNOSIS — H6123 Impacted cerumen, bilateral: Secondary | ICD-10-CM

## 2020-09-25 DIAGNOSIS — H6120 Impacted cerumen, unspecified ear: Secondary | ICD-10-CM | POA: Insufficient documentation

## 2020-09-25 NOTE — Progress Notes (Signed)
BP 96/61   Pulse 60   Temp (!) 97.5 F (36.4 C) (Oral)   Ht 5\' 6"  (1.676 m)   Wt 160 lb 6.4 oz (72.8 kg)   SpO2 99%   BMI 25.89 kg/m    Subjective:    Patient ID: Vincent Koch, male    DOB: December 11, 1933, 86 y.o.   MRN: [id number]  HPI: TOR TSUDA is a 86 y.o. male  Chief Complaint  Patient presents with  . Otalgia    Right Ear x 1 week, not really painful just cant hear. Patient has been using debrox in right ear. for about a week   EAR CERUMEN Reports impaction to right ear x one week with no pain.  Can not hear well out of it. Has been using Debrox at home. Duration: days Involved ear(s): right Fever: no Otorrhea: no Upper respiratory infection symptoms: no Pruritus: no Hearing loss: yes Water immersion no Using Q-tips: yes Recurrent otitis media: no Status: stable Treatments attempted: Debrox  Relevant past medical, surgical, family and social history reviewed and updated as indicated. Interim medical history since our last visit reviewed. Allergies and medications reviewed and updated.  Review of Systems  Constitutional: Negative for activity change, diaphoresis, fatigue and fever.  HENT: Positive for hearing loss. Negative for ear discharge and ear pain.   Respiratory: Negative for cough, chest tightness, shortness of breath and wheezing.   Cardiovascular: Negative for chest pain, palpitations and leg swelling.  Gastrointestinal: Negative.   Endocrine: Negative for cold intolerance and heat intolerance.  Neurological: Negative.   Psychiatric/Behavioral: Negative.     Per HPI unless specifically indicated above     Objective:    BP 96/61   Pulse 60   Temp (!) 97.5 F (36.4 C) (Oral)   Ht 5\' 6"  (1.676 m)   Wt 160 lb 6.4 oz (72.8 kg)   SpO2 99%   BMI 25.89 kg/m   Wt Readings from Last 3 Encounters:  09/25/20 160 lb 6.4 oz (72.8 kg)  09/18/20 159 lb (72.1 kg)  08/20/19 161 lb 3.2 oz (73.1 kg)    Physical Exam Vitals and nursing  note reviewed.  Constitutional:      General: He is awake. He is not in acute distress.    Appearance: He is well-developed and well-groomed. He is not ill-appearing.  HENT:     Head: Normocephalic and atraumatic.     Right Ear: Decreased hearing noted. No drainage. There is impacted cerumen.     Left Ear: Hearing normal. No drainage. There is impacted cerumen.     Ears:     Comments: Impacted cerumen, soft, to bilateral ears.  Unable to view TM.  Bilateral ears irrigated with luke warm tap water.  Tolerated well without complaint.  Small amount cerumen removed from bilateral ears.  Able to view TM and bony landmarks without difficulty post procedure and patient hearing improved. Eyes:     General: Lids are normal.        Right eye: No discharge.        Left eye: No discharge.     Conjunctiva/sclera: Conjunctivae normal.     Pupils: Pupils are equal, round, and reactive to light.  Neck:     Thyroid: No thyromegaly.     Vascular: No carotid bruit.     Trachea: Trachea normal.  Cardiovascular:     Rate and Rhythm: Normal rate and regular rhythm.     Heart sounds: Normal heart sounds, S1 normal  and S2 normal. No murmur heard.  No gallop.   Pulmonary:     Effort: Pulmonary effort is normal. No accessory muscle usage or respiratory distress.     Breath sounds: Normal breath sounds.  Abdominal:     General: Bowel sounds are normal.     Palpations: Abdomen is soft.  Musculoskeletal:        General: Normal range of motion.     Cervical back: Normal range of motion and neck supple.     Right lower leg: No edema.     Left lower leg: No edema.  Skin:    General: Skin is warm and dry.     Capillary Refill: Capillary refill takes less than 2 seconds.     Findings: No rash.  Neurological:     Mental Status: He is alert and oriented to person, place, and time.     Deep Tendon Reflexes: Reflexes are normal and symmetric.  Psychiatric:        Attention and Perception: Attention normal.          Mood and Affect: Mood normal.        Speech: Speech normal.        Behavior: Behavior normal. Behavior is cooperative.        Thought Content: Thought content normal.    Results for orders placed or performed in visit on 66/29/47  Basic metabolic panel  Result Value Ref Range   Glucose 136 (H) 65 - 99 mg/dL   BUN 13 8 - 27 mg/dL   Creatinine, Ser 1.48 (H) 0.76 - 1.27 mg/dL   GFR calc non Af Amer 43 (L) >59 mL/min/1.73   GFR calc Af Amer 50 (L) >59 mL/min/1.73   BUN/Creatinine Ratio 9 (L) 10 - 24   Sodium 139 134 - 144 mmol/L   Potassium 4.8 3.5 - 5.2 mmol/L   Chloride 101 96 - 106 mmol/L   CO2 16 (L) 20 - 29 mmol/L   Calcium 9.7 8.6 - 10.2 mg/dL  Lipid Panel w/o Chol/HDL Ratio  Result Value Ref Range   Cholesterol, Total 222 (H) 100 - 199 mg/dL   Triglycerides 293 (H) 0 - 149 mg/dL   HDL 41 >39 mg/dL   VLDL Cholesterol Cal 52 (H) 5 - 40 mg/dL   LDL Chol Calc (NIH) 129 (H) 0 - 99 mg/dL  TSH  Result Value Ref Range   TSH 4.220 0.450 - 4.500 uIU/mL  T4, free  Result Value Ref Range   Free T4 0.91 0.82 - 1.77 ng/dL  Specimen status report  Result Value Ref Range   specimen status report Comment       Assessment & Plan:   Problem List Items Addressed This Visit      Nervous and Auditory   Cerumen impaction    Acute.   Unable to view TM.  Bilateral ears irrigated with luke warm tap water.  Tolerated well without complaint.  Small amount cerumen removed from bilateral ears.  Able to view TM and bony landmarks without difficulty post procedure and patient hearing improved.  Recommend return to office for any worsening symptoms or return of symptoms.          Follow up plan: Return if symptoms worsen or fail to improve.

## 2020-09-25 NOTE — Patient Instructions (Signed)
Earwax Buildup, Adult The ears produce a substance called earwax that helps keep bacteria out of the ear and protects the skin in the ear canal. Occasionally, earwax can build up in the ear and cause discomfort or hearing loss. What increases the risk? This condition is more likely to develop in people who:  Are male.  Are elderly.  Naturally produce more earwax.  Clean their ears often with cotton swabs.  Use earplugs often.  Use in-ear headphones often.  Wear hearing aids.  Have narrow ear canals.  Have earwax that is overly thick or sticky.  Have eczema.  Are dehydrated.  Have excess hair in the ear canal. What are the signs or symptoms? Symptoms of this condition include:  Reduced or muffled hearing.  A feeling of fullness in the ear or feeling that the ear is plugged.  Fluid coming from the ear.  Ear pain.  Ear itch.  Ringing in the ear.  Coughing.  An obvious piece of earwax that can be seen inside the ear canal. How is this diagnosed? This condition may be diagnosed based on:  Your symptoms.  Your medical history.  An ear exam. During the exam, your health care provider will look into your ear with an instrument called an otoscope. You may have tests, including a hearing test. How is this treated? This condition may be treated by:  Using ear drops to soften the earwax.  Having the earwax removed by a health care provider. The health care provider may: ? Flush the ear with water. ? Use an instrument that has a loop on the end (curette). ? Use a suction device.  Surgery to remove the wax buildup. This may be done in severe cases. Follow these instructions at home:   Take over-the-counter and prescription medicines only as told by your health care provider.  Do not put any objects, including cotton swabs, into your ear. You can clean the opening of your ear canal with a washcloth or facial tissue.  Follow instructions from your health care  provider about cleaning your ears. Do not over-clean your ears.  Drink enough fluid to keep your urine clear or pale yellow. This will help to thin the earwax.  Keep all follow-up visits as told by your health care provider. If earwax builds up in your ears often or if you use hearing aids, consider seeing your health care provider for routine, preventive ear cleanings. Ask your health care provider how often you should schedule your cleanings.  If you have hearing aids, clean them according to instructions from the manufacturer and your health care provider. Contact a health care provider if:  You have ear pain.  You develop a fever.  You have blood, pus, or other fluid coming from your ear.  You have hearing loss.  You have ringing in your ears that does not go away.  Your symptoms do not improve with treatment.  You feel like the room is spinning (vertigo). Summary  Earwax can build up in the ear and cause discomfort or hearing loss.  The most common symptoms of this condition include reduced or muffled hearing and a feeling of fullness in the ear or feeling that the ear is plugged.  This condition may be diagnosed based on your symptoms, your medical history, and an ear exam.  This condition may be treated by using ear drops to soften the earwax or by having the earwax removed by a health care provider.  Do not put any   objects, including cotton swabs, into your ear. You can clean the opening of your ear canal with a washcloth or facial tissue. This information is not intended to replace advice given to you by your health care provider. Make sure you discuss any questions you have with your health care provider. Document Revised: 10/21/2017 Document Reviewed: 01/19/2017 Elsevier Patient Education  2020 [employer]  

## 2020-09-25 NOTE — Assessment & Plan Note (Signed)
Acute.   Unable to view TM.  Bilateral ears irrigated with luke warm tap water.  Tolerated well without complaint.  Small amount cerumen removed from bilateral ears.  Able to view TM and bony landmarks without difficulty post procedure and patient hearing improved.  Recommend return to office for any worsening symptoms or return of symptoms.

## 2020-10-29 ENCOUNTER — Ambulatory Visit (INDEPENDENT_AMBULATORY_CARE_PROVIDER_SITE_OTHER): Payer: Medicare Other

## 2020-10-29 ENCOUNTER — Other Ambulatory Visit: Payer: Self-pay

## 2020-10-29 DIAGNOSIS — Z23 Encounter for immunization: Secondary | ICD-10-CM

## 2021-03-02 ENCOUNTER — Ambulatory Visit (INDEPENDENT_AMBULATORY_CARE_PROVIDER_SITE_OTHER): Payer: Medicare Other

## 2021-03-02 VITALS — Ht 70.0 in | Wt 161.0 lb

## 2021-03-02 DIAGNOSIS — Z0000 Encounter for general adult medical examination without abnormal findings: Secondary | ICD-10-CM

## 2021-03-02 NOTE — Patient Instructions (Signed)
[name] , Thank you for taking time to come for your Medicare Wellness Visit. I appreciate your ongoing commitment to your health goals. Please review the following plan we discussed and let me know if I can assist you in the future.   Screening recommendations/referrals: Colonoscopy: completed 12/11/2018, due 12/12/2023 Recommended yearly ophthalmology/optometry visit for glaucoma screening and checkup Recommended yearly dental visit for hygiene and checkup  Vaccinations: Influenza vaccine: completed 128/2021, due 06/22/2021 Pneumococcal vaccine: completed 10/10/2014 Tdap vaccine: completed 06/29/2016, due 06/29/2026 Shingles vaccine: discussed   Covid-19:  decline  Advanced directives: Please bring a copy of your POA (Power of Attorney) and/or Living Will to your next appointment.   Conditions/risks identified: none  Next appointment: Follow up in one year for your annual wellness visit.   Preventive Care 31 Years and Older, Male Preventive care refers to lifestyle choices and visits with your health care provider that can promote health and wellness. What does preventive care include?  A yearly physical exam. This is also called an annual well check.  Dental exams once or twice a year.  Routine eye exams. Ask your health care provider how often you should have your eyes checked.  Personal lifestyle choices, including:  Daily care of your teeth and gums.  Regular physical activity.  Eating a healthy diet.  Avoiding tobacco and drug use.  Limiting alcohol use.  Practicing safe sex.  Taking low doses of aspirin every day.  Taking vitamin and mineral supplements as recommended by your health care provider. What happens during an annual well check? The services and screenings done by your health care provider during your annual well check will depend on your age, overall health, lifestyle risk factors, and family history of disease. Counseling  Your health care  provider may ask you questions about your:  Alcohol use.  Tobacco use.  Drug use.  Emotional well-being.  Home and relationship well-being.  Sexual activity.  Eating habits.  History of falls.  Memory and ability to understand (cognition).  Work and work Statistician. Screening  You may have the following tests or measurements:  Height, weight, and BMI.  Blood pressure.  Lipid and cholesterol levels. These may be checked every 5 years, or more frequently if you are over 50 years old.  Skin check.  Lung cancer screening. You may have this screening every year starting at age 31 if you have a 30-pack-year history of smoking and currently smoke or have quit within the past 15 years.  Fecal occult blood test (FOBT) of the stool. You may have this test every year starting at age 34.  Flexible sigmoidoscopy or colonoscopy. You may have a sigmoidoscopy every 5 years or a colonoscopy every 10 years starting at age 88.  Prostate cancer screening. Recommendations will vary depending on your family history and other risks.  Hepatitis C blood test.  Hepatitis B blood test.  Sexually transmitted disease (STD) testing.  Diabetes screening. This is done by checking your blood sugar (glucose) after you have not eaten for a while (fasting). You may have this done every 1-3 years.  Abdominal aortic aneurysm (AAA) screening. You may need this if you are a current or former smoker.  Osteoporosis. You may be screened starting at age 45 if you are at high risk. Talk with your health care provider about your test results, treatment options, and if necessary, the need for more tests. Vaccines  Your health care provider may recommend certain vaccines, such as:  Influenza vaccine. This  is recommended every year.  Tetanus, diphtheria, and acellular pertussis (Tdap, Td) vaccine. You may need a Td booster every 10 years.  Zoster vaccine. You may need this after age 70.  Pneumococcal  13-valent conjugate (PCV13) vaccine. One dose is recommended after age 1.  Pneumococcal polysaccharide (PPSV23) vaccine. One dose is recommended after age 37. Talk to your health care provider about which screenings and vaccines you need and how often you need them. This information is not intended to replace advice given to you by your health care provider. Make sure you discuss any questions you have with your health care provider. Document Released: 12/05/2015 Document Revised: 07/28/2016 Document Reviewed: 09/09/2015 Elsevier Interactive Patient Education  2017 [place] Prevention in the Home Falls can cause injuries. They can happen to people of all ages. There are many things you can do to make your home safe and to help prevent falls. What can I do on the outside of my home?  Regularly fix the edges of walkways and driveways and fix any cracks.  Remove anything that might make you trip as you walk through a door, such as a raised step or threshold.  Trim any bushes or trees on the path to your home.  Use bright outdoor lighting.  Clear any walking paths of anything that might make someone trip, such as rocks or tools.  Regularly check to see if handrails are loose or broken. Make sure that both sides of any steps have handrails.  Any raised decks and porches should have guardrails on the edges.  Have any leaves, snow, or ice cleared regularly.  Use sand or salt on walking paths during winter.  Clean up any spills in your garage right away. This includes oil or grease spills. What can I do in the bathroom?  Use night lights.  Install grab bars by the toilet and in the tub and shower. Do not use towel bars as grab bars.  Use non-skid mats or decals in the tub or shower.  If you need to sit down in the shower, use a plastic, non-slip stool.  Keep the floor dry. Clean up any water that spills on the floor as soon as it happens.  Remove soap buildup in the  tub or shower regularly.  Attach bath mats securely with double-sided non-slip rug tape.  Do not have throw rugs and other things on the floor that can make you trip. What can I do in the bedroom?  Use night lights.  Make sure that you have a light by your bed that is easy to reach.  Do not use any sheets or blankets that are too big for your bed. They should not hang down onto the floor.  Have a firm chair that has side arms. You can use this for support while you get dressed.  Do not have throw rugs and other things on the floor that can make you trip. What can I do in the kitchen?  Clean up any spills right away.  Avoid walking on wet floors.  Keep items that you use a lot in easy-to-reach places.  If you need to reach something above you, use a strong step stool that has a grab bar.  Keep electrical cords out of the way.  Do not use floor polish or wax that makes floors slippery. If you must use wax, use non-skid floor wax.  Do not have throw rugs and other things on the floor that can make you  trip. What can I do with my stairs?  Do not leave any items on the stairs.  Make sure that there are handrails on both sides of the stairs and use them. Fix handrails that are broken or loose. Make sure that handrails are as long as the stairways.  Check any carpeting to make sure that it is firmly attached to the stairs. Fix any carpet that is loose or worn.  Avoid having throw rugs at the top or bottom of the stairs. If you do have throw rugs, attach them to the floor with carpet tape.  Make sure that you have a light switch at the top of the stairs and the bottom of the stairs. If you do not have them, ask someone to add them for you. What else can I do to help prevent falls?  Wear shoes that:  Do not have high heels.  Have rubber bottoms.  Are comfortable and fit you well.  Are closed at the toe. Do not wear sandals.  If you use a stepladder:  Make sure that it  is fully opened. Do not climb a closed stepladder.  Make sure that both sides of the stepladder are locked into place.  Ask someone to hold it for you, if possible.  Clearly mark and make sure that you can see:  Any grab bars or handrails.  First and last steps.  Where the edge of each step is.  Use tools that help you move around (mobility aids) if they are needed. These include:  Canes.  Walkers.  Scooters.  Crutches.  Turn on the lights when you go into a dark area. Replace any light bulbs as soon as they burn out.  Set up your furniture so you have a clear path. Avoid moving your furniture around.  If any of your floors are uneven, fix them.  If there are any pets around you, be aware of where they are.  Review your medicines with your doctor. Some medicines can make you feel dizzy. This can increase your chance of falling. Ask your doctor what other things that you can do to help prevent falls. This information is not intended to replace advice given to you by your health care provider. Make sure you discuss any questions you have with your health care provider. Document Released: 09/04/2009 Document Revised: 04/15/2016 Document Reviewed: 12/13/2014 Elsevier Interactive Patient Education  2017 [employer].

## 2021-03-02 NOTE — Progress Notes (Signed)
I connected with Vincent Koch today by telephone and verified that I am speaking with the correct person using two identifiers. Location patient: home Location provider: work Persons participating in the virtual visit: [name], [name].   I discussed the limitations, risks, security and privacy concerns of performing an evaluation and management service by telephone and the availability of in person appointments. I also discussed with the patient that there may be a patient responsible charge related to this service. The patient expressed understanding and verbally consented to this telephonic visit.    Interactive audio and video telecommunications were attempted between this provider and patient, however failed, due to patient having technical difficulties OR patient did not have access to video capability.  We continued and completed visit with audio only.     Vital signs may be patient reported or missing.  Subjective:   Vincent Koch is a 87 y.o. male who presents for Medicare Annual/Subsequent preventive examination.  Review of Systems     Cardiac Risk Factors include: advanced age (>23men, >39 women);dyslipidemia;hypertension;male gender;sedentary lifestyle     Objective:    Today's Vitals   03/02/21 0857  Weight: 161 lb (73 kg)  Height: 5\' 10"  (1.778 m)   Body mass index is 23.1 kg/m.  Advanced Directives 03/02/2021 06/27/2019 06/13/2019 12/11/2018 06/07/2018 06/08/2017 07/15/2016  Does Patient Have a Medical Advance Directive? Yes Yes Yes Yes Yes No No  Type of Paramedic of [place];Living will Living will Living will;Healthcare Power of Attorney - Living will;Healthcare Power of Attorney - -  Does patient want to make changes to medical advance directive? - No - Patient declined - - - - -  Copy of [place] in Chart? No - copy requested Yes - validated most recent copy scanned in chart (See row information)  No - copy requested - No - copy requested - -  Would patient like information on creating a medical advance directive? - - - - - Yes (MAU/Ambulatory/Procedural Areas - Information given) Yes - Educational materials given    Current Medications (verified) Outpatient Encounter Medications as of 03/02/2021  Medication Sig  . acetaminophen (TYLENOL) 500 MG tablet Take 500 mg by mouth every 6 (six) hours as needed.  Marland Kitchen aspirin EC 81 MG tablet Take 81 mg by mouth once.   . calcium-vitamin D (OSCAL WITH D) 500-200 MG-UNIT tablet Take 1 tablet by mouth.  . cyanocobalamin 500 MCG tablet Take 500 mcg by mouth daily.  . fexofenadine (ALLEGRA) 180 MG tablet Take 180 mg by mouth as needed for allergies.   Marland Kitchen lisinopril (ZESTRIL) 10 MG tablet Take 1 tablet (10 mg total) by mouth daily.  Marland Kitchen loperamide (IMODIUM A-D) 2 MG tablet Take 2 mg by mouth 4 (four) times daily as needed for diarrhea or loose stools.  . Multiple Vitamin (MULTI-VITAMINS) TABS Take by mouth daily.   . pantoprazole (PROTONIX) 40 MG tablet Take 1 tablet (40 mg total) by mouth daily.   No facility-administered encounter medications on file as of 03/02/2021.    Allergies (verified) Patient has no known allergies.   History: Past Medical History:  Diagnosis Date  . Bradycardia   . Chronic kidney disease   . GERD (gastroesophageal reflux disease)   . Hypertension   . Neuropathy    toes  . Osteoporosis   . Peripheral neuropathy   . Thyroid disease    Past Surgical History:  Procedure Laterality Date  . CATARACT EXTRACTION W/PHACO Right 06/27/2019  Procedure: CATARACT EXTRACTION PHACO AND INTRAOCULAR LENS PLACEMENT ([place])  RIGHT;  Surgeon: Leandrew Koyanagi, MD;  Location: [facility];  Service: Ophthalmology;  Laterality: Right;  VISION BLUE  . COLONOSCOPY WITH PROPOFOL N/A 12/11/2018   Procedure: COLONOSCOPY WITH PROPOFOL;  Surgeon: Virgel Manifold, MD;  Location: [facility];  Service: Endoscopy;  Laterality: N/A;   . ESOPHAGEAL DILATION N/A 06/14/2016   Procedure: ESOPHAGOGASTRODUODENOSCOPY (EGD) WITH PROPOFOL with dilation;  Surgeon: Lucilla Lame, MD;  Location: [facility];  Service: Endoscopy;  Laterality: N/A;  . ESOPHAGOGASTRODUODENOSCOPY (EGD) WITH PROPOFOL N/A 07/15/2016   Procedure: ESOPHAGOGASTRODUODENOSCOPY (EGD) WITH DILATION;  Surgeon: Lucilla Lame, MD;  Location: [facility];  Service: Endoscopy;  Laterality: N/A;  . ESOPHAGOGASTRODUODENOSCOPY (EGD) WITH PROPOFOL N/A 12/11/2018   Procedure: ESOPHAGOGASTRODUODENOSCOPY (EGD) WITH PROPOFOL;  Surgeon: Virgel Manifold, MD;  Location: [facility];  Service: Endoscopy;  Laterality: N/A;  . HERNIA REPAIR    . PARATHYROIDECTOMY     Family History  Problem Relation Age of Onset  . Stroke Mother   . Throat cancer Father    Social History   Socioeconomic History  . Marital status: Married    Spouse name: Not on file  . Number of children: Not on file  . Years of education: Not on file  . Highest education level: Master's degree (e.g., MA, MS, MEng, MEd, MSW, MBA)  Occupational History  . Occupation: retired  Tobacco Use  . Smoking status: Never Smoker  . Smokeless tobacco: Never Used  Vaping Use  . Vaping Use: Never used  Substance and Sexual Activity  . Alcohol use: Never  . Drug use: Never  . Sexual activity: Not on file  Other Topics Concern  . Not on file  Social History Narrative  . Not on file   Social Determinants of Health   Financial Resource Strain: Low Risk   . Difficulty of Paying Living Expenses: Not hard at all  Food Insecurity: No Food Insecurity  . Worried About Charity fundraiser in the Last Year: Never true  . Ran Out of Food in the Last Year: Never true  Transportation Needs: No Transportation Needs  . Lack of Transportation (Medical): No  . Lack of Transportation (Non-Medical): No  Physical Activity: Inactive  . Days of Exercise per Week: 0 days  . Minutes of Exercise per Session: 0  min  Stress: No Stress Concern Present  . Feeling of Stress : Not at all  Social Connections: Not on file    Tobacco Counseling Counseling given: Not Answered   Clinical Intake:  Pre-visit preparation completed: Yes  Pain : No/denies pain     Nutritional Status: BMI of 19-24  Normal Nutritional Risks: Nausea/ vomitting/ diarrhea Diabetes: No  How often do you need to have someone help you when you read instructions, pamphlets, or other written materials from your doctor or pharmacy?: 1 - Never What is the last grade level you completed in school?: graduate degree  Diabetic? no  Interpreter Needed?: No  Information entered by :: NAllen LPN   Activities of Daily Living In your present state of health, do you have any difficulty performing the following activities: 03/02/2021 09/18/2020  Hearing? N N  Vision? N N  Difficulty concentrating or making decisions? N N  Walking or climbing stairs? N N  Dressing or bathing? N N  Doing errands, shopping? N N  Preparing Food and eating ? N -  Using the Toilet? N -  In the past six  months, have you accidently leaked urine? N -  Do you have problems with loss of bowel control? Y -  Comment been having diarrhea episoodes -  Managing your Medications? Y -  Comment wife manages -  Managing your Finances? N -  Housekeeping or managing your Housekeeping? N -  Some recent data might be hidden    Patient Care Team: Charlynne Cousins, MD as PCP - General Lucilla Lame, MD as Consulting Physician (Gastroenterology)  Indicate any recent Medical Services you may have received from other than [facility] providers in the past year (date may be approximate).     Assessment:   This is a routine wellness examination for Akash.  Hearing/Vision screen  Hearing Screening   125Hz  250Hz  500Hz  1000Hz  2000Hz  3000Hz  4000Hz  6000Hz  8000Hz   Right ear:           Left ear:           Vision Screening Comments: Regular eye exams, Dr. Wallace Going  Dietary  issues and exercise activities discussed: Current Exercise Habits: The patient does not participate in regular exercise at present  Goals    . DIET - INCREASE WATER INTAKE     Recommend drinking at least 6-8 glasses of water a day     . Increase water intake     Recommend to continue drinking at least 5-6 glasses of water a day    . Patient Stated     03/02/2021, stay healthy      Depression Screen PHQ 2/9 Scores 03/02/2021 09/18/2020 08/20/2019 06/13/2019 06/07/2018 12/28/2017 06/22/2017  PHQ - 2 Score 0 0 0 0 0 0 0  PHQ- 9 Score - - 0 - - - -    Fall Risk Fall Risk  03/02/2021 09/18/2020 08/20/2019 06/13/2019 06/29/2018  Falls in the past year? 0 0 0 0 No  Number falls in past yr: - 0 0 - -  Injury with Fall? - 0 0 - -  Risk for fall due to : Medication side effect No Fall Risks - - -  Follow up Falls evaluation completed;Education provided;Falls prevention discussed Falls evaluation completed Falls evaluation completed - -    FALL RISK PREVENTION PERTAINING TO THE HOME:  Any stairs in or around the home? Yes  If so, are there any without handrails? No  Home free of loose throw rugs in walkways, pet beds, electrical cords, etc? Yes  Adequate lighting in your home to reduce risk of falls? Yes   ASSISTIVE DEVICES UTILIZED TO PREVENT FALLS:  Life alert? No  Use of a cane, walker or w/c? No  Grab bars in the bathroom? No  Shower chair or bench in shower? Yes  Elevated toilet seat or a handicapped toilet? Yes   TIMED UP AND GO:  Was the test performed? No .  Cognitive Function:     6CIT Screen 03/02/2021 08/20/2019 06/07/2018 06/08/2017  What Year? 0 points 0 points 0 points 0 points  What month? 0 points 0 points 0 points 0 points  What time? 0 points 0 points 0 points 0 points  Count back from 20 0 points 0 points 0 points 0 points  Months in reverse 0 points 0 points 0 points 0 points  Repeat phrase 4 points 0 points 2 points 2 points  Total Score 4 0 2 2     Immunizations Immunization History  Administered Date(s) Administered  . Fluad Quad(high Dose 65+) 08/20/2019, 10/29/2020  . Influenza, High Dose Seasonal PF 09/12/2017, 08/30/2018  . Influenza,inj,Quad  PF,6+ Mos 09/22/2015  . Pneumococcal Conjugate-13 10/10/2014  . Pneumococcal-Unspecified 11/02/2004  . Tdap 06/29/2016  . Zoster 01/30/2007    TDAP status: Up to date  Flu Vaccine status: Up to date  Pneumococcal vaccine status: Up to date  Covid-19 vaccine status: Declined, Education has been provided regarding the importance of this vaccine but patient still declined. Advised may receive this vaccine at local pharmacy or Health Dept.or vaccine clinic. Aware to provide a copy of the vaccination record if obtained from local pharmacy or Health Dept. Verbalized acceptance and understanding.  Qualifies for Shingles Vaccine? Yes   Zostavax completed Yes   Shingrix Completed?: No.    Education has been provided regarding the importance of this vaccine. Patient has been advised to call insurance company to determine out of pocket expense if they have not yet received this vaccine. Advised may also receive vaccine at local pharmacy or Health Dept. Verbalized acceptance and understanding.  Screening Tests Health Maintenance  Topic Date Due  . COVID-19 Vaccine (1) Never done  . INFLUENZA VACCINE  06/22/2021  . COLONOSCOPY (Pts 45-53yrs Insurance coverage will need to be confirmed)  12/12/2023  . TETANUS/TDAP  06/29/2026  . PNA vac Low Risk Adult  Completed  . HPV VACCINES  Aged Out    Health Maintenance  Health Maintenance Due  Topic Date Due  . COVID-19 Vaccine (1) Never done    Colorectal cancer screening: Type of screening: Colonoscopy. Completed 12/11/2018. Repeat every 5 years  Lung Cancer Screening: (Low Dose CT Chest recommended if Age 63-80 years, 30 pack-year currently smoking OR have quit w/in 15years.) does not qualify.   Lung Cancer Screening Referral:  no  Additional Screening:  Hepatitis C Screening: does not qualify;   Vision Screening: Recommended annual ophthalmology exams for early detection of glaucoma and other disorders of the eye. Is the patient up to date with their annual eye exam?  Yes  Who is the provider or what is the name of the office in which the patient attends annual eye exams? Dr. Wallace Going If pt is not established with a provider, would they like to be referred to a provider to establish care? No .   Dental Screening: Recommended annual dental exams for proper oral hygiene  Community Resource Referral / Chronic Care Management: CRR required this visit?  No   CCM required this visit?  No      Plan:     I have personally reviewed and noted the following in the patient's chart:   . Medical and social history . Use of alcohol, tobacco or illicit drugs  . Current medications and supplements . Functional ability and status . Nutritional status . Physical activity . Advanced directives . List of other physicians . Hospitalizations, surgeries, and ER visits in previous 12 months . Vitals . Screenings to include cognitive, depression, and falls . Referrals and appointments  In addition, I have reviewed and discussed with patient certain preventive protocols, quality metrics, and best practice recommendations. A written personalized care plan for preventive services as well as general preventive health recommendations were provided to patient.     Kellie Simmering, LPN   [id number]   Nurse Notes:

## 2021-03-04 ENCOUNTER — Ambulatory Visit: Payer: Medicare Other | Admitting: Nurse Practitioner

## 2021-03-05 DIAGNOSIS — Z961 Presence of intraocular lens: Secondary | ICD-10-CM | POA: Diagnosis not present

## 2021-03-16 ENCOUNTER — Ambulatory Visit (INDEPENDENT_AMBULATORY_CARE_PROVIDER_SITE_OTHER): Payer: Medicare Other | Admitting: Nurse Practitioner

## 2021-03-16 ENCOUNTER — Other Ambulatory Visit: Payer: Self-pay

## 2021-03-16 ENCOUNTER — Encounter: Payer: Self-pay | Admitting: Nurse Practitioner

## 2021-03-16 ENCOUNTER — Encounter: Payer: Self-pay | Admitting: *Deleted

## 2021-03-16 VITALS — BP 98/59 | HR 58 | Temp 97.9°F | Wt 163.8 lb

## 2021-03-16 DIAGNOSIS — E3400 Carcinoid syndrome, unspecified: Secondary | ICD-10-CM | POA: Insufficient documentation

## 2021-03-16 DIAGNOSIS — E039 Hypothyroidism, unspecified: Secondary | ICD-10-CM | POA: Diagnosis not present

## 2021-03-16 NOTE — Patient Instructions (Signed)

## 2021-03-16 NOTE — Progress Notes (Signed)
BP (!) 98/59   Pulse (!) 58 Comment: apical  Temp 97.9 F (36.6 C) (Oral)   Wt 163 lb 12.8 oz (74.3 kg)   SpO2 96%   BMI 23.50 kg/m    Subjective:    Patient ID: Vincent Koch, male    DOB: 10-02-1934, 87 y.o.   MRN: [id number]  HPI: Vincent Koch is a 87 y.o. male  Chief Complaint  Patient presents with  . GI Problem    Patient states he has been having stomach/bowels issues and has had testing done and is here to follow up and discuss next recommended steps.   ABDOMINAL ISSUES Continues to have these episodes of flushing and diarrhea for two years. Most severe episode was recently 02/19/21.  Episodes drain him. On the recent March episode he had flushing, took all clothes off, had loose bowel and severe pain.  No blood in the stool noticed.  Has not noticed any trigger items in diet. Does have episodes of constipation and using Metamucil occasionally + for diarrhea uses Imodium.  When has the diarrhea episodes pain gets to 10/10, diffuse in abdomen.  Has these flares sometimes once a month, sometimes more or less -- difficult to predict.    As history of abdominal CT back on 01/05/2019 noting moderate to large gastric hiatal hernia + lingula 12-14 mm lung nodule with recommendation for repeat in 3 months chest CT -- which he did not want to pursue.  Was recommended he have an octreoscan -- he did not pursue.  Was felt to have carcinoid syndrome.  Went through EGD and colonoscopy 12/19/2018,  polyps showed foveolar hyperplasia and gastric hyperplastic polyp.  Pathology showed dilated lactulose consistent with lymphangiectasia in the small bowel on review. Duration:months Onset: gradual Severity: moderate Quality: dull, aching and throbbing Location:  diffuse Episode duration: varies Radiation: no Frequency: intermittent Alleviating factors:  Aggravating factors: Status: fluctuating Treatments attempted: Imodium Fever: no Nausea: yes Vomiting: no Weight loss:  no Decreased appetite: no -- after episodes this presents, takes a day or two to recover Diarrhea: yes Constipation: yes Blood in stool: no Heartburn: no Jaundice: no Rash: no Dysuria/urinary frequency: no Hematuria: no History of sexually transmitted disease: no Recurrent NSAID use: no  Relevant past medical, surgical, family and social history reviewed and updated as indicated. Interim medical history since our last visit reviewed. Allergies and medications reviewed and updated.  Review of Systems  Constitutional: Negative.   Respiratory: Negative.   Cardiovascular: Negative.   Gastrointestinal: Positive for abdominal pain, constipation, diarrhea and nausea. Negative for abdominal distention, blood in stool, rectal pain and vomiting.  Neurological: Negative.   Psychiatric/Behavioral: Negative.     Per HPI unless specifically indicated above     Objective:    BP (!) 98/59   Pulse (!) 58 Comment: apical  Temp 97.9 F (36.6 C) (Oral)   Wt 163 lb 12.8 oz (74.3 kg)   SpO2 96%   BMI 23.50 kg/m   Wt Readings from Last 3 Encounters:  03/16/21 163 lb 12.8 oz (74.3 kg)  03/02/21 161 lb (73 kg)  09/25/20 160 lb 6.4 oz (72.8 kg)    Physical Exam Vitals and nursing note reviewed.  Constitutional:      General: He is awake. He is not in acute distress.    Appearance: He is well-developed and well-groomed. He is not ill-appearing.  HENT:     Head: Normocephalic and atraumatic.     Right Ear: Hearing normal. No drainage.  Left Ear: Hearing normal. No drainage.  Eyes:     General: Lids are normal.        Right eye: No discharge.        Left eye: No discharge.     Conjunctiva/sclera: Conjunctivae normal.     Pupils: Pupils are equal, round, and reactive to light.  Neck:     Thyroid: No thyromegaly.     Vascular: No carotid bruit.     Trachea: Trachea normal.  Cardiovascular:     Rate and Rhythm: Normal rate and regular rhythm.     Heart sounds: Normal heart sounds,  S1 normal and S2 normal. No murmur heard. No gallop.   Pulmonary:     Effort: Pulmonary effort is normal. No accessory muscle usage or respiratory distress.     Breath sounds: Normal breath sounds.  Abdominal:     General: Bowel sounds are normal. There is no distension or abdominal bruit.     Palpations: Abdomen is soft. There is no hepatomegaly.     Tenderness: There is no abdominal tenderness.  Musculoskeletal:        General: Normal range of motion.     Cervical back: Normal range of motion and neck supple.     Right lower leg: No edema.     Left lower leg: No edema.  Skin:    General: Skin is warm and dry.     Capillary Refill: Capillary refill takes less than 2 seconds.     Findings: No rash.  Neurological:     Mental Status: He is alert and oriented to person, place, and time.     Deep Tendon Reflexes: Reflexes are normal and symmetric.  Psychiatric:        Attention and Perception: Attention normal.        Mood and Affect: Mood normal.        Speech: Speech normal.        Behavior: Behavior normal. Behavior is cooperative.        Thought Content: Thought content normal.     Results for orders placed or performed in visit on 16/10/96  Basic metabolic panel  Result Value Ref Range   Glucose 136 (H) 65 - 99 mg/dL   BUN 13 8 - 27 mg/dL   Creatinine, Ser 1.48 (H) 0.76 - 1.27 mg/dL   GFR calc non Af Amer 43 (L) >59 mL/min/1.73   GFR calc Af Amer 50 (L) >59 mL/min/1.73   BUN/Creatinine Ratio 9 (L) 10 - 24   Sodium 139 134 - 144 mmol/L   Potassium 4.8 3.5 - 5.2 mmol/L   Chloride 101 96 - 106 mmol/L   CO2 16 (L) 20 - 29 mmol/L   Calcium 9.7 8.6 - 10.2 mg/dL  Lipid Panel w/o Chol/HDL Ratio  Result Value Ref Range   Cholesterol, Total 222 (H) 100 - 199 mg/dL   Triglycerides 293 (H) 0 - 149 mg/dL   HDL 41 >39 mg/dL   VLDL Cholesterol Cal 52 (H) 5 - 40 mg/dL   LDL Chol Calc (NIH) 129 (H) 0 - 99 mg/dL  TSH  Result Value Ref Range   TSH 4.220 0.450 - 4.500 uIU/mL  T4,  free  Result Value Ref Range   Free T4 0.91 0.82 - 1.77 ng/dL  Specimen status report  Result Value Ref Range   specimen status report Comment       Assessment & Plan:   Problem List Items Addressed This Visit  Other   Carcinoid syndrome ([place]) - Primary    Initially seen by GI in 2020 for this, had recent severe flare.  Would like to return to GI for further testing, referral placed.  Labs today to include CBC, CMP, TSH.  No weight loss or decreased appetite, no night sweats or fever.  Plan to follow-up in 4 weeks, sooner if return or worsening flares.      Relevant Orders   CBC with Differential/Platelet   Comprehensive metabolic panel   TSH   T4, free   Ambulatory referral to Gastroenterology    Other Visit Diagnoses    Hypothyroidism, unspecified type       History of elevation in TSH, recheck today with Free T4.   Relevant Orders   TSH   T4, free       Follow up plan: Return in about 4 weeks (around 04/13/2021) for Abdominal pain.

## 2021-03-16 NOTE — Assessment & Plan Note (Signed)
Initially seen by GI in 2020 for this, had recent severe flare.  Would like to return to GI for further testing, referral placed.  Labs today to include CBC, CMP, TSH.  No weight loss or decreased appetite, no night sweats or fever.  Plan to follow-up in 4 weeks, sooner if return or worsening flares.

## 2021-03-17 ENCOUNTER — Telehealth: Payer: Self-pay | Admitting: Internal Medicine

## 2021-03-17 ENCOUNTER — Other Ambulatory Visit: Payer: Self-pay | Admitting: Nurse Practitioner

## 2021-03-17 DIAGNOSIS — E038 Other specified hypothyroidism: Secondary | ICD-10-CM

## 2021-03-17 LAB — COMPREHENSIVE METABOLIC PANEL
ALT: 33 IU/L (ref 0–44)
AST: 33 IU/L (ref 0–40)
Albumin/Globulin Ratio: 1.7 (ref 1.2–2.2)
Albumin: 4.4 g/dL (ref 3.6–4.6)
Alkaline Phosphatase: 75 IU/L (ref 44–121)
BUN/Creatinine Ratio: 11 (ref 10–24)
BUN: 17 mg/dL (ref 8–27)
Bilirubin Total: 0.4 mg/dL (ref 0.0–1.2)
CO2: 20 mmol/L (ref 20–29)
Calcium: 8.9 mg/dL (ref 8.6–10.2)
Chloride: 101 mmol/L (ref 96–106)
Creatinine, Ser: 1.53 mg/dL — ABNORMAL HIGH (ref 0.76–1.27)
Globulin, Total: 2.6 g/dL (ref 1.5–4.5)
Glucose: 83 mg/dL (ref 65–99)
Potassium: 4.4 mmol/L (ref 3.5–5.2)
Sodium: 138 mmol/L (ref 134–144)
Total Protein: 7 g/dL (ref 6.0–8.5)
eGFR: 45 mL/min/{1.73_m2} — ABNORMAL LOW (ref >59)

## 2021-03-17 LAB — CBC WITH DIFFERENTIAL/PLATELET
Basophils Absolute: 0.1 10*3/uL (ref 0.0–0.2)
Basos: 2 %
EOS (ABSOLUTE): 0.3 10*3/uL (ref 0.0–0.4)
Eos: 5 %
Hematocrit: 41.3 % (ref 37.5–51.0)
Hemoglobin: 13.9 g/dL (ref 13.0–17.7)
Immature Grans (Abs): 0 10*3/uL (ref 0.0–0.1)
Immature Granulocytes: 0 %
Lymphocytes Absolute: 1.7 10*3/uL (ref 0.7–3.1)
Lymphs: 25 %
MCH: 31 pg (ref 26.6–33.0)
MCHC: 33.7 g/dL (ref 31.5–35.7)
MCV: 92 fL (ref 79–97)
Monocytes Absolute: 0.7 10*3/uL (ref 0.1–0.9)
Monocytes: 11 %
Neutrophils Absolute: 3.8 10*3/uL (ref 1.4–7.0)
Neutrophils: 57 %
Platelets: 230 10*3/uL (ref 150–450)
RBC: 4.48 x10E6/uL (ref 4.14–5.80)
RDW: 13.2 % (ref 11.6–15.4)
WBC: 6.6 10*3/uL (ref 3.4–10.8)

## 2021-03-17 LAB — T4, FREE: Free T4: 0.8 ng/dL — ABNORMAL LOW (ref 0.82–1.77)

## 2021-03-17 LAB — TSH: TSH: 4.74 u[IU]/mL — ABNORMAL HIGH (ref 0.450–4.500)

## 2021-03-17 NOTE — Telephone Encounter (Signed)
Copied from [place] [id number]. Topic: Quick Communication - Lab Results (Clinic Use ONLY) >> Mar 17, 2021  1:23 PM Irena Reichmann, CMA wrote: Called patient to inform them of lab results. When patient returns call, triage nurse may disclose results. Routed to nurse triage pool.  Patient would like a call back with lab results please Ph# [phone]

## 2021-03-17 NOTE — Progress Notes (Signed)
Please let Gibbs know his labs have returned and CBC shows no infection or anemia.  Kidney function continues to show some mild kidney disease which we will continue to monitor closely.  Thyroid labs are showing mild elevated in TSH and mildly low Free T4, I suspect you may have some subclinical hypothyroid.  In your age group I often will not start medication until the TSH is 6 or there is consistent elevation.  I would like to recheck this in 6 weeks via an outpatient lab visit, if you continue to trend up we may start a low dose of Levothyroxine.  Any questions?  Please schedule lab visit for 6 weeks.   Keep being awesome!!  Thank you for allowing me to participate in your care. Kindest regards, Ryson Bacha

## 2021-03-17 NOTE — Telephone Encounter (Signed)
Documented in result notes. 

## 2021-03-20 ENCOUNTER — Ambulatory Visit: Payer: Medicare Other | Admitting: Nurse Practitioner

## 2021-04-01 ENCOUNTER — Encounter: Payer: Self-pay | Admitting: Gastroenterology

## 2021-04-01 ENCOUNTER — Ambulatory Visit (INDEPENDENT_AMBULATORY_CARE_PROVIDER_SITE_OTHER): Payer: Medicare Other | Admitting: Gastroenterology

## 2021-04-01 ENCOUNTER — Other Ambulatory Visit: Payer: Self-pay

## 2021-04-01 VITALS — BP 122/70 | HR 65 | Temp 97.3°F | Ht 66.0 in | Wt 163.0 lb

## 2021-04-01 DIAGNOSIS — R232 Flushing: Secondary | ICD-10-CM | POA: Diagnosis not present

## 2021-04-01 DIAGNOSIS — R197 Diarrhea, unspecified: Secondary | ICD-10-CM

## 2021-04-01 NOTE — Progress Notes (Signed)
[name], MD [address]  [place]  [place], [place] [address]  Main: [phone]  Fax: [phone]   Primary Care Physician: Venita Lick, NP   Chief Complaint  Patient presents with  . Diarrhea    HPI: SKYLAN LARA is a 87 y.o. male here for follow up flushing and diarrhea.  When patient was last seen Octreoscan was ordered, but patient never got this done.  Patient wife states that he had a lot going on at that time and chose not to get it done.  He continues to have very episodic bowel movements where he will get warm and flushed in his face, and then have 3-4 loose bowel movements.  These episodes occur about once or twice a month.  The last severe episode was in March and patient states he was still warm he had to take all of his clothes off in May on the floor.  He does not have any vomiting during these episodes.  He did have a mild episode like this in the morning where he felt flushed in her face, and had 3-4 loose bowel movements.  In between these episodes he has 1-2 soft bowel movements daily.  No blood in stool.  No weight loss.  Current Outpatient Medications  Medication Sig Dispense Refill  . acetaminophen (TYLENOL) 500 MG tablet Take 500 mg by mouth every 6 (six) hours as needed.    Marland Kitchen aspirin EC 81 MG tablet Take 81 mg by mouth once.     . calcium-vitamin D (OSCAL WITH D) 500-200 MG-UNIT tablet Take 1 tablet by mouth.    . cyanocobalamin 500 MCG tablet Take 500 mcg by mouth daily.    . fexofenadine (ALLEGRA) 180 MG tablet Take 180 mg by mouth as needed for allergies.     Marland Kitchen lisinopril (ZESTRIL) 10 MG tablet Take 1 tablet (10 mg total) by mouth daily. 90 tablet 4  . loperamide (IMODIUM A-D) 2 MG tablet Take 2 mg by mouth 4 (four) times daily as needed for diarrhea or loose stools.    . Multiple Vitamin (MULTI-VITAMINS) TABS Take by mouth daily.     . pantoprazole (PROTONIX) 40 MG tablet Take 1 tablet (40 mg total) by mouth daily. 90 tablet 4    No current facility-administered medications for this visit.    Allergies as of 04/01/2021  . (No Known Allergies)    ROS:  General: Negative for anorexia, weight loss, fever, chills, fatigue, weakness. ENT: Negative for hoarseness, difficulty swallowing , nasal congestion. CV: Negative for chest pain, angina, palpitations, dyspnea on exertion, peripheral edema.  Respiratory: Negative for dyspnea at rest, dyspnea on exertion, cough, sputum, wheezing.  GI: See history of present illness. GU:  Negative for dysuria, hematuria, urinary incontinence, urinary frequency, nocturnal urination.  Endo: Negative for unusual weight change.    Physical Examination:   BP 122/70   Pulse 65   Temp (!) 97.3 F (36.3 C) (Oral)   Ht 5\' 6"  (1.676 m)   Wt 163 lb (73.9 kg)   BMI 26.31 kg/m   General: Well-nourished, well-developed in no acute distress.  Eyes: No icterus. Conjunctivae pink. Mouth: Oropharyngeal mucosa moist and pink , no lesions erythema or exudate. Neck: Supple, Trachea midline Abdomen: Bowel sounds are normal, nontender, nondistended, no hepatosplenomegaly or masses, no abdominal bruits or hernia , no rebound or guarding.   Extremities: No lower extremity edema. No clubbing or deformities. Neuro: Alert and oriented x 3.  Grossly intact.  Skin: Warm and dry, no jaundice.   Psych: Alert and cooperative, normal mood and affect.   Labs: CMP     Component Value Date/Time   NA 138 03/16/2021 1341   K 4.4 03/16/2021 1341   CL 101 03/16/2021 1341   CO2 20 03/16/2021 1341   GLUCOSE 83 03/16/2021 1341   BUN 17 03/16/2021 1341   CREATININE 1.53 (H) 03/16/2021 1341   CALCIUM 8.9 03/16/2021 1341   PROT 7.0 03/16/2021 1341   ALBUMIN 4.4 03/16/2021 1341   AST 33 03/16/2021 1341   ALT 33 03/16/2021 1341   ALKPHOS 75 03/16/2021 1341   BILITOT 0.4 03/16/2021 1341   GFRNONAA 43 (L) 09/18/2020 1212   GFRAA 50 (L) 09/18/2020 1212   Lab Results  Component Value Date   WBC 6.6  03/16/2021   HGB 13.9 03/16/2021   HCT 41.3 03/16/2021   MCV 92 03/16/2021   PLT 230 03/16/2021    Imaging Studies: No results found.  Assessment and Plan:   BLYTHE HARTSHORN is a 87 y.o. y/o male here for follow-up flushing and diarrhea  Octreoscan was previously ordered and patient did not get it done and was willing to get it done at this point.  Will repeat chromogranin A level since this was elevated before  We will also obtain fecal pancreatic elastase for diarrhea, although symptoms are very episodic and unlikely to be from pancreatic insufficiency, but will complete work-up given ongoing symptoms  His previous CT abdomen also showed lung nodules versus chronic scarring and Dr. Evert Kohl clinic notes from August 2020 mention this and he had recommended CT chest that was ordered by them.  Patient to follow-up with PCP in this regard as well.  This note is being CCed to his PCP as well  Dr [name]

## 2021-04-02 ENCOUNTER — Other Ambulatory Visit: Payer: Self-pay | Admitting: Gastroenterology

## 2021-04-02 DIAGNOSIS — R232 Flushing: Secondary | ICD-10-CM | POA: Diagnosis not present

## 2021-04-02 DIAGNOSIS — R197 Diarrhea, unspecified: Secondary | ICD-10-CM | POA: Diagnosis not present

## 2021-04-03 LAB — CHROMOGRANIN A: Chromogranin A (ng/mL): 440.9 ng/mL — ABNORMAL HIGH (ref 0.0–101.8)

## 2021-04-09 LAB — PANCREATIC ELASTASE, FECAL: Pancreatic Elastase, Fecal: 271 ug Elast./g (ref >200)

## 2021-04-13 ENCOUNTER — Other Ambulatory Visit: Payer: Self-pay

## 2021-04-13 ENCOUNTER — Ambulatory Visit (INDEPENDENT_AMBULATORY_CARE_PROVIDER_SITE_OTHER): Payer: Medicare Other | Admitting: Internal Medicine

## 2021-04-13 ENCOUNTER — Encounter: Payer: Self-pay | Admitting: Internal Medicine

## 2021-04-13 DIAGNOSIS — R7989 Other specified abnormal findings of blood chemistry: Secondary | ICD-10-CM

## 2021-04-13 DIAGNOSIS — N1831 Chronic kidney disease, stage 3a: Secondary | ICD-10-CM | POA: Diagnosis not present

## 2021-04-13 DIAGNOSIS — G609 Hereditary and idiopathic neuropathy, unspecified: Secondary | ICD-10-CM

## 2021-04-13 DIAGNOSIS — Z136 Encounter for screening for cardiovascular disorders: Secondary | ICD-10-CM

## 2021-04-13 DIAGNOSIS — I10 Essential (primary) hypertension: Secondary | ICD-10-CM | POA: Diagnosis not present

## 2021-04-13 DIAGNOSIS — Z125 Encounter for screening for malignant neoplasm of prostate: Secondary | ICD-10-CM | POA: Diagnosis not present

## 2021-04-13 NOTE — Progress Notes (Signed)
BP 108/70   Pulse 67   Temp (!) 97.2 F (36.2 C) (Oral)   Ht 5\' 8"  (1.727 m)   Wt 163 lb (73.9 kg)   SpO2 97%   BMI 24.78 kg/m    Subjective:    Patient ID: Vincent Koch, male    DOB: 1934/04/21, 87 y.o.   MRN: [id number]  HPI: Vincent Koch is a 87 y.o. male  Diarrhea  This is a recurrent (to see Dr. Bonna Gains for such , has D some days and intermittently and needs meds for D and constipation ) problem.    Chief Complaint  Patient presents with  . thyroid  . Diarrhea     Follow up from last visit     Relevant past medical, surgical, family and social history reviewed and updated as indicated. Interim medical history since our last visit reviewed. Allergies and medications reviewed and updated.  Review of Systems  Gastrointestinal: Positive for diarrhea.    Per HPI unless specifically indicated above     Objective:    BP 108/70   Pulse 67   Temp (!) 97.2 F (36.2 C) (Oral)   Ht 5\' 8"  (1.727 m)   Wt 163 lb (73.9 kg)   SpO2 97%   BMI 24.78 kg/m   Wt Readings from Last 3 Encounters:  04/13/21 163 lb (73.9 kg)  04/01/21 163 lb (73.9 kg)  03/16/21 163 lb 12.8 oz (74.3 kg)    Physical Exam  Results for orders placed or performed in visit on 04/01/21  Chromogranin A  Result Value Ref Range   Chromogranin A (ng/mL) 440.9 (H) 0.0 - 101.8 ng/mL        Current Outpatient Medications:  .  acetaminophen (TYLENOL) 500 MG tablet, Take 500 mg by mouth every 6 (six) hours as needed., Disp: , Rfl:  .  aspirin EC 81 MG tablet, Take 81 mg by mouth once. , Disp: , Rfl:  .  calcium-vitamin D (OSCAL WITH D) 500-200 MG-UNIT tablet, Take 1 tablet by mouth., Disp: , Rfl:  .  cyanocobalamin 500 MCG tablet, Take 500 mcg by mouth daily., Disp: , Rfl:  .  fexofenadine (ALLEGRA) 180 MG tablet, Take 180 mg by mouth as needed for allergies. , Disp: , Rfl:  .  lisinopril (ZESTRIL) 10 MG tablet, Take 1 tablet (10 mg total) by mouth daily., Disp: 90 tablet, Rfl: 4 .   loperamide (IMODIUM A-D) 2 MG tablet, Take 2 mg by mouth 4 (four) times daily as needed for diarrhea or loose stools., Disp: , Rfl:  .  Multiple Vitamin (MULTI-VITAMINS) TABS, Take by mouth daily. , Disp: , Rfl:  .  pantoprazole (PROTONIX) 40 MG tablet, Take 1 tablet (40 mg total) by mouth daily., Disp: 90 tablet, Rfl: 4    Assessment & Plan:   1. CKD  GFR - 43.   Ref. Range 12/07/2018 14:26 12/28/2018 14:31 08/20/2019 15:40 09/18/2020 12:12 03/16/2021 13:41  BUN Latest Ref Range: 8 - 27 mg/dL  15 15 13 17   Creatinine Latest Ref Range: 0.76 - 1.27 mg/dL  1.36 (H) 1.43 (H) 1.48 (H) 1.53 (H)   2. HYPOTHYROIDISM ? TSH - slightly up FT4 slighlty low.   Ref. Range 08/20/2019 15:40 09/18/2020 12:12 03/16/2021 13:41  TSH Latest Ref Range: 0.450 - 4.500 uIU/mL 4.540 (H) 4.220 4.740 (H)  T4,Free(Direct) Latest Ref Range: 0.82 - 1.77 ng/dL  0.91 0.80 (L)   HYPOTHYROIDISM PLEASE TAKE YOUR THYROID MEDICATION FIRST THING IN THE MORNING WHILST  FASTING.  NO MEDICATION/ FOOD FOR AN HOUR AFTER INGESTING THYROID PILLS.  3. HTN On lisinopril Continue current meds.  Medication compliance emphasised. pt advised to keep Bp logs. Pt verbalised understanding of the same. Pt to have a low salt diet . Exercise to reach a goal of at least 150 mins a week.  lifestyle modifications explained and pt understands importance of the above.  Follow up plan: Return in about 4 weeks (around 05/11/2021).

## 2021-04-13 NOTE — Patient Instructions (Signed)
Hypothyroidism  Hypothyroidism is when the thyroid gland does not make enough of certain hormones (it is underactive). The thyroid gland is a small gland located in the lower front part of the neck, just in front of the windpipe (trachea). This gland makes hormones that help control how the body uses food for energy (metabolism) as well as how the heart and brain function. These hormones also play a role in keeping your bones strong. When the thyroid is underactive, it produces too little of the hormones thyroxine (T4) and triiodothyronine (T3). What are the causes? This condition may be caused by:  Hashimoto's disease. This is a disease in which the body's disease-fighting system (immune system) attacks the thyroid gland. This is the most common cause.  Viral infections.  Pregnancy.  Certain medicines.  Birth defects.  Past radiation treatments to the head or neck for cancer.  Past treatment with radioactive iodine.  Past exposure to radiation in the environment.  Past surgical removal of part or all of the thyroid.  Problems with a gland in the center of the brain (pituitary gland).  Lack of enough iodine in the diet. What increases the risk? You are more likely to develop this condition if:  You are male.  You have a family history of thyroid conditions.  You use a medicine called lithium.  You take medicines that affect the immune system (immunosuppressants). What are the signs or symptoms? Symptoms of this condition include:  Feeling as though you have no energy (lethargy).  Not being able to tolerate cold.  Weight gain that is not explained by a change in diet or exercise habits.  Lack of appetite.  Dry skin.  Coarse hair.  Menstrual irregularity.  Slowing of thought processes.  Constipation.  Sadness or depression. How is this diagnosed? This condition may be diagnosed based on:  Your symptoms, your medical history, and a physical exam.  Blood  tests. You may also have imaging tests, such as an ultrasound or MRI. How is this treated? This condition is treated with medicine that replaces the thyroid hormones that your body does not make. After you begin treatment, it may take several weeks for symptoms to go away. Follow these instructions at home:  Take over-the-counter and prescription medicines only as told by your health care provider.  If you start taking any new medicines, tell your health care provider.  Keep all follow-up visits as told by your health care provider. This is important. ? As your condition improves, your dosage of thyroid hormone medicine may change. ? You will need to have blood tests regularly so that your health care provider can monitor your condition. Contact a health care provider if:  Your symptoms do not get better with treatment.  You are taking thyroid hormone replacement medicine and you: ? Sweat a lot. ? Have tremors. ? Feel anxious. ? Lose weight rapidly. ? Cannot tolerate heat. ? Have emotional swings. ? Have diarrhea. ? Feel weak. Get help right away if you have:  Chest pain.  An irregular heartbeat.  A rapid heartbeat.  Difficulty breathing. Summary  Hypothyroidism is when the thyroid gland does not make enough of certain hormones (it is underactive).  When the thyroid is underactive, it produces too little of the hormones thyroxine (T4) and triiodothyronine (T3).  The most common cause is Hashimoto's disease, a disease in which the body's disease-fighting system (immune system) attacks the thyroid gland. The condition can also be caused by viral infections, medicine, pregnancy, or   past radiation treatment to the head or neck.  Symptoms may include weight gain, dry skin, constipation, feeling as though you do not have energy, and not being able to tolerate cold.  This condition is treated with medicine to replace the thyroid hormones that your body does not make. This  information is not intended to replace advice given to you by your health care provider. Make sure you discuss any questions you have with your health care provider. Document Revised: 08/08/2020 Document Reviewed: 07/24/2020 [employer]vier Patient Education  2021 [employer]  

## 2021-04-22 ENCOUNTER — Ambulatory Visit
Admission: RE | Admit: 2021-04-22 | Discharge: 2021-04-22 | Disposition: A | Discharge status: Home / Self Care | Payer: Medicare Other | Source: Ambulatory Visit | Attending: Gastroenterology | Admitting: Gastroenterology

## 2021-04-22 ENCOUNTER — Other Ambulatory Visit: Payer: Self-pay

## 2021-04-22 DIAGNOSIS — C7A00 Malignant carcinoid tumor of unspecified site: Secondary | ICD-10-CM | POA: Diagnosis not present

## 2021-04-22 DIAGNOSIS — K449 Diaphragmatic hernia without obstruction or gangrene: Secondary | ICD-10-CM | POA: Insufficient documentation

## 2021-04-22 DIAGNOSIS — E340 Carcinoid syndrome: Secondary | ICD-10-CM | POA: Diagnosis not present

## 2021-04-22 DIAGNOSIS — I700 Atherosclerosis of aorta: Secondary | ICD-10-CM | POA: Insufficient documentation

## 2021-04-22 DIAGNOSIS — R197 Diarrhea, unspecified: Secondary | ICD-10-CM | POA: Diagnosis not present

## 2021-04-22 DIAGNOSIS — R978 Other abnormal tumor markers: Secondary | ICD-10-CM | POA: Insufficient documentation

## 2021-04-22 MED ORDER — GALLIUM GA 68 DOTATATE IV KIT
4.3500 | PACK | Freq: Once | INTRAVENOUS | Status: AC | PRN
  Administered 2021-04-22: 4.35 via INTRAVENOUS

## 2021-05-04 ENCOUNTER — Other Ambulatory Visit: Payer: Self-pay

## 2021-05-04 ENCOUNTER — Other Ambulatory Visit: Payer: Medicare Other

## 2021-05-04 DIAGNOSIS — N1831 Chronic kidney disease, stage 3a: Secondary | ICD-10-CM

## 2021-05-04 DIAGNOSIS — Z125 Encounter for screening for malignant neoplasm of prostate: Secondary | ICD-10-CM | POA: Diagnosis not present

## 2021-05-04 DIAGNOSIS — R7989 Other specified abnormal findings of blood chemistry: Secondary | ICD-10-CM | POA: Diagnosis not present

## 2021-05-04 DIAGNOSIS — I10 Essential (primary) hypertension: Secondary | ICD-10-CM

## 2021-05-04 DIAGNOSIS — G609 Hereditary and idiopathic neuropathy, unspecified: Secondary | ICD-10-CM

## 2021-05-04 DIAGNOSIS — Z136 Encounter for screening for cardiovascular disorders: Secondary | ICD-10-CM

## 2021-05-05 LAB — COMPREHENSIVE METABOLIC PANEL
ALT: 36 IU/L (ref 0–44)
AST: 36 IU/L (ref 0–40)
Albumin/Globulin Ratio: 1.8 (ref 1.2–2.2)
Albumin: 4.4 g/dL (ref 3.6–4.6)
Alkaline Phosphatase: 77 IU/L (ref 44–121)
BUN/Creatinine Ratio: 12 (ref 10–24)
BUN: 19 mg/dL (ref 8–27)
Bilirubin Total: 0.4 mg/dL (ref 0.0–1.2)
CO2: 21 mmol/L (ref 20–29)
Calcium: 8.4 mg/dL — ABNORMAL LOW (ref 8.6–10.2)
Chloride: 99 mmol/L (ref 96–106)
Creatinine, Ser: 1.55 mg/dL — ABNORMAL HIGH (ref 0.76–1.27)
Globulin, Total: 2.5 g/dL (ref 1.5–4.5)
Glucose: 86 mg/dL (ref 65–99)
Potassium: 4.6 mmol/L (ref 3.5–5.2)
Sodium: 136 mmol/L (ref 134–144)
Total Protein: 6.9 g/dL (ref 6.0–8.5)
eGFR: 44 mL/min/{1.73_m2} — ABNORMAL LOW (ref >59)

## 2021-05-05 LAB — CBC WITH DIFFERENTIAL/PLATELET
Basophils Absolute: 0.1 10*3/uL (ref 0.0–0.2)
Basos: 1 %
EOS (ABSOLUTE): 0.4 10*3/uL (ref 0.0–0.4)
Eos: 5 %
Hematocrit: 42.6 % (ref 37.5–51.0)
Hemoglobin: 14.3 g/dL (ref 13.0–17.7)
Immature Grans (Abs): 0 10*3/uL (ref 0.0–0.1)
Immature Granulocytes: 0 %
Lymphocytes Absolute: 1.8 10*3/uL (ref 0.7–3.1)
Lymphs: 23 %
MCH: 31.2 pg (ref 26.6–33.0)
MCHC: 33.6 g/dL (ref 31.5–35.7)
MCV: 93 fL (ref 79–97)
Monocytes Absolute: 0.8 10*3/uL (ref 0.1–0.9)
Monocytes: 11 %
Neutrophils Absolute: 4.5 10*3/uL (ref 1.4–7.0)
Neutrophils: 60 %
Platelets: 229 10*3/uL (ref 150–450)
RBC: 4.58 x10E6/uL (ref 4.14–5.80)
RDW: 12.9 % (ref 11.6–15.4)
WBC: 7.5 10*3/uL (ref 3.4–10.8)

## 2021-05-05 LAB — LIPID PANEL
Chol/HDL Ratio: 5.4 ratio — ABNORMAL HIGH (ref 0.0–5.0)
Cholesterol, Total: 206 mg/dL — ABNORMAL HIGH (ref 100–199)
HDL: 38 mg/dL — ABNORMAL LOW (ref >39)
LDL Chol Calc (NIH): 117 mg/dL — ABNORMAL HIGH (ref 0–99)
Triglycerides: 292 mg/dL — ABNORMAL HIGH (ref 0–149)
VLDL Cholesterol Cal: 51 mg/dL — ABNORMAL HIGH (ref 5–40)

## 2021-05-05 LAB — THYROID PANEL WITH TSH
Free Thyroxine Index: 1.1 — ABNORMAL LOW (ref 1.2–4.9)
T3 Uptake Ratio: 27 % (ref 24–39)
T4, Total: 3.9 ug/dL — ABNORMAL LOW (ref 4.5–12.0)
TSH: 6.93 u[IU]/mL — ABNORMAL HIGH (ref 0.450–4.500)

## 2021-05-05 LAB — PSA TOTAL+% FREE (SERIAL)
PSA, Free Pct: 34.5 %
PSA, Free: 1.07 ng/mL
Prostate Specific Ag, Serum: 3.1 ng/mL (ref 0.0–4.0)

## 2021-05-06 ENCOUNTER — Ambulatory Visit: Payer: Medicare Other | Admitting: Gastroenterology

## 2021-05-11 ENCOUNTER — Other Ambulatory Visit: Payer: Self-pay

## 2021-05-11 ENCOUNTER — Encounter: Payer: Self-pay | Admitting: Internal Medicine

## 2021-05-11 ENCOUNTER — Ambulatory Visit (INDEPENDENT_AMBULATORY_CARE_PROVIDER_SITE_OTHER): Payer: Medicare Other | Admitting: Internal Medicine

## 2021-05-11 VITALS — BP 109/66 | HR 52 | Temp 97.8°F | Ht 67.99 in | Wt 162.8 lb

## 2021-05-11 DIAGNOSIS — N400 Enlarged prostate without lower urinary tract symptoms: Secondary | ICD-10-CM

## 2021-05-11 DIAGNOSIS — R7989 Other specified abnormal findings of blood chemistry: Secondary | ICD-10-CM

## 2021-05-11 DIAGNOSIS — K5900 Constipation, unspecified: Secondary | ICD-10-CM | POA: Diagnosis not present

## 2021-05-11 MED ORDER — FENOFIBRATE 145 MG PO TABS: 145.0000 mg | ORAL_TABLET | Freq: Every day | ORAL | 4 refills | Status: DC

## 2021-05-11 NOTE — Progress Notes (Signed)
There were no vitals taken for this visit.   Subjective:    Patient ID: Vincent Koch, male    DOB: 02/18/1934, 87 y.o.   MRN: [id number]  No chief complaint on file.   HPI: Vincent Koch is a 87 y.o. male  Pt is here for a follow up , has an elevated Creatnine today, elevated TSH   Thyroid Problem Presents for follow-up visit. Symptoms include constipation and heat intolerance. Patient reports no anxiety, cold intolerance, depressed mood, diaphoresis, diarrhea, dry skin, fatigue, hair loss, hoarse voice, leg swelling, menstrual problem, nail problem, palpitations, tremors, visual change, weight gain or weight loss.   No chief complaint on file.   Relevant past medical, surgical, family and social history reviewed and updated as indicated. Interim medical history since our last visit reviewed. Allergies and medications reviewed and updated.  Review of Systems  Constitutional:  Negative for diaphoresis, fatigue, weight gain and weight loss.  HENT:  Negative for hoarse voice.   Cardiovascular:  Negative for palpitations.  Gastrointestinal:  Positive for constipation. Negative for diarrhea.  Endocrine: Positive for heat intolerance. Negative for cold intolerance.  Genitourinary:  Negative for menstrual problem.  Neurological:  Negative for tremors.  Psychiatric/Behavioral:  The patient is not nervous/anxious.    Per HPI unless specifically indicated above     Objective:    There were no vitals taken for this visit.  Wt Readings from Last 3 Encounters:  04/13/21 163 lb (73.9 kg)  04/01/21 163 lb (73.9 kg)  03/16/21 163 lb 12.8 oz (74.3 kg)    Physical Exam Vitals and nursing note reviewed.  Constitutional:      General: He is not in acute distress.    Appearance: Normal appearance. He is not ill-appearing or diaphoretic.  HENT:     Head: Normocephalic and atraumatic.     Right Ear: Tympanic membrane and external ear normal. There is no impacted cerumen.      Left Ear: External ear normal.     Nose: No congestion or rhinorrhea.     Mouth/Throat:     Pharynx: No oropharyngeal exudate or posterior oropharyngeal erythema.  Eyes:     Conjunctiva/sclera: Conjunctivae normal.     Pupils: Pupils are equal, round, and reactive to light.  Cardiovascular:     Rate and Rhythm: Normal rate and regular rhythm.     Heart sounds: No murmur heard.   No friction rub. No gallop.  Pulmonary:     Effort: No respiratory distress.     Breath sounds: No stridor. No wheezing or rhonchi.  Chest:     Chest wall: No tenderness.  Abdominal:     General: Abdomen is flat. Bowel sounds are normal.     Palpations: Abdomen is soft. There is no mass.     Tenderness: There is no abdominal tenderness.  Musculoskeletal:     Cervical back: Normal range of motion and neck supple. No rigidity or tenderness.     Left lower leg: No edema.  Skin:    General: Skin is warm and dry.  Neurological:     Mental Status: He is alert.   Results for orders placed or performed in visit on 05/04/21  Lipid panel  Result Value Ref Range   Cholesterol, Total 206 (H) 100 - 199 mg/dL   Triglycerides 292 (H) 0 - 149 mg/dL   HDL 38 (L) >39 mg/dL   VLDL Cholesterol Cal 51 (H) 5 - 40 mg/dL   LDL Chol Calc (  NIH) 117 (H) 0 - 99 mg/dL   Chol/HDL Ratio 5.4 (H) 0.0 - 5.0 ratio  Thyroid Panel With TSH  Result Value Ref Range   TSH 6.930 (H) 0.450 - 4.500 uIU/mL   T4, Total 3.9 (L) 4.5 - 12.0 ug/dL   T3 Uptake Ratio 27 24 - 39 %   Free Thyroxine Index 1.1 (L) 1.2 - 4.9  CBC with Differential/Platelet  Result Value Ref Range   WBC 7.5 3.4 - 10.8 x10E3/uL   RBC 4.58 4.14 - 5.80 x10E6/uL   Hemoglobin 14.3 13.0 - 17.7 g/dL   Hematocrit 42.6 37.5 - 51.0 %   MCV 93 79 - 97 fL   MCH 31.2 26.6 - 33.0 pg   MCHC 33.6 31.5 - 35.7 g/dL   RDW 12.9 11.6 - 15.4 %   Platelets 229 150 - 450 x10E3/uL   Neutrophils 60 Not Estab. %   Lymphs 23 Not Estab. %   Monocytes 11 Not Estab. %   Eos 5 Not Estab. %    Basos 1 Not Estab. %   Neutrophils Absolute 4.5 1.4 - 7.0 x10E3/uL   Lymphocytes Absolute 1.8 0.7 - 3.1 x10E3/uL   Monocytes Absolute 0.8 0.1 - 0.9 x10E3/uL   EOS (ABSOLUTE) 0.4 0.0 - 0.4 x10E3/uL   Basophils Absolute 0.1 0.0 - 0.2 x10E3/uL   Immature Granulocytes 0 Not Estab. %   Immature Grans (Abs) 0.0 0.0 - 0.1 x10E3/uL  Comprehensive metabolic panel  Result Value Ref Range   Glucose 86 65 - 99 mg/dL   BUN 19 8 - 27 mg/dL   Creatinine, Ser 1.55 (H) 0.76 - 1.27 mg/dL   eGFR 44 (L) >59 mL/min/1.73   BUN/Creatinine Ratio 12 10 - 24   Sodium 136 134 - 144 mmol/L   Potassium 4.6 3.5 - 5.2 mmol/L   Chloride 99 96 - 106 mmol/L   CO2 21 20 - 29 mmol/L   Calcium 8.4 (L) 8.6 - 10.2 mg/dL   Total Protein 6.9 6.0 - 8.5 g/dL   Albumin 4.4 3.6 - 4.6 g/dL   Globulin, Total 2.5 1.5 - 4.5 g/dL   Albumin/Globulin Ratio 1.8 1.2 - 2.2   Bilirubin Total 0.4 0.0 - 1.2 mg/dL   Alkaline Phosphatase 77 44 - 121 IU/L   AST 36 0 - 40 IU/L   ALT 36 0 - 44 IU/L  PSA Total+%Free (Serial)  Result Value Ref Range   Prostate Specific Ag, Serum 3.1 0.0 - 4.0 ng/mL   PSA, Free 1.07 N/A ng/mL   PSA, Free Pct 34.5 %        Current Outpatient Medications:    acetaminophen (TYLENOL) 500 MG tablet, Take 500 mg by mouth every 6 (six) hours as needed., Disp: , Rfl:    aspirin EC 81 MG tablet, Take 81 mg by mouth once. , Disp: , Rfl:    calcium-vitamin D (OSCAL WITH D) 500-200 MG-UNIT tablet, Take 1 tablet by mouth., Disp: , Rfl:    cyanocobalamin 500 MCG tablet, Take 500 mcg by mouth daily., Disp: , Rfl:    fexofenadine (ALLEGRA) 180 MG tablet, Take 180 mg by mouth as needed for allergies. , Disp: , Rfl:    lisinopril (ZESTRIL) 10 MG tablet, Take 1 tablet (10 mg total) by mouth daily., Disp: 90 tablet, Rfl: 4   loperamide (IMODIUM A-D) 2 MG tablet, Take 2 mg by mouth 4 (four) times daily as needed for diarrhea or loose stools., Disp: , Rfl:    Multiple Vitamin (MULTI-VITAMINS)  TABS, Take by mouth daily. ,  Disp: , Rfl:    pantoprazole (PROTONIX) 40 MG tablet, Take 1 tablet (40 mg total) by mouth daily., Disp: 90 tablet, Rfl: 4    Assessment & Plan:  HLD  Trigliceride high today   Ref. Range 09/18/2020 12:12 05/04/2021 13:11  Total CHOL/HDL Ratio Latest Ref Range: 0.0 - 5.0 ratio  5.4 (H)  Cholesterol, Total Latest Ref Range: 100 - 199 mg/dL 222 (H) 206 (H)  HDL Cholesterol Latest Ref Range: >39 mg/dL 41 38 (L)  Triglycerides Latest Ref Range: 0 - 149 mg/dL 293 (H) 292 (H)  VLDL Cholesterol Cal Latest Ref Range: 5 - 40 mg/dL 52 (H) 51 (H)  LDL Chol Calc (NIH) Latest Ref Range: 0 - 99 mg/dL 129 (H) 117 (H)  recheck FLP, check LFT's work on diet, SE of meds explained to pt. low fat and high fiber diet explained to pt.  Elevayted Creat recheck today    Problem List Items Addressed This Visit   None    No orders of the defined types were placed in this encounter.    No orders of the defined types were placed in this encounter.    Follow up plan: No follow-ups on file.

## 2021-05-12 ENCOUNTER — Telehealth: Payer: Self-pay

## 2021-05-12 ENCOUNTER — Encounter: Payer: Self-pay | Admitting: Internal Medicine

## 2021-05-12 ENCOUNTER — Other Ambulatory Visit: Payer: Self-pay | Admitting: Internal Medicine

## 2021-05-12 DIAGNOSIS — E039 Hypothyroidism, unspecified: Secondary | ICD-10-CM

## 2021-05-12 DIAGNOSIS — R7989 Other specified abnormal findings of blood chemistry: Secondary | ICD-10-CM

## 2021-05-12 LAB — BASIC METABOLIC PANEL
BUN/Creatinine Ratio: 13 (ref 10–24)
BUN: 20 mg/dL (ref 8–27)
CO2: 19 mmol/L — ABNORMAL LOW (ref 20–29)
Calcium: 9.8 mg/dL (ref 8.6–10.2)
Chloride: 102 mmol/L (ref 96–106)
Creatinine, Ser: 1.55 mg/dL — ABNORMAL HIGH (ref 0.76–1.27)
Glucose: 88 mg/dL (ref 65–99)
Potassium: 4.5 mmol/L (ref 3.5–5.2)
Sodium: 139 mmol/L (ref 134–144)
eGFR: 44 mL/min/{1.73_m2} — ABNORMAL LOW (ref >59)

## 2021-05-12 LAB — TSH: TSH: 4.88 u[IU]/mL — ABNORMAL HIGH (ref 0.450–4.500)

## 2021-05-12 LAB — T4, FREE: Free T4: 0.78 ng/dL — ABNORMAL LOW (ref 0.82–1.77)

## 2021-05-12 MED ORDER — LEVOTHYROXINE SODIUM 25 MCG PO TABS: 25.0000 ug | ORAL_TABLET | Freq: Every day | ORAL | 2 refills | Status: DC

## 2021-05-12 NOTE — Progress Notes (Signed)
Pl let pt know has hypothyroidism - Will start pt on 25 mcg of synthroid  Check [place] thryoid  Fu with me x 3 weeks with results thnx

## 2021-05-12 NOTE — Telephone Encounter (Signed)
Called and informed Patient and his wife that he has an appt on 7/6 at 102 at [place] [id number] for an U/S of patient's thyroid

## 2021-05-14 ENCOUNTER — Ambulatory Visit (INDEPENDENT_AMBULATORY_CARE_PROVIDER_SITE_OTHER): Payer: Medicare Other | Admitting: Gastroenterology

## 2021-05-14 ENCOUNTER — Encounter: Payer: Self-pay | Admitting: Gastroenterology

## 2021-05-14 ENCOUNTER — Other Ambulatory Visit: Payer: Self-pay

## 2021-05-14 VITALS — BP 105/66 | HR 65 | Ht 67.99 in | Wt 161.8 lb

## 2021-05-14 DIAGNOSIS — R197 Diarrhea, unspecified: Secondary | ICD-10-CM | POA: Diagnosis not present

## 2021-05-14 DIAGNOSIS — R232 Flushing: Secondary | ICD-10-CM

## 2021-05-14 NOTE — Progress Notes (Signed)
Spoke with Ana in the lab and she stated that she talked with Dr. Neomia Dear and she stated that Dr. Neomia Dear was going to wait until later for the GFR on follow up.

## 2021-05-14 NOTE — Progress Notes (Signed)
Vincent Antigua, MD [address]  [place]  [place], [place] [address]  Main: [phone]  Fax: [phone]   Primary Care Physician: Charlynne Cousins, MD   Chief complaint: follow-up of diarrhea  HPI: Vincent Koch is a 87 y.o. male presents with his wife.  Both patient and wife help provide history and states that his diarrhea and episodes of flushing and diarrhea have much improved since previous visit.  He states the last such episode he has had was now in March and none since then.  They seem to think that the Metamucil that he was started on has made a lot of difference in regulating his bowels and he has 1-2 soft bowel movements a day and no further episodes of intermittent diarrhea with flushing.  Denies any abdominal pain, nausea or vomiting.  Since last visit his chromogranin A level was repeated and was persistently elevated  He also underwent dotatate scan which did not show any GI lesions but did show uptake in the prostate gland.  Patient states he has since been seen by his PCP and they have referred him to urology for this.  PCP note reviewed and have also started him on medications for hypothyroidism and plan on thyroid ultrasound   ROS: All ROS reviewed and negative except as per HPI   Past Medical History:  Diagnosis Date   Bradycardia    Chronic kidney disease    GERD (gastroesophageal reflux disease)    Hypertension    Neuropathy    toes   Osteoporosis    Peripheral neuropathy    Thyroid disease     Past Surgical History:  Procedure Laterality Date   CATARACT EXTRACTION W/PHACO Right 06/27/2019   Procedure: CATARACT EXTRACTION PHACO AND INTRAOCULAR LENS PLACEMENT ([facility])  RIGHT;  Surgeon: Leandrew Koyanagi, MD;  Location: [facility];  Service: Ophthalmology;  Laterality: Right;  VISION BLUE   COLONOSCOPY WITH PROPOFOL N/A 12/11/2018   Procedure: COLONOSCOPY WITH PROPOFOL;  Surgeon: Virgel Manifold, MD;  Location: [facility]  ENDOSCOPY;  Service: Endoscopy;  Laterality: N/A;   ESOPHAGEAL DILATION N/A 06/14/2016   Procedure: ESOPHAGOGASTRODUODENOSCOPY (EGD) WITH PROPOFOL with dilation;  Surgeon: Lucilla Lame, MD;  Location: [facility];  Service: Endoscopy;  Laterality: N/A;   ESOPHAGOGASTRODUODENOSCOPY (EGD) WITH PROPOFOL N/A 07/15/2016   Procedure: ESOPHAGOGASTRODUODENOSCOPY (EGD) WITH DILATION;  Surgeon: Lucilla Lame, MD;  Location: [facility];  Service: Endoscopy;  Laterality: N/A;   ESOPHAGOGASTRODUODENOSCOPY (EGD) WITH PROPOFOL N/A 12/11/2018   Procedure: ESOPHAGOGASTRODUODENOSCOPY (EGD) WITH PROPOFOL;  Surgeon: Virgel Manifold, MD;  Location: [facility] ENDOSCOPY;  Service: Endoscopy;  Laterality: N/A;   HERNIA REPAIR     PARATHYROIDECTOMY      Prior to Admission medications   Medication Sig Start Date End Date Taking? Authorizing Provider  acetaminophen (TYLENOL) 500 MG tablet Take 500 mg by mouth every 6 (six) hours as needed.    [provider]  aspirin EC 81 MG tablet Take 81 mg by mouth once.  11/06/12   [provider]  calcium-vitamin D (OSCAL WITH D) 500-200 MG-UNIT tablet Take 1 tablet by mouth.    [provider]  cyanocobalamin 500 MCG tablet Take 500 mcg by mouth daily.    [provider]  fenofibrate (TRICOR) 145 MG tablet Take 1 tablet (145 mg total) by mouth daily. 05/11/21   Vigg, Avanti, MD  fexofenadine (ALLEGRA) 180 MG tablet Take 180 mg by mouth as needed for allergies.     [provider]  levothyroxine (SYNTHROID) 25 MCG tablet Take 1 tablet (25 mcg total) by mouth daily. 05/12/21   Vigg, Avanti, MD  lisinopril (ZESTRIL) 10 MG tablet Take 1 tablet (10 mg total) by mouth daily. 09/08/20   Cannady, Henrine Screws T, NP  loperamide (IMODIUM A-D) 2 MG tablet Take 2 mg by mouth 4 (four) times daily as needed for diarrhea or loose stools.    [provider]  Multiple Vitamin (MULTI-VITAMINS) TABS Take by mouth daily.     [provider]  pantoprazole (PROTONIX) 40 MG tablet Take 1 tablet (40 mg total) by mouth daily. 09/18/20   Venita Lick, NP    Family History  Problem Relation Age of Onset   Stroke Mother    Throat cancer Father      Social History   Tobacco Use   Smoking status: Never   Smokeless tobacco: Never  Vaping Use   Vaping Use: Never used  Substance Use Topics   Alcohol use: Never   Drug use: Never    Allergies as of 05/14/2021   (No Known Allergies)    Physical Examination:  Constitutional: General:   Alert,  Well-developed, well-nourished, pleasant and cooperative in NAD Vitals:   05/14/21 1309  BP: 105/66  Pulse: 65  Weight: 161 lb 12.8 oz (73.4 kg)  Height: 5' 7.99" (1.727 m)     Eyes:  Sclera clear, no icterus.   Conjunctiva pink. PERRLA  Ears:  No scars, lesions or masses, Normal auditory acuity. Nose:  No deformity, discharge, or lesions. Mouth:  No deformity or lesions, oropharynx pink & moist.  Neck:  Supple; no masses or thyromegaly.  Respiratory: Normal respiratory effort, Normal percussion  Gastrointestinal:  Normal bowel sounds.  No bruits.  Soft, non-tender and non-distended without masses, hepatosplenomegaly or hernias noted.  No guarding or rebound tenderness.     Cardiac: No clubbing or edema.  No cyanosis. Normal posterior tibial pedal pulses noted.  Lymphatic:  No significant cervical or axillary adenopathy.  Psych:  Alert and cooperative. Normal mood and affect.  Musculoskeletal:  Normal gait. Head normocephalic, atraumatic. Symmetrical without gross deformities. 5/5 Upper and Lower extremity strength bilaterally.  Skin: Warm. Intact without significant lesions or rashes. No jaundice.  Neurologic:  Face symmetrical, tongue midline, Normal sensation to touch;  grossly normal neurologically.  Psych:  Alert and oriented x3, Alert and cooperative. Normal mood and affect.  Labs: CMP     Component Value Date/Time   NA 139 05/11/2021  1334   K 4.5 05/11/2021 1334   CL 102 05/11/2021 1334   CO2 19 (L) 05/11/2021 1334   GLUCOSE 88 05/11/2021 1334   BUN 20 05/11/2021 1334   CREATININE 1.55 (H) 05/11/2021 1334   CALCIUM 9.8 05/11/2021 1334   PROT 6.9 05/04/2021 1311   ALBUMIN 4.4 05/04/2021 1311   AST 36 05/04/2021 1311   ALT 36 05/04/2021 1311   ALKPHOS 77 05/04/2021 1311   BILITOT 0.4 05/04/2021 1311   GFRNONAA 43 (L) 09/18/2020 1212   GFRAA 50 (L) 09/18/2020 1212   Lab Results  Component Value Date   WBC 7.5 05/04/2021   HGB 14.3 05/04/2021   HCT 42.6 05/04/2021   MCV 93 05/04/2021   PLT 229 05/04/2021    Imaging Studies: Dotatate scan showed no signs of tracer avid gastrointestinal lesion.  No tracer avid lymph nodes or signs of solid organ metastasis.  Diffuse uptake was seen within the prostate gland that was reported to be nonspecific.  Hiatal hernia was seen.  PSA normal  Assessment and Plan:   KALEP FULL is a 87 y.o. y/o male here for follow-up of intermittent diarrhea and flushing  Given persistently elevated chromogranin A and no other obvious explanation for this elevation (such as hyperthyroidism) and increased uptake in the prostate gland, would recommend further evaluation by oncology  Looking at other causes of his symptoms, patient is on lisinopril, which can sometimes cause flushing and diarrhea, but it would not be expected to be intermittent.  In addition, it would not explain his increased chromogranin A.  However, given ongoing symptoms, I have reached out to primary care provider about this as well and await their response via epic messaging  Previous labs reviewed and he does not have hypercalcemia to suggest hyperthyroidism as cause of increased chromogranin A either  I had a detailed discussion with patient and his wife about all this and they are agreeable to oncology referral to obtain their opinion  They have scheduled to see the urologist in regard to findings on the  prostate gland on dotatate scan  Since his episodes have not reoccurred since March, perhaps it was more diarrhea that was causing his flushing, since it has improved with Metamucil and regulating his bowel movements better.  However, it still would not explain the elevated chromogranin a  Of note, his CT abdomen in 2020 showed a possible area of chronic scarring in his lung and this was forwarded to his PCP Dr. Natale Milch at that time, and as per notes in his chart, Dr. Natale Milch did see this.  The dotatate scan does not show any activity in the lung.  Oncology may choose to obtain a CT chest to further evaluate this as well  Continue follow-up in clinic to reassess symptoms and further work-up after urology and oncology assessment  Of note, patient does have a hiatal hernia, but that she does not have any nausea or vomiting, or significant symptoms from this to indicate surgical referral at this time  Dr Vincent Koch

## 2021-05-15 ENCOUNTER — Ambulatory Visit (INDEPENDENT_AMBULATORY_CARE_PROVIDER_SITE_OTHER): Payer: Medicare Other | Admitting: Urology

## 2021-05-15 ENCOUNTER — Encounter: Payer: Self-pay | Admitting: Urology

## 2021-05-15 VITALS — BP 125/68 | HR 64 | Ht 67.99 in | Wt 161.0 lb

## 2021-05-15 DIAGNOSIS — N400 Enlarged prostate without lower urinary tract symptoms: Secondary | ICD-10-CM

## 2021-05-15 NOTE — Progress Notes (Signed)
05/15/2021 10:11 AM   Vincent Koch November 19, 1934 [id number]  Referring provider: Charlynne Cousins, MD [address] [place],  [place] [address]  Chief Complaint  Patient presents with   Other    HPI: Vincent Koch is an 87 y.o. male referred for evaluation of BPH.  Had a Dotatate scan for evaluation of neuroendocrine GI neoplasm and incidentally noted to have diffuse uptake in the prostate No bothersome LUTS Denies prior history of urologic problems Denies dysuria or gross hematuria No flank, abdominal or pelvic pain PSA 05/04/2021 was 3.1 with a percent free PSA of 34.5   PMH: Past Medical History:  Diagnosis Date   Bradycardia    Chronic kidney disease    GERD (gastroesophageal reflux disease)    Hypertension    Neuropathy    toes   Osteoporosis    Peripheral neuropathy    Thyroid disease     Surgical History: Past Surgical History:  Procedure Laterality Date   CATARACT EXTRACTION W/PHACO Right 06/27/2019   Procedure: CATARACT EXTRACTION PHACO AND INTRAOCULAR LENS PLACEMENT ([facility])  RIGHT;  Surgeon: Leandrew Koyanagi, MD;  Location: [facility];  Service: Ophthalmology;  Laterality: Right;  VISION BLUE   COLONOSCOPY WITH PROPOFOL N/A 12/11/2018   Procedure: COLONOSCOPY WITH PROPOFOL;  Surgeon: Virgel Manifold, MD;  Location: [facility] ENDOSCOPY;  Service: Endoscopy;  Laterality: N/A;   ESOPHAGEAL DILATION N/A 06/14/2016   Procedure: ESOPHAGOGASTRODUODENOSCOPY (EGD) WITH PROPOFOL with dilation;  Surgeon: Lucilla Lame, MD;  Location: [facility];  Service: Endoscopy;  Laterality: N/A;   ESOPHAGOGASTRODUODENOSCOPY (EGD) WITH PROPOFOL N/A 07/15/2016   Procedure: ESOPHAGOGASTRODUODENOSCOPY (EGD) WITH DILATION;  Surgeon: Lucilla Lame, MD;  Location: [facility];  Service: Endoscopy;  Laterality: N/A;   ESOPHAGOGASTRODUODENOSCOPY (EGD) WITH PROPOFOL N/A 12/11/2018   Procedure: ESOPHAGOGASTRODUODENOSCOPY (EGD) WITH PROPOFOL;  Surgeon: Virgel Manifold, MD;   Location: [facility] ENDOSCOPY;  Service: Endoscopy;  Laterality: N/A;   HERNIA REPAIR     PARATHYROIDECTOMY      Home Medications:  Allergies as of 05/15/2021   No Known Allergies      Medication List        Accurate as of May 15, 2021 10:11 AM. If you have any questions, ask your nurse or doctor.          acetaminophen 500 MG tablet Commonly known as: TYLENOL Take 500 mg by mouth every 6 (six) hours as needed.   aspirin EC 81 MG tablet Take 81 mg by mouth once.   calcium-vitamin D 500-200 MG-UNIT tablet Commonly known as: OSCAL WITH D Take 1 tablet by mouth.   fenofibrate 145 MG tablet Commonly known as: Tricor Take 1 tablet (145 mg total) by mouth daily.   fexofenadine 180 MG tablet Commonly known as: ALLEGRA Take 180 mg by mouth as needed for allergies.   levothyroxine 25 MCG tablet Commonly known as: SYNTHROID Take 1 tablet (25 mcg total) by mouth daily.   lisinopril 10 MG tablet Commonly known as: ZESTRIL Take 1 tablet (10 mg total) by mouth daily.   loperamide 2 MG tablet Commonly known as: IMODIUM A-D Take 2 mg by mouth 4 (four) times daily as needed for diarrhea or loose stools.   Multi-Vitamins Tabs Take by mouth daily.   pantoprazole 40 MG tablet Commonly known as: PROTONIX Take 1 tablet (40 mg total) by mouth daily.   vitamin B-12 500 MCG tablet Commonly known as: CYANOCOBALAMIN Take 500 mcg by mouth daily.        Allergies:  No Known Allergies  Family History: Family History  Problem Relation Age of Onset   Stroke Mother    Throat cancer Father     Social History:  reports that he has never smoked. He has never used smokeless tobacco. He reports that he does not drink alcohol and does not use drugs.   Physical Exam: BP 125/68   Pulse 64   Ht 5' 7.99" (1.727 m)   Wt 161 lb (73 kg)   BMI 24.49 kg/m   Constitutional:  Alert and oriented, No acute distress. HEENT: [employer] AT, moist mucus membranes.  Trachea midline, no  masses. Cardiovascular: No clubbing, cyanosis, or edema. Respiratory: Normal respiratory effort, no increased work of breathing. GI: Abdomen is soft, nontender, nondistended, no abdominal masses GU: Prostate 50 g, smooth without nodules Skin: No rashes, bruises or suspicious lesions. Neurologic: Grossly intact, no focal deficits, moving all 4 extremities. Psychiatric: Normal mood and affect.   Assessment & Plan:    1.  BPH without LUTS Benign DRE and normal PSA; percentage free PSA supports benign disease PET scan findings indicative of BPH No further evaluation needed and would not recommend PSA monitoring Follow-up prn   Abbie Sons, MD  [facility] [address], [place], [place] [address] [phone]

## 2021-05-26 ENCOUNTER — Inpatient Hospital Stay: Payer: Medicare Other

## 2021-05-26 ENCOUNTER — Inpatient Hospital Stay: Payer: Medicare Other | Attending: Oncology | Admitting: Oncology

## 2021-05-26 ENCOUNTER — Encounter: Payer: Self-pay | Admitting: Oncology

## 2021-05-26 VITALS — BP 105/55 | HR 68 | Temp 98.2°F | Resp 18 | Wt 162.0 lb

## 2021-05-26 DIAGNOSIS — Z79899 Other long term (current) drug therapy: Secondary | ICD-10-CM | POA: Diagnosis not present

## 2021-05-26 DIAGNOSIS — R911 Solitary pulmonary nodule: Secondary | ICD-10-CM

## 2021-05-26 DIAGNOSIS — R799 Abnormal finding of blood chemistry, unspecified: Secondary | ICD-10-CM | POA: Diagnosis not present

## 2021-05-26 DIAGNOSIS — E039 Hypothyroidism, unspecified: Secondary | ICD-10-CM

## 2021-05-26 DIAGNOSIS — Z808 Family history of malignant neoplasm of other organs or systems: Secondary | ICD-10-CM

## 2021-05-26 DIAGNOSIS — K219 Gastro-esophageal reflux disease without esophagitis: Secondary | ICD-10-CM | POA: Diagnosis not present

## 2021-05-26 DIAGNOSIS — N189 Chronic kidney disease, unspecified: Secondary | ICD-10-CM

## 2021-05-26 NOTE — Progress Notes (Signed)
Hematology/Oncology Consult note [facility] [phone] [phone]   Patient Care Team: Charlynne Cousins, MD as PCP - General (Internal Medicine) Lucilla Lame, MD as Consulting Physician (Gastroenterology)  REFERRING PROVIDER: Virgel Manifold, MD  CHIEF COMPLAINTS/REASON FOR VISIT:  Evaluation of elevated of chromogranin A  HISTORY OF PRESENTING ILLNESS:   Vincent Koch is a  87 y.o.  male with PMH listed below was seen in consultation at the request of  Virgel Manifold, MD  for evaluation of elevated of chromogranin A  Patient was seen by gastroenterology about 2 years ago for evaluation of episodic diarrhea and flushing.  At that time, patient reports feeling hot suddenly and needs to cool off which usually followed by multiple loose bowel movements.  Patient had work-up with gastroenterology and had EGD and colonoscopy in 2020.   12/11/2018 colonoscopy showed 5 mg polyp in the transverse colon.  Removed.-Pathology showed tubular adenoma. Diverticulosis in the sigmoid colon. EGD showed normal esophagus and stomach, biopsied.  Hiatal hernia. a few gastric polyps biopsied.  Duodenal mucosa lymphangiectasia.  Mucosal changes in the duodenum, biopsied.  Biopsies were taken in the gastric body, incisura and in the gastric antrum.  Biopsies negative for malignancy or high-grade dysplasia.  12/28/2018 chromogranin A level was elevated for 404.8 [0-101.8]. 01/01/2019 5-HIAA was normal. 01/05/2019, CT abdomen pelvis showed no acute or inflammatory process identified in the Or pelvis.  Diverticulosis.  Hiatal hernia.  Lingula 12 to 14 mm lung nodule. Patient was recommended to repeat CT 3 months after this CT and he declined.  04/01/2021 patient followed up with gastroenterology.  His diarrhea/flushing symptoms have much improved. Patient recalls that the last episode was in March 2022.  He has not had any additional episodes since then.  He was  using Metamucil which seems to have helped his symptoms. Repeat chromogranin A showed persistent elevation at 440.  Patient was referred to hematology oncology for further evaluation. 04/22/2021, dotatate PET scan was obtained.  No signs of tracer avid gastrointestinal lesion.  No tracer avid lymph nodes or signs of solid organ metastasis.  Diffuse uptake within the prostate gland, nonspecific.  Aortic atherosclerosis.  Hiatal hernia.  Patient is on Protonix for acid reflux.  Also has chronic kidney disease.  Creatinine at baseline is 1.5. Patient has a history of hypothyroidism, was started on Synthroid recently. Today he was accompanied by his wife.  Review of Systems  Constitutional:  Negative for appetite change, chills, fatigue, fever and unexpected weight change.  HENT:   Negative for hearing loss and voice change.   Eyes:  Negative for eye problems and icterus.  Respiratory:  Negative for chest tightness, cough and shortness of breath.   Cardiovascular:  Negative for chest pain and leg swelling.  Gastrointestinal:  Negative for abdominal distention and abdominal pain.  Endocrine: Negative for hot flashes.  Genitourinary:  Negative for difficulty urinating, dysuria and frequency.   Musculoskeletal:  Negative for arthralgias.  Skin:  Negative for itching and rash.  Neurological:  Negative for light-headedness and numbness.  Hematological:  Negative for adenopathy. Does not bruise/bleed easily.  Psychiatric/Behavioral:  Negative for confusion.    MEDICAL HISTORY:  Past Medical History:  Diagnosis Date   Bradycardia    Chronic kidney disease    GERD (gastroesophageal reflux disease)    Hyperlipemia    Hypertension    Neuropathy    toes   Osteoporosis    Peripheral neuropathy    Thyroid disease  SURGICAL HISTORY: Past Surgical History:  Procedure Laterality Date   CATARACT EXTRACTION W/PHACO Right 06/27/2019   Procedure: CATARACT EXTRACTION PHACO AND INTRAOCULAR LENS  PLACEMENT ([place])  RIGHT;  Surgeon: Leandrew Koyanagi, MD;  Location: [place];  Service: Ophthalmology;  Laterality: Right;  VISION BLUE   COLONOSCOPY WITH PROPOFOL N/A 12/11/2018   Procedure: COLONOSCOPY WITH PROPOFOL;  Surgeon: Virgel Manifold, MD;  Location: [facility];  Service: Endoscopy;  Laterality: N/A;   ESOPHAGEAL DILATION N/A 06/14/2016   Procedure: ESOPHAGOGASTRODUODENOSCOPY (EGD) WITH PROPOFOL with dilation;  Surgeon: Lucilla Lame, MD;  Location: [place];  Service: Endoscopy;  Laterality: N/A;   ESOPHAGOGASTRODUODENOSCOPY (EGD) WITH PROPOFOL N/A 07/15/2016   Procedure: ESOPHAGOGASTRODUODENOSCOPY (EGD) WITH DILATION;  Surgeon: Lucilla Lame, MD;  Location: [place];  Service: Endoscopy;  Laterality: N/A;   ESOPHAGOGASTRODUODENOSCOPY (EGD) WITH PROPOFOL N/A 12/11/2018   Procedure: ESOPHAGOGASTRODUODENOSCOPY (EGD) WITH PROPOFOL;  Surgeon: Virgel Manifold, MD;  Location: [facility];  Service: Endoscopy;  Laterality: N/A;   HERNIA REPAIR     PARATHYROIDECTOMY      SOCIAL HISTORY: Social History   Socioeconomic History   Marital status: Married    Spouse name: Not on file   Number of children: Not on file   Years of education: Not on file   Highest education level: Master's degree (e.g., MA, MS, MEng, MEd, MSW, MBA)  Occupational History   Occupation: retired  Tobacco Use   Smoking status: Never   Smokeless tobacco: Never  Vaping Use   Vaping Use: Never used  Substance and Sexual Activity   Alcohol use: Never   Drug use: Never   Sexual activity: Yes  Other Topics Concern   Not on file  Social History Narrative   Not on file   Social Determinants of Health   Financial Resource Strain: Low Risk    Difficulty of Paying Living Expenses: Not hard at all  Food Insecurity: No Food Insecurity   Worried About Charity fundraiser in the Last Year: Never true   [place] in the Last Year: Never true  Transportation Needs:  No Transportation Needs   Lack of Transportation (Medical): No   Lack of Transportation (Non-Medical): No  Physical Activity: Inactive   Days of Exercise per Week: 0 days   Minutes of Exercise per Session: 0 min  Stress: No Stress Concern Present   Feeling of Stress : Not at all  Social Connections: Not on file  Intimate Partner Violence: Not on file    FAMILY HISTORY: Family History  Problem Relation Age of Onset   Stroke Mother    Throat cancer Father     ALLERGIES:  has No Known Allergies.  MEDICATIONS:  Current Outpatient Medications  Medication Sig Dispense Refill   acetaminophen (TYLENOL) 500 MG tablet Take 500 mg by mouth every 6 (six) hours as needed.     aspirin EC 81 MG tablet Take 81 mg by mouth once.      calcium-vitamin D (OSCAL WITH D) 500-200 MG-UNIT tablet Take 1 tablet by mouth.     cyanocobalamin 500 MCG tablet Take 500 mcg by mouth daily.     fenofibrate (TRICOR) 145 MG tablet Take 1 tablet (145 mg total) by mouth daily. 30 tablet 4   fexofenadine (ALLEGRA) 180 MG tablet Take 180 mg by mouth as needed for allergies.      levothyroxine (SYNTHROID) 25 MCG tablet Take 1 tablet (25 mcg total) by mouth daily. 30 tablet 2  lisinopril (ZESTRIL) 10 MG tablet Take 1 tablet (10 mg total) by mouth daily. 90 tablet 4   loperamide (IMODIUM A-D) 2 MG tablet Take 2 mg by mouth 4 (four) times daily as needed for diarrhea or loose stools.     Multiple Vitamin (MULTI-VITAMINS) TABS Take by mouth daily.      pantoprazole (PROTONIX) 40 MG tablet Take 1 tablet (40 mg total) by mouth daily. 90 tablet 4   No current facility-administered medications for this visit.     PHYSICAL EXAMINATION: ECOG PERFORMANCE STATUS: 1 - Symptomatic but completely ambulatory Vitals:   05/26/21 1516  BP: (!) 105/55  Pulse: 68  Resp: 18  Temp: 98.2 F (36.8 C)  SpO2: 97%   Filed Weights   05/26/21 1516  Weight: 162 lb (73.5 kg)    Physical Exam Constitutional:      General: He is  not in acute distress. HENT:     Head: Normocephalic and atraumatic.  Eyes:     General: No scleral icterus. Cardiovascular:     Rate and Rhythm: Normal rate and regular rhythm.     Heart sounds: Normal heart sounds.  Pulmonary:     Effort: Pulmonary effort is normal. No respiratory distress.     Breath sounds: No wheezing.  Abdominal:     General: Bowel sounds are normal. There is no distension.     Palpations: Abdomen is soft.  Musculoskeletal:        General: No deformity. Normal range of motion.     Cervical back: Normal range of motion and neck supple.  Skin:    General: Skin is warm and dry.     Findings: No erythema or rash.  Neurological:     Mental Status: He is alert and oriented to person, place, and time. Mental status is at baseline.     Cranial Nerves: No cranial nerve deficit.     Coordination: Coordination normal.  Psychiatric:        Mood and Affect: Mood normal.    LABORATORY DATA:  I have reviewed the data as listed Lab Results  Component Value Date   WBC 7.5 05/04/2021   HGB 14.3 05/04/2021   HCT 42.6 05/04/2021   MCV 93 05/04/2021   PLT 229 05/04/2021   Recent Labs    09/18/20 1212 03/16/21 1341 05/04/21 1311 05/11/21 1334  NA 139 138 136 139  K 4.8 4.4 4.6 4.5  CL 101 101 99 102  CO2 16* 20 21 19*  GLUCOSE 136* 83 86 88  BUN 13 17 19 20   CREATININE 1.48* 1.53* 1.55* 1.55*  CALCIUM 9.7 8.9 8.4* 9.8  GFRNONAA 43*  --   --   --   GFRAA 50*  --   --   --   PROT  --  7.0 6.9  --   ALBUMIN  --  4.4 4.4  --   AST  --  33 36  --   ALT  --  33 36  --   ALKPHOS  --  75 77  --   BILITOT  --  0.4 0.4  --    Iron/TIBC/Ferritin/ %Sat No results found for: IRON, TIBC, FERRITIN, IRONPCTSAT    RADIOGRAPHIC STUDIES: I have personally reviewed the radiological images as listed and agreed with the findings in the report. NM PET (NETSPOT GA 47 DOTATATE) SKULL BASE TO MID THIGH  Result Date: 04/27/2021 CLINICAL DATA:  Evaluate for neuroendocrine  receptor avid gastrointestinal neoplasm. Carcinoid syndrome. EXAM: NUCLEAR  MEDICINE PET SKULL BASE TO THIGH TECHNIQUE: 4.35 mCi Ga 48 DOTATATE was injected intravenously. Full-ring PET imaging was performed from the skull base to thigh after the radiotracer. CT data was obtained and used for attenuation correction and anatomic localization. COMPARISON:  None. FINDINGS: NECK No radiotracer activity in neck lymph nodes. Incidental CT findings: None CHEST No radiotracer accumulation within mediastinal or hilar lymph nodes. No suspicious pulmonary nodules on the CT scan. Incidental CT finding:Moderate size hiatal hernia. Aortic atherosclerosis. ABDOMEN/PELVIS No tracer avid lymph nodes identified within the abdomen or pelvis. There is diffuse increased uptake within the prostate gland is noted within SUV max of 9.96 (compared with SUV max within the non neoplastic liver is equal to 10.19.) Physiologic activity noted in the liver, spleen, adrenal glands and kidneys. Incidental CT findings:None SKELETON No focal activity to suggest skeletal metastasis. Incidental CT findings:None IMPRESSION: 1. No signs of tracer avid gastrointestinal lesion. No tracer avid lymph nodes or signs of solid organ metastasis. 2. Diffuse uptake within the prostate gland, nonspecific. This may be seen with hypertrophic and hyperplastic prostate gland as well as prostate cancer. Clinical correlation advised. 3.  Aortic Atherosclerosis (ICD10-I70.0). 4. Hiatal hernia Electronically Signed   By: Kerby Moors M.D.   On: 04/27/2021 08:37       ASSESSMENT & PLAN:  1. Abnormal blood chemistry test   2. Lung nodule    # Elevated chromogranin A level is non specific, can be seen in neuroendocrine carcinoma, as well as multiple other conditions, ie, IBS, chronic liver or kidney disease, CHF, PPI use, hyperthyroidism, other types of cancers.  DOTATATE PET did not show any tracer avid lesions.  Patient has CKD and also is on PPI, which may  contribute to his elevated levels.   I will hold off additional work up at this point.   # Lung nodule on previous CT No suspicious lung nodule on recent Dotatate PET, I recommend him to get a dedicated CT scan of his lung for follow up . Patient declined.  All questions were answered. The patient knows to call the clinic with any problems questions or concerns.  cc Virgel Manifold, MD  Thank you for this kind referral and the opportunity to participate in the care of this patient. A copy of today's note is routed to referring provider    Earlie Server, MD, PhD Hematology Oncology [facility] at [facility] Pager- [phone] 05/26/2021

## 2021-05-26 NOTE — Progress Notes (Signed)
Patient here for initial oncology appointment, expresses no complaints or concerns at this time.   

## 2021-05-27 ENCOUNTER — Other Ambulatory Visit: Payer: Self-pay

## 2021-05-27 ENCOUNTER — Ambulatory Visit
Admission: RE | Admit: 2021-05-27 | Discharge: 2021-05-27 | Disposition: A | Discharge status: Home / Self Care | Payer: Medicare Other | Source: Ambulatory Visit | Attending: Internal Medicine | Admitting: Internal Medicine

## 2021-05-27 DIAGNOSIS — E039 Hypothyroidism, unspecified: Secondary | ICD-10-CM | POA: Insufficient documentation

## 2021-05-27 DIAGNOSIS — R7989 Other specified abnormal findings of blood chemistry: Secondary | ICD-10-CM | POA: Insufficient documentation

## 2021-05-27 DIAGNOSIS — E042 Nontoxic multinodular goiter: Secondary | ICD-10-CM | POA: Diagnosis not present

## 2021-05-27 NOTE — Progress Notes (Signed)
Please let pt know this was normal. Ihas a nodule but does not meet criteria for biopsy or dedicated follow-up, pl let him know thnx.

## 2021-06-02 ENCOUNTER — Other Ambulatory Visit: Payer: Self-pay

## 2021-06-02 ENCOUNTER — Encounter: Payer: Self-pay | Admitting: Internal Medicine

## 2021-06-02 ENCOUNTER — Ambulatory Visit (INDEPENDENT_AMBULATORY_CARE_PROVIDER_SITE_OTHER): Payer: Medicare Other | Admitting: Internal Medicine

## 2021-06-02 VITALS — BP 106/63 | HR 50 | Temp 97.6°F | Ht 67.99 in | Wt 163.2 lb

## 2021-06-02 DIAGNOSIS — E785 Hyperlipidemia, unspecified: Secondary | ICD-10-CM

## 2021-06-02 DIAGNOSIS — I10 Essential (primary) hypertension: Secondary | ICD-10-CM

## 2021-06-02 DIAGNOSIS — N1831 Chronic kidney disease, stage 3a: Secondary | ICD-10-CM

## 2021-06-02 DIAGNOSIS — E210 Primary hyperparathyroidism: Secondary | ICD-10-CM | POA: Diagnosis not present

## 2021-06-02 DIAGNOSIS — R7989 Other specified abnormal findings of blood chemistry: Secondary | ICD-10-CM

## 2021-06-02 NOTE — Progress Notes (Signed)
BP 106/63   Pulse (!) 50   Temp 97.6 F (36.4 C) (Oral)   Ht 5' 7.99" (1.727 m)   Wt 163 lb 3.2 oz (74 kg)   BMI 24.82 kg/m    Subjective:    Patient ID: Vincent Koch, male    DOB: Apr 13, 1934, 87 y.o.   MRN: [id number]  Chief Complaint  Patient presents with   Thyroid Problem    Would like to follow up on imaging.    HPI: Vincent Koch is a 87 y.o. male  Thyroid Problem Patient reports no cold intolerance, constipation, diarrhea, fatigue, heat intolerance, palpitations or tremors.   Chief Complaint  Patient presents with   Thyroid Problem    Would like to follow up on imaging.    Relevant past medical, surgical, family and social history reviewed and updated as indicated. Interim medical history since our last visit reviewed. Allergies and medications reviewed and updated.  Review of Systems  Constitutional:  Negative for activity change, appetite change, chills, fatigue and fever.  HENT:  Negative for congestion, ear discharge, ear pain and facial swelling.   Eyes:  Negative for pain, discharge and itching.  Respiratory:  Negative for cough, chest tightness, shortness of breath and wheezing.   Cardiovascular:  Negative for chest pain, palpitations and leg swelling.  Gastrointestinal:  Negative for abdominal distention, abdominal pain, blood in stool, constipation, diarrhea, nausea and vomiting.  Endocrine: Negative for cold intolerance, heat intolerance, polydipsia, polyphagia and polyuria.  Genitourinary:  Negative for difficulty urinating, dysuria, flank pain, frequency, hematuria and urgency.  Musculoskeletal:  Negative for arthralgias, gait problem, joint swelling and myalgias.  Skin:  Negative for color change, rash and wound.  Neurological:  Negative for dizziness, tremors, speech difficulty, weakness, light-headedness, numbness and headaches.  Hematological:  Does not bruise/bleed easily.  Psychiatric/Behavioral:  Negative for agitation, confusion,  decreased concentration, sleep disturbance and suicidal ideas.    Per HPI unless specifically indicated above     Objective:    BP 106/63   Pulse (!) 50   Temp 97.6 F (36.4 C) (Oral)   Ht 5' 7.99" (1.727 m)   Wt 163 lb 3.2 oz (74 kg)   BMI 24.82 kg/m   Wt Readings from Last 3 Encounters:  06/02/21 163 lb 3.2 oz (74 kg)  05/26/21 162 lb (73.5 kg)  05/15/21 161 lb (73 kg)    Physical Exam Vitals and nursing note reviewed.  Constitutional:      General: He is not in acute distress.    Appearance: Normal appearance. He is not ill-appearing or diaphoretic.  HENT:     Head: Normocephalic and atraumatic.     Right Ear: Tympanic membrane and external ear normal. There is no impacted cerumen.     Left Ear: External ear normal.     Nose: No congestion or rhinorrhea.     Mouth/Throat:     Pharynx: No oropharyngeal exudate or posterior oropharyngeal erythema.  Eyes:     Conjunctiva/sclera: Conjunctivae normal.     Pupils: Pupils are equal, round, and reactive to light.  Cardiovascular:     Rate and Rhythm: Normal rate and regular rhythm.     Heart sounds: No murmur heard.   No friction rub. No gallop.  Pulmonary:     Effort: No respiratory distress.     Breath sounds: No stridor. No wheezing or rhonchi.  Chest:     Chest wall: No tenderness.  Abdominal:     General: Abdomen is  flat. Bowel sounds are normal.     Palpations: Abdomen is soft. There is no mass.     Tenderness: There is no abdominal tenderness.  Musculoskeletal:     Cervical back: Normal range of motion and neck supple. No rigidity or tenderness.     Left lower leg: No edema.  Skin:    General: Skin is warm.  Neurological:     Mental Status: He is alert.    Results for orders placed or performed in visit on 05/11/21  TSH  Result Value Ref Range   TSH 4.880 (H) 0.450 - 4.500 uIU/mL  T4, free  Result Value Ref Range   Free T4 0.78 (L) 0.82 - 1.77 ng/dL  Basic metabolic panel  Result Value Ref Range    Glucose 88 65 - 99 mg/dL   BUN 20 8 - 27 mg/dL   Creatinine, Ser 1.55 (H) 0.76 - 1.27 mg/dL   eGFR 44 (L) >59 mL/min/1.73   BUN/Creatinine Ratio 13 10 - 24   Sodium 139 134 - 144 mmol/L   Potassium 4.5 3.5 - 5.2 mmol/L   Chloride 102 96 - 106 mmol/L   CO2 19 (L) 20 - 29 mmol/L   Calcium 9.8 8.6 - 10.2 mg/dL        Current Outpatient Medications:    acetaminophen (TYLENOL) 500 MG tablet, Take 500 mg by mouth every 6 (six) hours as needed., Disp: , Rfl:    aspirin EC 81 MG tablet, Take 81 mg by mouth once. , Disp: , Rfl:    calcium-vitamin D (OSCAL WITH D) 500-200 MG-UNIT tablet, Take 1 tablet by mouth., Disp: , Rfl:    cyanocobalamin 500 MCG tablet, Take 500 mcg by mouth daily., Disp: , Rfl:    fenofibrate (TRICOR) 145 MG tablet, Take 1 tablet (145 mg total) by mouth daily., Disp: 30 tablet, Rfl: 4   fexofenadine (ALLEGRA) 180 MG tablet, Take 180 mg by mouth as needed for allergies. , Disp: , Rfl:    levothyroxine (SYNTHROID) 25 MCG tablet, Take 1 tablet (25 mcg total) by mouth daily., Disp: 30 tablet, Rfl: 2   lisinopril (ZESTRIL) 10 MG tablet, Take 1 tablet (10 mg total) by mouth daily., Disp: 90 tablet, Rfl: 4   loperamide (IMODIUM A-D) 2 MG tablet, Take 2 mg by mouth 4 (four) times daily as needed for diarrhea or loose stools., Disp: , Rfl:    Multiple Vitamin (MULTI-VITAMINS) TABS, Take by mouth daily. , Disp: , Rfl:    pantoprazole (PROTONIX) 40 MG tablet, Take 1 tablet (40 mg total) by mouth daily., Disp: 90 tablet, Rfl: 4    Assessment & Plan:  Hypothyroidism  had diarrhea/ flushing  Has more energy than last visit, hasnt been on it long enough   Ref. Range 05/04/2021 13:11 05/11/2021 13:34  TSH Latest Ref Range: 0.450 - 4.500 uIU/mL 6.930 (H) 4.880 (H)  T4,Free(Direct) Latest Ref Range: 0.82 - 1.77 ng/dL  0.78 (L)    2. HLD: is on fenofibrate  recheck FLP, check LFT's work on diet, SE of meds explained to pt. low fat and high fiber diet explained to pt.     Orders Placed  This Encounter  Procedures   CBC with Differential/Platelet   Thyroid Panel With TSH   Lipid panel   T4, free   CMP14+EGFR     No orders of the defined types were placed in this encounter.    Follow up plan: No follow-ups on file.

## 2021-08-03 ENCOUNTER — Other Ambulatory Visit: Payer: Self-pay | Admitting: Internal Medicine

## 2021-08-25 ENCOUNTER — Other Ambulatory Visit: Payer: Self-pay

## 2021-08-25 ENCOUNTER — Ambulatory Visit (INDEPENDENT_AMBULATORY_CARE_PROVIDER_SITE_OTHER): Payer: Medicare Other

## 2021-08-25 ENCOUNTER — Other Ambulatory Visit: Payer: Medicare Other

## 2021-08-25 DIAGNOSIS — Z23 Encounter for immunization: Secondary | ICD-10-CM | POA: Diagnosis not present

## 2021-08-25 DIAGNOSIS — E785 Hyperlipidemia, unspecified: Secondary | ICD-10-CM | POA: Diagnosis not present

## 2021-08-25 DIAGNOSIS — I10 Essential (primary) hypertension: Secondary | ICD-10-CM | POA: Diagnosis not present

## 2021-08-25 DIAGNOSIS — N1831 Chronic kidney disease, stage 3a: Secondary | ICD-10-CM | POA: Diagnosis not present

## 2021-08-25 DIAGNOSIS — R7989 Other specified abnormal findings of blood chemistry: Secondary | ICD-10-CM

## 2021-08-25 DIAGNOSIS — E210 Primary hyperparathyroidism: Secondary | ICD-10-CM | POA: Diagnosis not present

## 2021-08-26 LAB — CBC WITH DIFFERENTIAL/PLATELET
Basophils Absolute: 0.1 10*3/uL (ref 0.0–0.2)
Basos: 1 %
EOS (ABSOLUTE): 0.3 10*3/uL (ref 0.0–0.4)
Eos: 5 %
Hematocrit: 41.6 % (ref 37.5–51.0)
Hemoglobin: 13.6 g/dL (ref 13.0–17.7)
Immature Grans (Abs): 0 10*3/uL (ref 0.0–0.1)
Immature Granulocytes: 1 %
Lymphocytes Absolute: 1.6 10*3/uL (ref 0.7–3.1)
Lymphs: 25 %
MCH: 31.1 pg (ref 26.6–33.0)
MCHC: 32.7 g/dL (ref 31.5–35.7)
MCV: 95 fL (ref 79–97)
Monocytes Absolute: 0.6 10*3/uL (ref 0.1–0.9)
Monocytes: 9 %
Neutrophils Absolute: 3.8 10*3/uL (ref 1.4–7.0)
Neutrophils: 59 %
Platelets: 249 10*3/uL (ref 150–450)
RBC: 4.37 x10E6/uL (ref 4.14–5.80)
RDW: 13.2 % (ref 11.6–15.4)
WBC: 6.3 10*3/uL (ref 3.4–10.8)

## 2021-08-26 LAB — CMP14+EGFR
ALT: 33 IU/L (ref 0–44)
AST: 40 IU/L (ref 0–40)
Albumin/Globulin Ratio: 1.9 (ref 1.2–2.2)
Albumin: 4.4 g/dL (ref 3.6–4.6)
Alkaline Phosphatase: 60 IU/L (ref 44–121)
BUN/Creatinine Ratio: 13 (ref 10–24)
BUN: 23 mg/dL (ref 8–27)
Bilirubin Total: 0.6 mg/dL (ref 0.0–1.2)
CO2: 18 mmol/L — ABNORMAL LOW (ref 20–29)
Calcium: 9.6 mg/dL (ref 8.6–10.2)
Chloride: 101 mmol/L (ref 96–106)
Creatinine, Ser: 1.79 mg/dL — ABNORMAL HIGH (ref 0.76–1.27)
Globulin, Total: 2.3 g/dL (ref 1.5–4.5)
Glucose: 160 mg/dL — ABNORMAL HIGH (ref 70–99)
Potassium: 4.1 mmol/L (ref 3.5–5.2)
Sodium: 139 mmol/L (ref 134–144)
Total Protein: 6.7 g/dL (ref 6.0–8.5)
eGFR: 37 mL/min/{1.73_m2} — ABNORMAL LOW (ref >59)

## 2021-08-26 LAB — THYROID PANEL WITH TSH
Free Thyroxine Index: 1.5 (ref 1.2–4.9)
T3 Uptake Ratio: 31 % (ref 24–39)
T4, Total: 4.9 ug/dL (ref 4.5–12.0)
TSH: 3.76 u[IU]/mL (ref 0.450–4.500)

## 2021-08-26 LAB — LIPID PANEL
Chol/HDL Ratio: 4.7 ratio (ref 0.0–5.0)
Cholesterol, Total: 182 mg/dL (ref 100–199)
HDL: 39 mg/dL — ABNORMAL LOW (ref >39)
LDL Chol Calc (NIH): 107 mg/dL — ABNORMAL HIGH (ref 0–99)
Triglycerides: 204 mg/dL — ABNORMAL HIGH (ref 0–149)
VLDL Cholesterol Cal: 36 mg/dL (ref 5–40)

## 2021-08-26 LAB — T4, FREE: Free T4: 1.01 ng/dL (ref 0.82–1.77)

## 2021-08-27 ENCOUNTER — Ambulatory Visit: Payer: Medicare Other | Admitting: Gastroenterology

## 2021-09-03 ENCOUNTER — Encounter: Payer: Self-pay | Admitting: Internal Medicine

## 2021-09-03 ENCOUNTER — Other Ambulatory Visit: Payer: Self-pay

## 2021-09-03 ENCOUNTER — Ambulatory Visit (INDEPENDENT_AMBULATORY_CARE_PROVIDER_SITE_OTHER): Payer: Medicare Other | Admitting: Internal Medicine

## 2021-09-03 VITALS — BP 111/68 | HR 57 | Temp 97.8°F | Ht 67.99 in | Wt 165.2 lb

## 2021-09-03 DIAGNOSIS — R7309 Other abnormal glucose: Secondary | ICD-10-CM

## 2021-09-03 DIAGNOSIS — N1832 Chronic kidney disease, stage 3b: Secondary | ICD-10-CM | POA: Diagnosis not present

## 2021-09-03 DIAGNOSIS — E042 Nontoxic multinodular goiter: Secondary | ICD-10-CM

## 2021-09-03 DIAGNOSIS — E7849 Other hyperlipidemia: Secondary | ICD-10-CM | POA: Diagnosis not present

## 2021-09-03 LAB — BAYER DCA HB A1C WAIVED: HB A1C (BAYER DCA - WAIVED): 5.9 % — ABNORMAL HIGH (ref 4.8–5.6)

## 2021-09-03 MED ORDER — LEVOTHYROXINE SODIUM 25 MCG PO TABS: 25.0000 ug | ORAL_TABLET | Freq: Every day | ORAL | 2 refills | Status: DC

## 2021-09-03 MED ORDER — FENOFIBRATE 145 MG PO TABS: 145.0000 mg | ORAL_TABLET | Freq: Every day | ORAL | 4 refills | Status: DC

## 2021-09-03 NOTE — Progress Notes (Signed)
BP 111/68   Pulse (!) 57   Temp 97.8 F (36.6 C) (Oral)   Ht 5' 7.99" (1.727 m)   Wt 165 lb 3.2 oz (74.9 kg)   SpO2 98%   BMI 25.12 kg/m    Subjective:    Patient ID: Vincent Koch, male    DOB: Nov 15, 1934, 87 y.o.   MRN: [id number]  Chief Complaint  Patient presents with   Hypertension   Hyperlipidemia   Hyperparathyroid   Hypothyroidism   Chronic Kidney Disease    HPI: Vincent Koch is a 87 y.o. male  Hypertension This is a chronic problem. The current episode started more than 1 year ago. The problem has been gradually improving since onset. The problem is controlled. Pertinent negatives include no anxiety, blurred vision, chest pain, headaches, malaise/fatigue, neck pain, orthopnea, palpitations, peripheral edema, shortness of breath or sweats. Identifiable causes of hypertension include a thyroid problem.  Hyperlipidemia Pertinent negatives include no chest pain or shortness of breath.  Thyroid Problem Presents for follow-up visit. Patient reports no anxiety, cold intolerance, constipation, hoarse voice, leg swelling, menstrual problem or palpitations. His past medical history is significant for hyperlipidemia.   Chief Complaint  Patient presents with   Hypertension   Hyperlipidemia   Hyperparathyroid   Hypothyroidism   Chronic Kidney Disease    Relevant past medical, surgical, family and social history reviewed and updated as indicated. Interim medical history since our last visit reviewed. Allergies and medications reviewed and updated.  Review of Systems  Constitutional:  Negative for malaise/fatigue.  HENT:  Negative for hoarse voice.   Eyes:  Negative for blurred vision.  Respiratory:  Negative for shortness of breath.   Cardiovascular:  Negative for chest pain, palpitations and orthopnea.  Gastrointestinal:  Negative for constipation.  Endocrine: Negative for cold intolerance.  Genitourinary:  Negative for menstrual problem.   Musculoskeletal:  Negative for neck pain.  Neurological:  Negative for headaches.  Psychiatric/Behavioral:  The patient is not nervous/anxious.    Per HPI unless specifically indicated above     Objective:    BP 111/68   Pulse (!) 57   Temp 97.8 F (36.6 C) (Oral)   Ht 5' 7.99" (1.727 m)   Wt 165 lb 3.2 oz (74.9 kg)   SpO2 98%   BMI 25.12 kg/m   Wt Readings from Last 3 Encounters:  09/03/21 165 lb 3.2 oz (74.9 kg)  06/02/21 163 lb 3.2 oz (74 kg)  05/26/21 162 lb (73.5 kg)    Physical Exam Vitals and nursing note reviewed.  Constitutional:      General: He is not in acute distress.    Appearance: Normal appearance. He is not ill-appearing or diaphoretic.  HENT:     Head: Normocephalic and atraumatic.     Right Ear: Tympanic membrane and external ear normal. There is no impacted cerumen.     Left Ear: External ear normal.     Nose: No congestion or rhinorrhea.     Mouth/Throat:     Pharynx: No oropharyngeal exudate or posterior oropharyngeal erythema.  Eyes:     Conjunctiva/sclera: Conjunctivae normal.     Pupils: Pupils are equal, round, and reactive to light.  Cardiovascular:     Rate and Rhythm: Normal rate and regular rhythm.     Heart sounds: No murmur heard.   No friction rub. No gallop.  Pulmonary:     Effort: No respiratory distress.     Breath sounds: No stridor. No wheezing or rhonchi.  Chest:     Chest wall: No tenderness.  Abdominal:     General: Abdomen is flat. Bowel sounds are normal.     Palpations: Abdomen is soft. There is no mass.     Tenderness: There is no abdominal tenderness.  Musculoskeletal:     Cervical back: Normal range of motion and neck supple. No rigidity or tenderness.     Left lower leg: No edema.  Skin:    General: Skin is warm and dry.  Neurological:     Mental Status: He is alert.    Results for orders placed or performed in visit on 09/03/21  Bayer DCA Hb A1c Waived (STAT)  Result Value Ref Range   HB A1C (BAYER DCA -  WAIVED) 5.9 (H) 4.8 - 5.6 %        Current Outpatient Medications:    acetaminophen (TYLENOL) 500 MG tablet, Take 500 mg by mouth every 6 (six) hours as needed., Disp: , Rfl:    aspirin EC 81 MG tablet, Take 81 mg by mouth once. , Disp: , Rfl:    calcium-vitamin D (OSCAL WITH D) 500-200 MG-UNIT tablet, Take 1 tablet by mouth., Disp: , Rfl:    cyanocobalamin 500 MCG tablet, Take 500 mcg by mouth daily., Disp: , Rfl:    fexofenadine (ALLEGRA) 180 MG tablet, Take 180 mg by mouth as needed for allergies. , Disp: , Rfl:    lisinopril (ZESTRIL) 10 MG tablet, Take 1 tablet (10 mg total) by mouth daily., Disp: 90 tablet, Rfl: 4   loperamide (IMODIUM A-D) 2 MG tablet, Take 2 mg by mouth 4 (four) times daily as needed for diarrhea or loose stools., Disp: , Rfl:    Multiple Vitamin (MULTI-VITAMINS) TABS, Take by mouth daily. , Disp: , Rfl:    pantoprazole (PROTONIX) 40 MG tablet, Take 1 tablet (40 mg total) by mouth daily., Disp: 90 tablet, Rfl: 4   fenofibrate (TRICOR) 145 MG tablet, Take 1 tablet (145 mg total) by mouth daily., Disp: 90 tablet, Rfl: 4   levothyroxine (SYNTHROID) 25 MCG tablet, Take 1 tablet (25 mcg total) by mouth daily., Disp: 90 tablet, Rfl: 2    Assessment & Plan:  HLD: HLD recheck FLP, check LFT's work on diet, SE of meds explained to pt. low fat and high fiber diet explained to pt.  Ref. Range 05/04/2021 13:11 05/11/2021 13:34 05/27/2021 11:11 08/25/2021 10:22  Total CHOL/HDL Ratio Latest Ref Range: 0.0 - 5.0 ratio 5.4 (H)   4.7  Cholesterol, Total Latest Ref Range: 100 - 199 mg/dL 206 (H)   182  HDL Cholesterol Latest Ref Range: >39 mg/dL 38 (L)   39 (L)  Triglycerides Latest Ref Range: 0 - 149 mg/dL 292 (H)   204 (H)  VLDL Cholesterol Cal Latest Ref Range: 5 - 40 mg/dL 51 (H)   36  LDL Chol Calc (NIH) Latest Ref Range: 0 - 99 mg/dL 117 (H)   107 (H)   Hypothyrodisim  Much improved,  PLEASE TAKE YOUR THYROID MEDICATION FIRST THING IN THE MORNING WHILST FASTING.  NO  MEDICATION/ FOOD FOR AN HOUR AFTER INGESTING THYROID PILLS.  US thyroid : some nodules no bx indicated.  IMPRESSION: Mildly heterogeneous nonenlarged thyroid gland. 0.6 cm right inferior nodule does not meet criteria for biopsy or dedicated follow-up.  Ref. Range 05/04/2021 13:11 05/11/2021 13:34 05/27/2021 11:11 08/25/2021 10:22  TSH Latest Ref Range: 0.450 - 4.500 uIU/mL 6.930 (H) 4.880 (H)  3.760  T4,Free(Direct) Latest Ref Range: 0.82 - 1.77  ng/dL  0.78 (L)  1.01    Carcinoid syndrome: ? elevated of chromogranin A was seeing Dr. Tasia Catchings oncologist  To fu with them  No more diarrehea  ?unsure if this was sec to his thyroid issues.    Elevated Creatinine :    Ref. Range 05/04/2021 13:11 05/11/2021 13:34 08/25/2021 10:22  Creatinine Latest Ref Range: 0.76 - 1.27 mg/dL 1.55 (H) 1.55 (H) 1.79 (H)   Problem List Items Addressed This Visit       Genitourinary   CKD (chronic kidney disease) stage 3, GFR 30-59 ml/min (HCC) - Primary   Relevant Orders   Ambulatory referral to Nephrology   CBC with Differential/Platelet   Comprehensive metabolic panel   Lipid panel     Other   Elevated glucose level   Relevant Orders   Bayer DCA Hb A1c Waived (STAT) (Completed)   Other hyperlipidemia   Relevant Medications   fenofibrate (TRICOR) 145 MG tablet   Other Relevant Orders   Lipid panel     Orders Placed This Encounter  Procedures   Bayer DCA Hb A1c Waived (STAT)   CBC with Differential/Platelet   Comprehensive metabolic panel   Lipid panel   Ambulatory referral to Nephrology     Meds ordered this encounter  Medications   fenofibrate (TRICOR) 145 MG tablet    Sig: Take 1 tablet (145 mg total) by mouth daily.    Dispense:  90 tablet    Refill:  4   levothyroxine (SYNTHROID) 25 MCG tablet    Sig: Take 1 tablet (25 mcg total) by mouth daily.    Dispense:  90 tablet    Refill:  2     Follow up plan: Return in about 3 months (around 12/04/2021).

## 2021-09-03 NOTE — Progress Notes (Signed)
Please let pt know this was normal.

## 2021-09-04 ENCOUNTER — Telehealth: Payer: Self-pay

## 2021-09-04 NOTE — Telephone Encounter (Signed)
-----   Message from Charlynne Cousins, MD sent at 09/03/2021  2:06 PM EDT ----- Please let pt know this was normal.

## 2021-09-04 NOTE — Telephone Encounter (Signed)
Informed patient of results

## 2021-09-07 ENCOUNTER — Encounter: Payer: Self-pay | Admitting: Internal Medicine

## 2021-10-07 ENCOUNTER — Other Ambulatory Visit (HOSPITAL_COMMUNITY): Payer: Self-pay | Admitting: Nephrology

## 2021-10-07 ENCOUNTER — Other Ambulatory Visit: Payer: Self-pay | Admitting: Nephrology

## 2021-10-07 DIAGNOSIS — Z9089 Acquired absence of other organs: Secondary | ICD-10-CM | POA: Diagnosis not present

## 2021-10-07 DIAGNOSIS — N1832 Chronic kidney disease, stage 3b: Secondary | ICD-10-CM | POA: Diagnosis not present

## 2021-10-07 DIAGNOSIS — E785 Hyperlipidemia, unspecified: Secondary | ICD-10-CM | POA: Diagnosis not present

## 2021-10-07 DIAGNOSIS — N189 Chronic kidney disease, unspecified: Secondary | ICD-10-CM | POA: Diagnosis not present

## 2021-10-07 DIAGNOSIS — N400 Enlarged prostate without lower urinary tract symptoms: Secondary | ICD-10-CM | POA: Diagnosis not present

## 2021-10-07 DIAGNOSIS — N200 Calculus of kidney: Secondary | ICD-10-CM | POA: Diagnosis not present

## 2021-10-07 DIAGNOSIS — E210 Primary hyperparathyroidism: Secondary | ICD-10-CM | POA: Diagnosis not present

## 2021-10-07 DIAGNOSIS — E340 Carcinoid syndrome: Secondary | ICD-10-CM | POA: Diagnosis not present

## 2021-10-07 DIAGNOSIS — Z9889 Other specified postprocedural states: Secondary | ICD-10-CM | POA: Insufficient documentation

## 2021-10-07 DIAGNOSIS — K219 Gastro-esophageal reflux disease without esophagitis: Secondary | ICD-10-CM | POA: Diagnosis not present

## 2021-10-07 DIAGNOSIS — E892 Postprocedural hypoparathyroidism: Secondary | ICD-10-CM | POA: Insufficient documentation

## 2021-10-07 DIAGNOSIS — I10 Essential (primary) hypertension: Secondary | ICD-10-CM | POA: Diagnosis not present

## 2021-10-13 ENCOUNTER — Ambulatory Visit
Admission: RE | Admit: 2021-10-13 | Discharge: 2021-10-13 | Disposition: A | Discharge status: Home / Self Care | Payer: Medicare Other | Source: Ambulatory Visit | Attending: Nephrology | Admitting: Nephrology

## 2021-10-13 ENCOUNTER — Other Ambulatory Visit: Payer: Self-pay

## 2021-10-13 DIAGNOSIS — N200 Calculus of kidney: Secondary | ICD-10-CM | POA: Diagnosis not present

## 2021-10-13 DIAGNOSIS — N1832 Chronic kidney disease, stage 3b: Secondary | ICD-10-CM | POA: Diagnosis not present

## 2021-11-16 ENCOUNTER — Other Ambulatory Visit: Payer: Self-pay | Admitting: Nurse Practitioner

## 2021-11-16 DIAGNOSIS — I10 Essential (primary) hypertension: Secondary | ICD-10-CM

## 2021-11-17 ENCOUNTER — Other Ambulatory Visit: Payer: Self-pay

## 2021-11-17 NOTE — Telephone Encounter (Signed)
Requested Prescriptions  Pending Prescriptions Disp Refills   lisinopril (ZESTRIL) 10 MG tablet [Pharmacy Med Name: Lisinopril 10 MG Oral Tablet] 29 tablet 0    Sig: Take 1 tablet by mouth once daily     Cardiovascular:  ACE Inhibitors Failed - 11/16/2021  2:50 AM      Failed - Cr in normal range and within 180 days    Creatinine, Ser  Date Value Ref Range Status  08/25/2021 1.79 (H) 0.76 - 1.27 mg/dL Final         Passed - K in normal range and within 180 days    Potassium  Date Value Ref Range Status  08/25/2021 4.1 3.5 - 5.2 mmol/L Final         Passed - Patient is not pregnant      Passed - Last BP in normal range    BP Readings from Last 1 Encounters:  09/03/21 111/68         Passed - Valid encounter within last 6 months    Recent Outpatient Visits          2 months ago Stage 3b chronic kidney disease ([place])   [facility] Vigg, Avanti, MD   5 months ago Primary hypertension   [place] Vigg, Avanti, MD   6 months ago Elevated TSH   [place] Vigg, Avanti, MD   7 months ago Idiopathic peripheral neuropathy   [facility] Vigg, Avanti, MD   8 months ago Carcinoid syndrome ([place])   [place], Barbaraann Faster, NP      Future Appointments            In 3 weeks Vigg, Avanti, MD [facility], [facility]   In 3 months  [employer], [place]

## 2021-11-20 ENCOUNTER — Other Ambulatory Visit: Payer: Self-pay | Admitting: Nurse Practitioner

## 2021-11-20 DIAGNOSIS — I10 Essential (primary) hypertension: Secondary | ICD-10-CM

## 2021-11-20 NOTE — Telephone Encounter (Signed)
[place] called and spoke to [place], [facility] about the refill(s) lisinopril requested. Advised it was sent on 11/17/21 #90/0 refill(s). She states it was filled and pt hasn't p/up medication yet. She advised that pt is signed up for text alerts and will let him know again its ready.  Requested Prescriptions  Pending Prescriptions Disp Refills   lisinopril (ZESTRIL) 10 MG tablet [Pharmacy Med Name: Lisinopril 10 MG Oral Tablet] 90 tablet 0    Sig: Take 1 tablet by mouth once daily     Cardiovascular:  ACE Inhibitors Failed - 11/20/2021  1:41 PM      Failed - Cr in normal range and within 180 days    Creatinine, Ser  Date Value Ref Range Status  08/25/2021 1.79 (H) 0.76 - 1.27 mg/dL Final          Passed - K in normal range and within 180 days    Potassium  Date Value Ref Range Status  08/25/2021 4.1 3.5 - 5.2 mmol/L Final          Passed - Patient is not pregnant      Passed - Last BP in normal range    BP Readings from Last 1 Encounters:  09/03/21 111/68          Passed - Valid encounter within last 6 months    Recent Outpatient Visits           2 months ago Stage 3b chronic kidney disease ([place])   [facility] Vigg, Avanti, MD   5 months ago Primary hypertension   [place] Vigg, Avanti, MD   6 months ago Elevated TSH   [place] Vigg, Avanti, MD   7 months ago Idiopathic peripheral neuropathy   [facility] Vigg, Avanti, MD   8 months ago Carcinoid syndrome ([place])   [place], Barbaraann Faster, NP       Future Appointments             In 2 weeks Vigg, Avanti, MD [facility], [facility]   In 3 months  [employer], [place]

## 2021-12-08 ENCOUNTER — Ambulatory Visit (INDEPENDENT_AMBULATORY_CARE_PROVIDER_SITE_OTHER): Payer: Medicare Other | Admitting: Internal Medicine

## 2021-12-08 ENCOUNTER — Other Ambulatory Visit: Payer: Self-pay | Admitting: Nurse Practitioner

## 2021-12-08 ENCOUNTER — Other Ambulatory Visit: Payer: Self-pay

## 2021-12-08 ENCOUNTER — Encounter: Payer: Self-pay | Admitting: Internal Medicine

## 2021-12-08 VITALS — BP 96/59 | HR 54 | Temp 97.7°F | Ht 68.11 in | Wt 165.0 lb

## 2021-12-08 DIAGNOSIS — N1832 Chronic kidney disease, stage 3b: Secondary | ICD-10-CM

## 2021-12-08 DIAGNOSIS — E039 Hypothyroidism, unspecified: Secondary | ICD-10-CM

## 2021-12-08 DIAGNOSIS — E785 Hyperlipidemia, unspecified: Secondary | ICD-10-CM

## 2021-12-08 NOTE — Telephone Encounter (Signed)
Requested Prescriptions  Pending Prescriptions Disp Refills   pantoprazole (PROTONIX) 40 MG tablet [Pharmacy Med Name: Pantoprazole Sodium 40 MG Oral Tablet Delayed Release] 7 tablet 0    Sig: Take 1 tablet by mouth once daily     Gastroenterology: Proton Pump Inhibitors Passed - 12/08/2021  2:42 AM      Passed - Valid encounter within last 12 months    Recent Outpatient Visits          3 months ago Stage 3b chronic kidney disease ([place])   [facility] Vigg, Avanti, MD   6 months ago Primary hypertension   [place] Vigg, Avanti, MD   7 months ago Elevated TSH   [place] Vigg, Avanti, MD   7 months ago Idiopathic peripheral neuropathy   [facility] Vigg, Avanti, MD   8 months ago Carcinoid syndrome ([place])   [place], Barbaraann Faster, NP      Future Appointments            Today Vigg, Avanti, MD [facility], [facility]   In 2 months  [employer], [place]

## 2021-12-08 NOTE — Progress Notes (Signed)
BP (!) 96/59    Pulse (!) 54    Temp 97.7 F (36.5 C) (Oral)    Ht 5' 8.11" (1.73 m)    Wt 165 lb (74.8 kg)    SpO2 98%    BMI 25.01 kg/m    Subjective:    Patient ID: Vincent Koch, male    DOB: Apr 17, 1934, 88 y.o.   MRN: [id number]  Chief Complaint  Patient presents with   Hypertension   Hyperthyroidism   Chronic Kidney Disease    HPI: Vincent Koch is a 88 y.o. male  Hypertension This is a chronic problem. The current episode started more than 1 year ago. The problem has been waxing and waning since onset. Pertinent negatives include no anxiety, blurred vision, chest pain, headaches, malaise/fatigue, neck pain, orthopnea, palpitations, peripheral edema, PND, shortness of breath or sweats. Identifiable causes of hypertension include a thyroid problem.  Thyroid Problem Presents for follow-up visit. Patient reports no anxiety, cold intolerance, constipation, depressed mood, diaphoresis, diarrhea, dry skin, fatigue, hair loss, heat intolerance, hoarse voice, leg swelling, menstrual problem, nail problem, palpitations, tremors, visual change, weight gain or weight loss.   Chief Complaint  Patient presents with   Hypertension   Hyperthyroidism   Chronic Kidney Disease    Relevant past medical, surgical, family and social history reviewed and updated as indicated. Interim medical history since our last visit reviewed. Allergies and medications reviewed and updated.  Review of Systems  Constitutional:  Negative for diaphoresis, fatigue, malaise/fatigue, weight gain and weight loss.  HENT:  Negative for hoarse voice.   Eyes:  Negative for blurred vision.  Respiratory:  Negative for shortness of breath.   Cardiovascular:  Negative for chest pain, palpitations, orthopnea and PND.  Gastrointestinal:  Negative for constipation and diarrhea.  Endocrine: Negative for cold intolerance and heat intolerance.  Genitourinary:  Negative for menstrual problem.   Musculoskeletal:  Negative for neck pain.  Neurological:  Negative for tremors and headaches.  Psychiatric/Behavioral:  The patient is not nervous/anxious.    Per HPI unless specifically indicated above     Objective:    BP (!) 96/59    Pulse (!) 54    Temp 97.7 F (36.5 C) (Oral)    Ht 5' 8.11" (1.73 m)    Wt 165 lb (74.8 kg)    SpO2 98%    BMI 25.01 kg/m   Wt Readings from Last 3 Encounters:  12/08/21 165 lb (74.8 kg)  09/03/21 165 lb 3.2 oz (74.9 kg)  06/02/21 163 lb 3.2 oz (74 kg)    Physical Exam Vitals and nursing note reviewed.  Constitutional:      General: He is not in acute distress.    Appearance: Normal appearance. He is not ill-appearing or diaphoretic.  HENT:     Head: Normocephalic and atraumatic.     Right Ear: Tympanic membrane and external ear normal. There is no impacted cerumen.     Left Ear: External ear normal.     Nose: No congestion or rhinorrhea.     Mouth/Throat:     Pharynx: No oropharyngeal exudate or posterior oropharyngeal erythema.  Eyes:     Conjunctiva/sclera: Conjunctivae normal.     Pupils: Pupils are equal, round, and reactive to light.  Cardiovascular:     Rate and Rhythm: Normal rate and regular rhythm.     Heart sounds: No murmur heard.   No friction rub. No gallop.  Pulmonary:     Effort: No respiratory distress.  Breath sounds: No stridor. No wheezing or rhonchi.  Chest:     Chest wall: No tenderness.  Abdominal:     General: Abdomen is flat. Bowel sounds are normal.     Palpations: Abdomen is soft. There is no mass.     Tenderness: There is no abdominal tenderness.  Musculoskeletal:     Cervical back: Normal range of motion and neck supple. No rigidity or tenderness.     Left lower leg: No edema.  Skin:    General: Skin is warm and dry.  Neurological:     Mental Status: He is alert.  Psychiatric:        Mood and Affect: Mood normal.        Behavior: Behavior normal.    Results for orders placed or performed in  visit on 09/03/21  Bayer DCA Hb A1c Waived (STAT)  Result Value Ref Range   HB A1C (BAYER DCA - WAIVED) 5.9 (H) 4.8 - 5.6 %        Current Outpatient Medications:    acetaminophen (TYLENOL) 500 MG tablet, Take 500 mg by mouth every 6 (six) hours as needed., Disp: , Rfl:    aspirin EC 81 MG tablet, Take 81 mg by mouth once. , Disp: , Rfl:    calcium-vitamin D (OSCAL WITH D) 500-200 MG-UNIT tablet, Take 1 tablet by mouth., Disp: , Rfl:    cyanocobalamin 500 MCG tablet, Take 500 mcg by mouth daily., Disp: , Rfl:    fenofibrate (TRICOR) 145 MG tablet, Take 1 tablet (145 mg total) by mouth daily., Disp: 90 tablet, Rfl: 4   fexofenadine (ALLEGRA) 180 MG tablet, Take 180 mg by mouth as needed for allergies. , Disp: , Rfl:    levothyroxine (SYNTHROID) 25 MCG tablet, Take 1 tablet (25 mcg total) by mouth daily., Disp: 90 tablet, Rfl: 2   loperamide (IMODIUM A-D) 2 MG tablet, Take 2 mg by mouth 4 (four) times daily as needed for diarrhea or loose stools., Disp: , Rfl:    Multiple Vitamin (MULTI-VITAMINS) TABS, Take by mouth daily. , Disp: , Rfl:    pantoprazole (PROTONIX) 40 MG tablet, Take 1 tablet by mouth once daily, Disp: 90 tablet, Rfl: 2    Assessment & Plan:  HTN stop  lisinopril bp check x 2 weeks. Medication compliance emphasised. pt advised to keep Bp logs. Pt verbalised understanding of the same. Pt to have a low salt diet . Exercise to reach a goal of at least 150 mins a week.  lifestyle modifications explained and pt understands importance of the above. Under good control on current regimen. Continue current regimen. Continue to monitor. Call with any concerns. Refills given. Labs drawn today.   HLD recheck FLP, check LFT's work on diet, SE of meds explained to pt. low fat and high fiber diet  3. Hypothyroidism Chronic, ongoing with recent TSH within normal range.  Continue current medication regimen and adjust as needed.  Plan to recheck TSH at physical in 6 months.    PLEASE TAKE YOUR THYROID MEDICATION FIRST THING IN THE MORNING WHILST FASTING.  NO MEDICATION/ FOOD FOR AN HOUR AFTER INGESTING THYROID PILLS.   Problem List Items Addressed This Visit       Genitourinary   CKD (chronic kidney disease) stage 3, GFR 30-59 ml/min (HCC)     Other   Hyperlipemia   Relevant Orders   Lipid panel   Other Visit Diagnoses     Hypothyroidism, unspecified type    -  Primary   Relevant Orders   TSH   Lipid panel        Orders Placed This Encounter  Procedures   TSH   Lipid panel     No orders of the defined types were placed in this encounter.

## 2021-12-09 DIAGNOSIS — E340 Carcinoid syndrome: Secondary | ICD-10-CM | POA: Diagnosis not present

## 2021-12-09 DIAGNOSIS — N189 Chronic kidney disease, unspecified: Secondary | ICD-10-CM | POA: Diagnosis not present

## 2021-12-09 DIAGNOSIS — I10 Essential (primary) hypertension: Secondary | ICD-10-CM | POA: Diagnosis not present

## 2021-12-09 DIAGNOSIS — K219 Gastro-esophageal reflux disease without esophagitis: Secondary | ICD-10-CM | POA: Diagnosis not present

## 2021-12-09 DIAGNOSIS — N1832 Chronic kidney disease, stage 3b: Secondary | ICD-10-CM | POA: Diagnosis not present

## 2021-12-09 DIAGNOSIS — N400 Enlarged prostate without lower urinary tract symptoms: Secondary | ICD-10-CM | POA: Diagnosis not present

## 2021-12-09 DIAGNOSIS — Z9089 Acquired absence of other organs: Secondary | ICD-10-CM | POA: Diagnosis not present

## 2021-12-09 DIAGNOSIS — E785 Hyperlipidemia, unspecified: Secondary | ICD-10-CM | POA: Diagnosis not present

## 2021-12-09 DIAGNOSIS — E210 Primary hyperparathyroidism: Secondary | ICD-10-CM | POA: Diagnosis not present

## 2021-12-09 DIAGNOSIS — N200 Calculus of kidney: Secondary | ICD-10-CM | POA: Diagnosis not present

## 2021-12-09 LAB — CBC WITH DIFFERENTIAL/PLATELET
Basophils Absolute: 0.1 10*3/uL (ref 0.0–0.2)
Basos: 2 %
EOS (ABSOLUTE): 0.9 10*3/uL — ABNORMAL HIGH (ref 0.0–0.4)
Eos: 11 %
Hematocrit: 41.9 % (ref 37.5–51.0)
Hemoglobin: 13.8 g/dL (ref 13.0–17.7)
Immature Grans (Abs): 0 10*3/uL (ref 0.0–0.1)
Immature Granulocytes: 1 %
Lymphocytes Absolute: 1.7 10*3/uL (ref 0.7–3.1)
Lymphs: 22 %
MCH: 30.9 pg (ref 26.6–33.0)
MCHC: 32.9 g/dL (ref 31.5–35.7)
MCV: 94 fL (ref 79–97)
Monocytes Absolute: 0.8 10*3/uL (ref 0.1–0.9)
Monocytes: 10 %
Neutrophils Absolute: 4.1 10*3/uL (ref 1.4–7.0)
Neutrophils: 54 %
Platelets: 248 10*3/uL (ref 150–450)
RBC: 4.46 x10E6/uL (ref 4.14–5.80)
RDW: 13.1 % (ref 11.6–15.4)
WBC: 7.5 10*3/uL (ref 3.4–10.8)

## 2021-12-09 LAB — LIPID PANEL
Chol/HDL Ratio: 5.5 ratio — ABNORMAL HIGH (ref 0.0–5.0)
Cholesterol, Total: 205 mg/dL — ABNORMAL HIGH (ref 100–199)
HDL: 37 mg/dL — ABNORMAL LOW (ref >39)
LDL Chol Calc (NIH): 127 mg/dL — ABNORMAL HIGH (ref 0–99)
Triglycerides: 232 mg/dL — ABNORMAL HIGH (ref 0–149)
VLDL Cholesterol Cal: 41 mg/dL — ABNORMAL HIGH (ref 5–40)

## 2021-12-09 LAB — COMPREHENSIVE METABOLIC PANEL
ALT: 42 IU/L (ref 0–44)
AST: 45 IU/L — ABNORMAL HIGH (ref 0–40)
Albumin/Globulin Ratio: 1.8 (ref 1.2–2.2)
Albumin: 4.6 g/dL (ref 3.6–4.6)
Alkaline Phosphatase: 58 IU/L (ref 44–121)
BUN/Creatinine Ratio: 11 (ref 10–24)
BUN: 20 mg/dL (ref 8–27)
Bilirubin Total: 0.4 mg/dL (ref 0.0–1.2)
CO2: 20 mmol/L (ref 20–29)
Calcium: 9.8 mg/dL (ref 8.6–10.2)
Chloride: 101 mmol/L (ref 96–106)
Creatinine, Ser: 1.87 mg/dL — ABNORMAL HIGH (ref 0.76–1.27)
Globulin, Total: 2.6 g/dL (ref 1.5–4.5)
Glucose: 99 mg/dL (ref 70–99)
Potassium: 4.5 mmol/L (ref 3.5–5.2)
Sodium: 138 mmol/L (ref 134–144)
Total Protein: 7.2 g/dL (ref 6.0–8.5)
eGFR: 35 mL/min/{1.73_m2} — ABNORMAL LOW (ref >59)

## 2021-12-09 LAB — TSH: TSH: 3.03 u[IU]/mL (ref 0.450–4.500)

## 2021-12-17 ENCOUNTER — Other Ambulatory Visit: Payer: Self-pay | Admitting: Internal Medicine

## 2021-12-17 DIAGNOSIS — I10 Essential (primary) hypertension: Secondary | ICD-10-CM

## 2021-12-17 NOTE — Telephone Encounter (Signed)
Requested Prescriptions  Pending Prescriptions Disp Refills   lisinopril (ZESTRIL) 10 MG tablet [Pharmacy Med Name: Lisinopril 10 MG Oral Tablet] 90 tablet 0    Sig: Take 1 tablet by mouth once daily     Cardiovascular:  ACE Inhibitors Failed - 12/17/2021  2:25 AM      Failed - Cr in normal range and within 180 days    Creatinine, Ser  Date Value Ref Range Status  12/08/2021 1.87 (H) 0.76 - 1.27 mg/dL Final         Passed - K in normal range and within 180 days    Potassium  Date Value Ref Range Status  12/08/2021 4.5 3.5 - 5.2 mmol/L Final         Passed - Patient is not pregnant      Passed - Last BP in normal range    BP Readings from Last 1 Encounters:  12/08/21 (!) 96/59         Passed - Valid encounter within last 6 months    Recent Outpatient Visits          1 week ago Hypothyroidism, unspecified type   [place] Vigg, Avanti, MD   3 months ago Stage 3b chronic kidney disease ([place])   [facility] Vigg, Avanti, MD   6 months ago Primary hypertension   [place] Vigg, Avanti, MD   7 months ago Elevated TSH   [facility] Vigg, Avanti, MD   8 months ago Idiopathic peripheral neuropathy   [place] Vigg, Avanti, MD      Future Appointments            In 6 days Vigg, Avanti, MD [facility], [facility]   In 2 months  [place], [facility]            Not on current med list.

## 2021-12-23 ENCOUNTER — Encounter: Payer: Self-pay | Admitting: Internal Medicine

## 2021-12-23 ENCOUNTER — Ambulatory Visit (INDEPENDENT_AMBULATORY_CARE_PROVIDER_SITE_OTHER): Payer: Medicare Other | Admitting: Internal Medicine

## 2021-12-23 ENCOUNTER — Other Ambulatory Visit: Payer: Self-pay

## 2021-12-23 VITALS — BP 120/68 | HR 67 | Temp 97.6°F | Ht 68.11 in | Wt 168.2 lb

## 2021-12-23 DIAGNOSIS — I10 Essential (primary) hypertension: Secondary | ICD-10-CM | POA: Diagnosis not present

## 2021-12-23 DIAGNOSIS — E785 Hyperlipidemia, unspecified: Secondary | ICD-10-CM | POA: Diagnosis not present

## 2021-12-23 NOTE — Patient Instructions (Signed)
Latest Reference Range & Units 12/08/21 13:35  COMPREHENSIVE METABOLIC PANEL  Rpt !  Sodium 134 - 144 mmol/L 138  Potassium 3.5 - 5.2 mmol/L 4.5  Chloride 96 - 106 mmol/L 101  CO2 20 - 29 mmol/L 20  Glucose 70 - 99 mg/dL 99  BUN 8 - 27 mg/dL 20  Creatinine 0.76 - 1.27 mg/dL 1.87 (H)  Calcium 8.6 - 10.2 mg/dL 9.8  BUN/Creatinine Ratio 10 - 24  11  eGFR >59 mL/min/1.73 35 (L)  Alkaline Phosphatase 44 - 121 IU/L 58  Albumin 3.6 - 4.6 g/dL 4.6  Albumin/Globulin Ratio 1.2 - 2.2  1.8  AST 0 - 40 IU/L 45 (H)  ALT 0 - 44 IU/L 42  Total Protein 6.0 - 8.5 g/dL 7.2  Total Bilirubin 0.0 - 1.2 mg/dL 0.4  Total CHOL/HDL Ratio 0.0 - 5.0 ratio 5.5 (H)  Cholesterol, Total 100 - 199 mg/dL 205 (H)  HDL Cholesterol >39 mg/dL 37 (L)  Triglycerides 0 - 149 mg/dL 232 (H)  VLDL Cholesterol Cal 5 - 40 mg/dL 41 (H)  LDL Chol Calc (NIH) 0 - 99 mg/dL 127 (H)  Globulin, Total 1.5 - 4.5 g/dL 2.6  WBC 3.4 - 10.8 x10E3/uL 7.5  RBC 4.14 - 5.80 x10E6/uL 4.46  Hemoglobin 13.0 - 17.7 g/dL 13.8  HCT 37.5 - 51.0 % 41.9  MCV 79 - 97 fL 94  MCH 26.6 - 33.0 pg 30.9  MCHC 31.5 - 35.7 g/dL 32.9  RDW 11.6 - 15.4 % 13.1  Platelets 150 - 450 x10E3/uL 248  Neutrophils Not Estab. % 54  Immature Granulocytes Not Estab. % 1  NEUT# 1.4 - 7.0 x10E3/uL 4.1  Lymphocyte # 0.7 - 3.1 x10E3/uL 1.7  Monocytes Absolute 0.1 - 0.9 x10E3/uL 0.8  Basophils Absolute 0.0 - 0.2 x10E3/uL 0.1  Immature Grans (Abs) 0.0 - 0.1 x10E3/uL 0.0  Lymphs Not Estab. % 22  Monocytes Not Estab. % 10  Basos Not Estab. % 2  Eos Not Estab. % 11  EOS (ABSOLUTE) 0.0 - 0.4 x10E3/uL 0.9 (H)  TSH 0.450 - 4.500 uIU/mL 3.030  !: Data is abnormal (H): Data is abnormally high (L): Data is abnormally low Rpt: View report in Results Review for more information

## 2021-12-23 NOTE — Progress Notes (Signed)
BP 120/68    Pulse 67    Temp 97.6 F (36.4 C) (Oral)    Ht 5' 8.11" (1.73 m)    Wt 168 lb 3.2 oz (76.3 kg)    SpO2 98%    BMI 25.49 kg/m    Subjective:    Patient ID: Vincent Koch, male    DOB: 11/18/1934, 88 y.o.   MRN: [id number]  Chief Complaint  Patient presents with   Hypertension    HPI: Vincent Koch is a 88 y.o. male  Hypertension This is a chronic problem. The current episode started more than 1 year ago. The problem has been gradually worsening since onset. The problem is controlled. Pertinent negatives include no anxiety, blurred vision, chest pain, malaise/fatigue, neck pain, orthopnea, palpitations, peripheral edema, PND, shortness of breath or sweats.  Hyperlipidemia This is a chronic problem. The current episode started more than 1 month ago. The problem is controlled. Pertinent negatives include no chest pain or shortness of breath.   Chief Complaint  Patient presents with   Hypertension    Relevant past medical, surgical, family and social history reviewed and updated as indicated. Interim medical history since our last visit reviewed. Allergies and medications reviewed and updated.  Review of Systems  Constitutional:  Negative for malaise/fatigue.  Eyes:  Negative for blurred vision.  Respiratory:  Negative for shortness of breath.   Cardiovascular:  Negative for chest pain, palpitations, orthopnea and PND.  Musculoskeletal:  Negative for neck pain.   Per HPI unless specifically indicated above     Objective:    BP 120/68    Pulse 67    Temp 97.6 F (36.4 C) (Oral)    Ht 5' 8.11" (1.73 m)    Wt 168 lb 3.2 oz (76.3 kg)    SpO2 98%    BMI 25.49 kg/m   Wt Readings from Last 3 Encounters:  12/23/21 168 lb 3.2 oz (76.3 kg)  12/08/21 165 lb (74.8 kg)  09/03/21 165 lb 3.2 oz (74.9 kg)    Physical Exam Vitals and nursing note reviewed.  Constitutional:      General: He is not in acute distress.    Appearance: Normal appearance. He is not  ill-appearing or diaphoretic.  HENT:     Head: Normocephalic and atraumatic.     Right Ear: Tympanic membrane and external ear normal. There is no impacted cerumen.     Left Ear: External ear normal.     Nose: No congestion or rhinorrhea.     Mouth/Throat:     Pharynx: No oropharyngeal exudate or posterior oropharyngeal erythema.  Eyes:     Conjunctiva/sclera: Conjunctivae normal.     Pupils: Pupils are equal, round, and reactive to light.  Cardiovascular:     Rate and Rhythm: Normal rate and regular rhythm.     Heart sounds: No murmur heard.   No friction rub. No gallop.  Pulmonary:     Effort: No respiratory distress.     Breath sounds: No stridor. No wheezing or rhonchi.  Chest:     Chest wall: No tenderness.  Abdominal:     General: Abdomen is flat. Bowel sounds are normal.     Palpations: Abdomen is soft. There is no mass.     Tenderness: There is no abdominal tenderness.  Musculoskeletal:     Cervical back: Normal range of motion and neck supple. No rigidity or tenderness.     Left lower leg: No edema.  Skin:  General: Skin is warm and dry.  Neurological:     Mental Status: He is alert.    Results for orders placed or performed in visit on 12/08/21  Comprehensive metabolic panel  Result Value Ref Range   Glucose 99 70 - 99 mg/dL   BUN 20 8 - 27 mg/dL   Creatinine, Ser 1.87 (H) 0.76 - 1.27 mg/dL   eGFR 35 (L) >59 mL/min/1.73   BUN/Creatinine Ratio 11 10 - 24   Sodium 138 134 - 144 mmol/L   Potassium 4.5 3.5 - 5.2 mmol/L   Chloride 101 96 - 106 mmol/L   CO2 20 20 - 29 mmol/L   Calcium 9.8 8.6 - 10.2 mg/dL   Total Protein 7.2 6.0 - 8.5 g/dL   Albumin 4.6 3.6 - 4.6 g/dL   Globulin, Total 2.6 1.5 - 4.5 g/dL   Albumin/Globulin Ratio 1.8 1.2 - 2.2   Bilirubin Total 0.4 0.0 - 1.2 mg/dL   Alkaline Phosphatase 58 44 - 121 IU/L   AST 45 (H) 0 - 40 IU/L   ALT 42 0 - 44 IU/L  CBC with Differential/Platelet  Result Value Ref Range   WBC 7.5 3.4 - 10.8 x10E3/uL   RBC  4.46 4.14 - 5.80 x10E6/uL   Hemoglobin 13.8 13.0 - 17.7 g/dL   Hematocrit 41.9 37.5 - 51.0 %   MCV 94 79 - 97 fL   MCH 30.9 26.6 - 33.0 pg   MCHC 32.9 31.5 - 35.7 g/dL   RDW 13.1 11.6 - 15.4 %   Platelets 248 150 - 450 x10E3/uL   Neutrophils 54 Not Estab. %   Lymphs 22 Not Estab. %   Monocytes 10 Not Estab. %   Eos 11 Not Estab. %   Basos 2 Not Estab. %   Neutrophils Absolute 4.1 1.4 - 7.0 x10E3/uL   Lymphocytes Absolute 1.7 0.7 - 3.1 x10E3/uL   Monocytes Absolute 0.8 0.1 - 0.9 x10E3/uL   EOS (ABSOLUTE) 0.9 (H) 0.0 - 0.4 x10E3/uL   Basophils Absolute 0.1 0.0 - 0.2 x10E3/uL   Immature Granulocytes 1 Not Estab. %   Immature Grans (Abs) 0.0 0.0 - 0.1 x10E3/uL  Lipid panel  Result Value Ref Range   Cholesterol, Total 205 (H) 100 - 199 mg/dL   Triglycerides 232 (H) 0 - 149 mg/dL   HDL 37 (L) >39 mg/dL   VLDL Cholesterol Cal 41 (H) 5 - 40 mg/dL   LDL Chol Calc (NIH) 127 (H) 0 - 99 mg/dL   Chol/HDL Ratio 5.5 (H) 0.0 - 5.0 ratio  TSH  Result Value Ref Range   TSH 3.030 0.450 - 4.500 uIU/mL        Current Outpatient Medications:    acetaminophen (TYLENOL) 500 MG tablet, Take 500 mg by mouth every 6 (six) hours as needed., Disp: , Rfl:    aspirin EC 81 MG tablet, Take 81 mg by mouth once. , Disp: , Rfl:    calcium-vitamin D (OSCAL WITH D) 500-200 MG-UNIT tablet, Take 1 tablet by mouth., Disp: , Rfl:    cyanocobalamin 500 MCG tablet, Take 500 mcg by mouth daily., Disp: , Rfl:    fenofibrate (TRICOR) 145 MG tablet, Take 1 tablet (145 mg total) by mouth daily., Disp: 90 tablet, Rfl: 4   fexofenadine (ALLEGRA) 180 MG tablet, Take 180 mg by mouth as needed for allergies. , Disp: , Rfl:    levothyroxine (SYNTHROID) 25 MCG tablet, Take 1 tablet (25 mcg total) by mouth daily., Disp: 90 tablet, Rfl:  2   Multiple Vitamin (MULTI-VITAMINS) TABS, Take by mouth daily. , Disp: , Rfl:    pantoprazole (PROTONIX) 40 MG tablet, Take 1 tablet by mouth once daily, Disp: 90 tablet, Rfl: 2     Assessment & Plan:  Htn : better off of lisinopril  Off of bp meds now.  Medication compliance emphasised. pt advised to keep Bp logs. Pt verbalised understanding of the same. Pt to have a low salt diet . Exercise to reach a goal of at least 150 mins a week.  lifestyle modifications explained and pt understands importance of the above. Under good control on current regimen. Continue current regimen. Continue to monitor. Call with any concerns. Refills given. Labs drawn today.   HLD is on fenofirbate for such  recheck FLP, check LFT's work on diet, SE of meds explained to pt. low fat and high fiber diet     Problem List Items Addressed This Visit       Cardiovascular and Mediastinum   Hypertension - Primary     No orders of the defined types were placed in this encounter.    No orders of the defined types were placed in this encounter.    Follow up plan: Return in about 6 months (around 06/22/2022).

## 2021-12-24 ENCOUNTER — Other Ambulatory Visit: Payer: Self-pay | Admitting: Nurse Practitioner

## 2021-12-24 NOTE — Telephone Encounter (Signed)
Pharmacy received refill 12/08/21. Requested Prescriptions  Refused Prescriptions Disp Refills   pantoprazole (PROTONIX) 40 MG tablet [Pharmacy Med Name: Pantoprazole Sodium 40 MG Oral Tablet Delayed Release] 90 tablet 0    Sig: Take 1 tablet by mouth once daily     Gastroenterology: Proton Pump Inhibitors Passed - 12/24/2021 12:58 PM      Passed - Valid encounter within last 12 months    Recent Outpatient Visits          Yesterday Primary hypertension   [facility] Vigg, Avanti, MD   2 weeks ago Hypothyroidism, unspecified type   [facility] Vigg, Avanti, MD   3 months ago Stage 3b chronic kidney disease ([place])   [facility] Vigg, Avanti, MD   6 months ago Primary hypertension   [facility] Vigg, Avanti, MD   7 months ago Elevated TSH   [place], [place]      Future Appointments            In 2 months  [place] [place]   In 6 months Vigg, Avanti, MD [facility], [place]

## 2022-01-08 DIAGNOSIS — Z20822 Contact with and (suspected) exposure to covid-19: Secondary | ICD-10-CM | POA: Diagnosis not present

## 2022-02-18 ENCOUNTER — Telehealth (INDEPENDENT_AMBULATORY_CARE_PROVIDER_SITE_OTHER): Payer: Medicare Other | Admitting: Internal Medicine

## 2022-02-18 ENCOUNTER — Ambulatory Visit: Payer: Self-pay | Admitting: *Deleted

## 2022-02-18 DIAGNOSIS — R197 Diarrhea, unspecified: Secondary | ICD-10-CM | POA: Diagnosis not present

## 2022-02-18 MED ORDER — LACTINEX PO CHEW: 1.0000 | CHEWABLE_TABLET | Freq: Three times a day (TID) | ORAL | 1 refills | Status: DC

## 2022-02-18 NOTE — Telephone Encounter (Signed)
?Chief Complaint: abdominal pain , diarrhea ?Symptoms: constipation last week took laxatives now has diarrhea, took imodium and still noted with liquid stools  ?Frequency: na  ?Pertinent Negatives: Patient denies dizziness , lightheadedness, no vomiting  ?Disposition: '[]'$ ED /'[]'$ Urgent Care (no appt availability in office) / '[x]'$ Appointment(In office/virtual)/ '[]'$  [facility]/ '[]'$ Home Care/ '[]'$ Refused Recommended Disposition /'[]'$ [facility]/ '[]'$  Follow-up with PCP ?Additional Notes:  ? ?My chart VV scheduled for today  ? ? Reason for Disposition ? [1] MODERATE diarrhea (e.g., 4-6 times / day more than normal) AND [2] age > 65 years ? ?Answer Assessment - Initial Assessment Questions ?1. LOCATION: "Where does it hurt?"  ?    Abdominal pain ?2. RADIATION: "Does the pain shoot anywhere else?" (e.g., chest, back) ?    na ?3. ONSET: "When did the pain begin?" (Minutes, hours or days ago)  ?    Last week  ?4. SUDDEN: "Gradual or sudden onset?" ?    na ?5. PATTERN "Does the pain come and go, or is it constant?" ?   - If constant: "Is it getting better, staying the same, or worsening?"  ?    (Note: Constant means the pain never goes away completely; most serious pain is constant and it progresses)  ?   - If intermittent: "How long does it last?" "Do you have pain now?" ?    (Note: Intermittent means the pain goes away completely between bouts) ?    Comes and goes when diarrhea comes  ?6. SEVERITY: "How bad is the pain?"  (e.g., Scale 1-10; mild, moderate, or severe) ?   - MILD (1-3): doesn't interfere with normal activities, abdomen soft and not tender to touch  ?   - MODERATE (4-7): interferes with normal activities or awakens from sleep, abdomen tender to touch  ?   - SEVERE (8-10): excruciating pain, doubled over, unable to do any normal activities   ?    Diarrhea interferes with normal activities  ?7. RECURRENT SYMPTOM: "Have you ever had this type of stomach pain before?" If Yes, ask: "When was the last  time?" and "What happened that time?"  ?    Yes  ?8. CAUSE: "What do you think is causing the stomach pain?" ?    Not sure  ?9. RELIEVING/AGGRAVATING FACTORS: "What makes it better or worse?" (e.g., movement, antacids, bowel movement) ?    Na  ?10. OTHER SYMPTOMS: "Do you have any other symptoms?" (e.g., back pain, diarrhea, fever, urination pain, vomiting) ?    Constipated earlier in week took laxatives and now having diarrhea, took imodium ? ?Answer Assessment - Initial Assessment Questions ?1. DIARRHEA SEVERITY: "How bad is the diarrhea?" "How many more stools have you had in the past 24 hours than normal?"  ?  - NO DIARRHEA (SCALE 0) ?  - MILD (SCALE 1-3): Few loose or mushy BMs; increase of 1-3 stools over normal daily number of stools; mild increase in ostomy output. ?  -  MODERATE (SCALE 4-7): Increase of 4-6 stools daily over normal; moderate increase in ostomy output. ?* SEVERE (SCALE 8-10; OR 'WORST POSSIBLE'): Increase of 7 or more stools daily over normal; moderate increase in ostomy output; incontinence. ?    Multiple times did not specify ?2. ONSET: "When did the diarrhea begin?"  ?    This week after taking laxatives  ?3. BM CONSISTENCY: "How loose or watery is the diarrhea?"  ?    Liquid  ?4. VOMITING: "Are you also vomiting?" If  Yes, ask: "How many times in the past 24 hours?"  ?    no ?5. ABDOMINAL PAIN: "Are you having any abdominal pain?" If Yes, ask: "What does it feel like?" (e.g., crampy, dull, intermittent, constant)  ?    Yes  ?6. ABDOMINAL PAIN SEVERITY: If present, ask: "How bad is the pain?"  (e.g., Scale 1-10; mild, moderate, or severe) ?  - MILD (1-3): doesn't interfere with normal activities, abdomen soft and not tender to touch  ?  - MODERATE (4-7): interferes with normal activities or awakens from sleep, abdomen tender to touch  ?  - SEVERE (8-10): excruciating pain, doubled over, unable to do any normal activities   ?   Interferes with normal activities  ?7. ORAL INTAKE: If  vomiting, "Have you been able to drink liquids?" "How much liquids have you had in the past 24 hours?" ?    Able to eat and drink  ?8. HYDRATION: "Any signs of dehydration?" (e.g., dry mouth [not just dry lips], too weak to stand, dizziness, new weight loss) "When did you last urinate?" ?    no ?9. EXPOSURE: "Have you traveled to a foreign country recently?" "Have you been exposed to anyone with diarrhea?" "Could you have eaten any food that was spoiled?" ?    na ?10. ANTIBIOTIC USE: "Are you taking antibiotics now or have you taken antibiotics in the past 2 months?" ?      na ?11. OTHER SYMPTOMS: "Do you have any other symptoms?" (e.g., fever, blood in stool) ?      na ?12. PREGNANCY: "Is there any chance you are pregnant?" "When was your last menstrual period?" ?      na ? ?Protocols used: Abdominal Pain - Male-A-AH, Diarrhea-A-AH ? ?

## 2022-02-18 NOTE — Progress Notes (Addendum)
? ?There were no vitals taken for this visit.  ? ?Subjective:  ? ? Patient ID: Vincent Koch, male    DOB: July 03, 1934, 88 y.o.   MRN: [id number] ? ?Chief Complaint  ?Patient presents with  ? Diarrhea  ?  For a week  ? ? ?HPI: ?Vincent Koch is a 88 y.o. male ? ?Patient presents with: ?Diarrhea: For a week ? ? ?This visit was completed via telephone due to the restrictions of the COVID-19 pandemic. All issues as above were discussed and addressed but no physical exam was performed. If it was felt that the patient should be evaluated in the office, they were directed there. The patient verbally consented to this visit. Patient was unable to complete an audio/visual visit due to Technical difficulties. Due to the catastrophic nature of the COVID-19 pandemic, this visit was done through audio contact only. ?Location of the patient: home ?Location of the provider: work ?Those involved with this call:  ?Provider: Charlynne Cousins, MD ?CMA: Frazier Butt, CMA ?Front Desk/Registration: [employer]  ?Time spent on call: 10 minutes on the phone discussing health concerns. 10 minutes total spent in review of patient's record and preparation of their chart. ? ? ? ?Diarrhea  ?This is a new (was constipated and then has had diarrhea - took metamucil and now has diarrhea took miralax is very weak.  is on pedialyte, pepto , imodium helps some.) problem. Episode onset: was having loose stools about 5 times a day. The stool consistency is described as Mucous. Associated symptoms include chills and myalgias. Pertinent negatives include no abdominal pain, arthralgias, bloating, coughing, fever, headaches, increased  flatus, sweats, URI, vomiting or weight loss. Associated symptoms comments: Has a cramp.  ?Took imodium 30 mins ago . He has tried anti-motility drug for the symptoms. There is no history of malabsorption.  ? ?Chief Complaint  ?Patient presents with  ? Diarrhea  ?  For a week  ? ? ?Relevant past medical, surgical,  family and social history reviewed and updated as indicated. Interim medical history since our last visit reviewed. ?Allergies and medications reviewed and updated. ? ?Review of Systems  ?Constitutional:  Positive for chills. Negative for fever and weight loss.  ?Respiratory:  Negative for cough.   ?Gastrointestinal:  Positive for diarrhea. Negative for abdominal pain, bloating, flatus and vomiting.  ?Musculoskeletal:  Positive for myalgias. Negative for arthralgias.  ?Neurological:  Negative for headaches.  ? ?Per HPI unless specifically indicated above ? ?   ?Objective:  ?  ?There were no vitals taken for this visit.  ?Wt Readings from Last 3 Encounters:  ?03/08/22 161 lb 6.4 oz (73.2 kg)  ?02/21/22 162 lb (73.5 kg)  ?12/23/21 168 lb 3.2 oz (76.3 kg)  ?  ?Physical Exam ? ?Unable to peform sec to virtual visit.  ? ?Results for orders placed or performed in visit on 02/18/22  ?Stool Culture  ? Specimen: Stool  ? ST  ?Result Value Ref Range  ? Salmonella/Shigella Screen Final report   ? Stool Culture result 1 ([facility]ASHR) Comment   ? Campylobacter Culture Final report   ? Stool Culture result 1 (CMPCXR) Comment   ? E coli, Shiga toxin Assay Negative Negative  ?Comprehensive metabolic panel  ?Result Value Ref Range  ? Glucose 133 (H) 70 - 99 mg/dL  ? BUN 12 8 - 27 mg/dL  ? Creatinine, Ser 1.69 (H) 0.76 - 1.27 mg/dL  ? eGFR 39 (L) >59 mL/min/1.73  ? BUN/Creatinine Ratio 7 (L) 10 -  24  ? Sodium 135 134 - 144 mmol/L  ? Potassium 4.1 3.5 - 5.2 mmol/L  ? Chloride 99 96 - 106 mmol/L  ? CO2 20 20 - 29 mmol/L  ? Calcium 9.3 8.6 - 10.2 mg/dL  ? Total Protein 6.9 6.0 - 8.5 g/dL  ? Albumin 3.9 3.6 - 4.6 g/dL  ? Globulin, Total 3.0 1.5 - 4.5 g/dL  ? Albumin/Globulin Ratio 1.3 1.2 - 2.2  ? Bilirubin Total 0.5 0.0 - 1.2 mg/dL  ? Alkaline Phosphatase 63 44 - 121 IU/L  ? AST 23 0 - 40 IU/L  ? ALT 18 0 - 44 IU/L  ?CBC with Differential/Platelet  ?Result Value Ref Range  ? WBC 16.5 (H) 3.4 - 10.8 x10E3/uL  ? RBC 4.62 4.14 - 5.80 x10E6/uL   ? Hemoglobin 14.0 13.0 - 17.7 g/dL  ? Hematocrit 42.7 37.5 - 51.0 %  ? MCV 92 79 - 97 fL  ? MCH 30.3 26.6 - 33.0 pg  ? MCHC 32.8 31.5 - 35.7 g/dL  ? RDW 12.8 11.6 - 15.4 %  ? Platelets 317 150 - 450 x10E3/uL  ? Neutrophils 78 Not Estab. %  ? Lymphs 8 Not Estab. %  ? Monocytes 9 Not Estab. %  ? Eos 3 Not Estab. %  ? Basos 1 Not Estab. %  ? Neutrophils Absolute 12.8 (H) 1.4 - 7.0 x10E3/uL  ? Lymphocytes Absolute 1.4 0.7 - 3.1 x10E3/uL  ? Monocytes Absolute 1.5 (H) 0.1 - 0.9 x10E3/uL  ? EOS (ABSOLUTE) 0.5 (H) 0.0 - 0.4 x10E3/uL  ? Basophils Absolute 0.1 0.0 - 0.2 x10E3/uL  ? Immature Granulocytes 1 Not Estab. %  ? Immature Grans (Abs) 0.2 (H) 0.0 - 0.1 x10E3/uL  ? ?   ? ? ?Current Outpatient Medications:  ?  acetaminophen (TYLENOL) 500 MG tablet, Take 500 mg by mouth every 6 (six) hours as needed., Disp: , Rfl:  ?  aspirin EC 81 MG tablet, Take 81 mg by mouth once. , Disp: , Rfl:  ?  calcium-vitamin D (OSCAL WITH D) 500-200 MG-UNIT tablet, Take 1 tablet by mouth., Disp: , Rfl:  ?  fexofenadine (ALLEGRA) 180 MG tablet, Take 180 mg by mouth as needed for allergies. , Disp: , Rfl:  ?  lactobacillus acidophilus & bulgar (LACTINEX) chewable tablet, Chew 1 tablet by mouth 3 (three) times daily with meals., Disp: 30 tablet, Rfl: 1 ?  levothyroxine (SYNTHROID) 25 MCG tablet, Take 1 tablet (25 mcg total) by mouth daily., Disp: 90 tablet, Rfl: 2 ?  Multiple Vitamin (MULTI-VITAMINS) TABS, Take by mouth daily. , Disp: , Rfl:  ?  pantoprazole (PROTONIX) 40 MG tablet, Take 1 tablet by mouth once daily, Disp: 90 tablet, Rfl: 2 ?  acidophilus (RISAQUAD) CAPS capsule, Take 2 capsules by mouth 3 (three) times daily., Disp: 90 capsule, Rfl: 0 ?  colchicine 0.6 MG tablet, Take 1 tablet (0.6 mg total) by mouth daily., Disp: 30 tablet, Rfl: 1 ?  fenofibrate (TRICOR) 145 MG tablet, Take 1 tablet (145 mg total) by mouth daily. Hold till your C. difficile infection completely resolves, Disp: , Rfl:  ?  methylPREDNISolone (MEDROL DOSEPAK) 4 MG  TBPK tablet, Use as directed, Disp: 1 each, Rfl: 0 ?  ondansetron (ZOFRAN) 4 MG tablet, Take 1 tablet (4 mg total) by mouth every 6 (six) hours as needed for nausea., Disp: 20 tablet, Rfl: 0  ? ? ?Assessment & Plan:  ?Acute Diarrhea ? Sec to viral Gastroenteritis : ?will need to check for COVID  and FLU . ?Will need to use probiotics after every loose stool ?Consider imodium  ?Pt advised a bland BRAT diet today.  ?Advised to call the office or go to the ER if she develops further abdominal pain crampign, any new onset of bleeding / black stools or  fresh red blood from any orifice,.  Pt verbalized understanding of such. ? ?Problem List Items Addressed This Visit   ? ?  ? Other  ? Diarrhea - Primary  ? Relevant Orders  ? Comprehensive metabolic panel (Completed)  ? CBC with Differential/Platelet (Completed)  ? Stool Culture (Completed)  ? Ova and parasite examination  ?  ? ?Orders Placed This Encounter  ?Procedures  ? Stool Culture  ? Ova and parasite examination  ? Comprehensive metabolic panel  ? CBC with Differential/Platelet  ?  ? ?Meds ordered this encounter  ?Medications  ? lactobacillus acidophilus & bulgar (LACTINEX) chewable tablet  ?  Sig: Chew 1 tablet by mouth 3 (three) times daily with meals.  ?  Dispense:  30 tablet  ?  Refill:  1  ?  ? ?Follow up plan: ?No follow-ups on file. ? ? ?

## 2022-02-19 ENCOUNTER — Other Ambulatory Visit: Payer: Medicare Other

## 2022-02-19 DIAGNOSIS — J069 Acute upper respiratory infection, unspecified: Secondary | ICD-10-CM

## 2022-02-19 DIAGNOSIS — R197 Diarrhea, unspecified: Secondary | ICD-10-CM | POA: Diagnosis not present

## 2022-02-19 LAB — VERITOR FLU A/B WAIVED
Influenza A: NEGATIVE
Influenza B: NEGATIVE

## 2022-02-20 LAB — COMPREHENSIVE METABOLIC PANEL
ALT: 18 IU/L (ref 0–44)
AST: 23 IU/L (ref 0–40)
Albumin/Globulin Ratio: 1.3 (ref 1.2–2.2)
Albumin: 3.9 g/dL (ref 3.6–4.6)
Alkaline Phosphatase: 63 IU/L (ref 44–121)
BUN/Creatinine Ratio: 7 — ABNORMAL LOW (ref 10–24)
BUN: 12 mg/dL (ref 8–27)
Bilirubin Total: 0.5 mg/dL (ref 0.0–1.2)
CO2: 20 mmol/L (ref 20–29)
Calcium: 9.3 mg/dL (ref 8.6–10.2)
Chloride: 99 mmol/L (ref 96–106)
Creatinine, Ser: 1.69 mg/dL — ABNORMAL HIGH (ref 0.76–1.27)
Globulin, Total: 3 g/dL (ref 1.5–4.5)
Glucose: 133 mg/dL — ABNORMAL HIGH (ref 70–99)
Potassium: 4.1 mmol/L (ref 3.5–5.2)
Sodium: 135 mmol/L (ref 134–144)
Total Protein: 6.9 g/dL (ref 6.0–8.5)
eGFR: 39 mL/min/{1.73_m2} — ABNORMAL LOW (ref >59)

## 2022-02-20 LAB — CBC WITH DIFFERENTIAL/PLATELET
Basophils Absolute: 0.1 10*3/uL (ref 0.0–0.2)
Basos: 1 %
EOS (ABSOLUTE): 0.5 10*3/uL — ABNORMAL HIGH (ref 0.0–0.4)
Eos: 3 %
Hematocrit: 42.7 % (ref 37.5–51.0)
Hemoglobin: 14 g/dL (ref 13.0–17.7)
Immature Grans (Abs): 0.2 10*3/uL — ABNORMAL HIGH (ref 0.0–0.1)
Immature Granulocytes: 1 %
Lymphocytes Absolute: 1.4 10*3/uL (ref 0.7–3.1)
Lymphs: 8 %
MCH: 30.3 pg (ref 26.6–33.0)
MCHC: 32.8 g/dL (ref 31.5–35.7)
MCV: 92 fL (ref 79–97)
Monocytes Absolute: 1.5 10*3/uL — ABNORMAL HIGH (ref 0.1–0.9)
Monocytes: 9 %
Neutrophils Absolute: 12.8 10*3/uL — ABNORMAL HIGH (ref 1.4–7.0)
Neutrophils: 78 %
Platelets: 317 10*3/uL (ref 150–450)
RBC: 4.62 x10E6/uL (ref 4.14–5.80)
RDW: 12.8 % (ref 11.6–15.4)
WBC: 16.5 10*3/uL — ABNORMAL HIGH (ref 3.4–10.8)

## 2022-02-20 LAB — NOVEL CORONAVIRUS, NAA: SARS-CoV-2, NAA: NOT DETECTED

## 2022-02-21 ENCOUNTER — Inpatient Hospital Stay
Admission: EM | Admit: 2022-02-21 | Discharge: 2022-02-25 | DRG: 872 | Disposition: A | Discharge status: Home / Self Care | Payer: Medicare Other | Attending: Internal Medicine | Admitting: Internal Medicine

## 2022-02-21 ENCOUNTER — Other Ambulatory Visit: Payer: Self-pay

## 2022-02-21 DIAGNOSIS — I129 Hypertensive chronic kidney disease with stage 1 through stage 4 chronic kidney disease, or unspecified chronic kidney disease: Secondary | ICD-10-CM | POA: Diagnosis present

## 2022-02-21 DIAGNOSIS — E876 Hypokalemia: Secondary | ICD-10-CM | POA: Diagnosis present

## 2022-02-21 DIAGNOSIS — E871 Hypo-osmolality and hyponatremia: Secondary | ICD-10-CM | POA: Diagnosis present

## 2022-02-21 DIAGNOSIS — E7800 Pure hypercholesterolemia, unspecified: Secondary | ICD-10-CM | POA: Diagnosis present

## 2022-02-21 DIAGNOSIS — A419 Sepsis, unspecified organism: Secondary | ICD-10-CM | POA: Diagnosis present

## 2022-02-21 DIAGNOSIS — I10 Essential (primary) hypertension: Secondary | ICD-10-CM | POA: Diagnosis present

## 2022-02-21 DIAGNOSIS — N200 Calculus of kidney: Secondary | ICD-10-CM | POA: Diagnosis not present

## 2022-02-21 DIAGNOSIS — R188 Other ascites: Secondary | ICD-10-CM | POA: Diagnosis not present

## 2022-02-21 DIAGNOSIS — A0472 Enterocolitis due to Clostridium difficile, not specified as recurrent: Secondary | ICD-10-CM | POA: Diagnosis present

## 2022-02-21 DIAGNOSIS — R197 Diarrhea, unspecified: Secondary | ICD-10-CM | POA: Diagnosis not present

## 2022-02-21 DIAGNOSIS — A4189 Other specified sepsis: Principal | ICD-10-CM | POA: Diagnosis present

## 2022-02-21 DIAGNOSIS — I959 Hypotension, unspecified: Secondary | ICD-10-CM | POA: Diagnosis not present

## 2022-02-21 DIAGNOSIS — K6389 Other specified diseases of intestine: Secondary | ICD-10-CM | POA: Diagnosis not present

## 2022-02-21 DIAGNOSIS — R9431 Abnormal electrocardiogram [ECG] [EKG]: Secondary | ICD-10-CM | POA: Diagnosis not present

## 2022-02-21 DIAGNOSIS — K760 Fatty (change of) liver, not elsewhere classified: Secondary | ICD-10-CM | POA: Diagnosis present

## 2022-02-21 DIAGNOSIS — K219 Gastro-esophageal reflux disease without esophagitis: Secondary | ICD-10-CM | POA: Diagnosis not present

## 2022-02-21 DIAGNOSIS — K529 Noninfective gastroenteritis and colitis, unspecified: Secondary | ICD-10-CM | POA: Diagnosis present

## 2022-02-21 DIAGNOSIS — E785 Hyperlipidemia, unspecified: Secondary | ICD-10-CM | POA: Diagnosis not present

## 2022-02-21 DIAGNOSIS — E861 Hypovolemia: Secondary | ICD-10-CM | POA: Diagnosis present

## 2022-02-21 DIAGNOSIS — E039 Hypothyroidism, unspecified: Secondary | ICD-10-CM | POA: Diagnosis present

## 2022-02-21 DIAGNOSIS — N1832 Chronic kidney disease, stage 3b: Secondary | ICD-10-CM | POA: Diagnosis present

## 2022-02-21 DIAGNOSIS — N400 Enlarged prostate without lower urinary tract symptoms: Secondary | ICD-10-CM | POA: Diagnosis not present

## 2022-02-21 DIAGNOSIS — R52 Pain, unspecified: Secondary | ICD-10-CM | POA: Diagnosis not present

## 2022-02-21 DIAGNOSIS — R1084 Generalized abdominal pain: Secondary | ICD-10-CM | POA: Diagnosis not present

## 2022-02-21 DIAGNOSIS — N179 Acute kidney failure, unspecified: Secondary | ICD-10-CM | POA: Diagnosis not present

## 2022-02-21 LAB — URINALYSIS, ROUTINE W REFLEX MICROSCOPIC
Bacteria, UA: NONE SEEN
Bilirubin Urine: NEGATIVE
Glucose, UA: NEGATIVE mg/dL
Hgb urine dipstick: NEGATIVE
Ketones, ur: NEGATIVE mg/dL
Leukocytes,Ua: NEGATIVE
Nitrite: NEGATIVE
Protein, ur: 30 mg/dL — AB
Specific Gravity, Urine: 1.024 (ref 1.005–1.030)
pH: 5 (ref 5.0–8.0)

## 2022-02-21 LAB — CBC
HCT: 43.5 % (ref 39.0–52.0)
Hemoglobin: 14.6 g/dL (ref 13.0–17.0)
MCH: 30.5 pg (ref 26.0–34.0)
MCHC: 33.6 g/dL (ref 30.0–36.0)
MCV: 90.8 fL (ref 80.0–100.0)
Platelets: 379 10*3/uL (ref 150–400)
RBC: 4.79 MIL/uL (ref 4.22–5.81)
RDW: 13.4 % (ref 11.5–15.5)
WBC: 28.3 10*3/uL — ABNORMAL HIGH (ref 4.0–10.5)
nRBC: 0 % (ref 0.0–0.2)

## 2022-02-21 MED ORDER — SODIUM CHLORIDE 0.9 % IV BOLUS
500.0000 mL | Freq: Once | INTRAVENOUS | Status: AC
Start: 2022-02-21 — End: 2022-02-22
  Administered 2022-02-21: 500 mL via INTRAVENOUS

## 2022-02-21 MED ORDER — ONDANSETRON HCL 4 MG/2ML IJ SOLN
4.0000 mg | Freq: Once | INTRAMUSCULAR | Status: AC
  Administered 2022-02-21: 4 mg via INTRAVENOUS
  Filled 2022-02-21: qty 2

## 2022-02-21 NOTE — ED Provider Notes (Signed)
? ?[facility] ?Provider Note ? ? ? Event Date/Time  ? First MD Initiated Contact with Patient 02/21/22 2302   ?  (approximate) ? ? ?History  ? ?Abdominal Pain ? ? ?HPI ? ?Vincent Koch is a 88 y.o. male with a history of high cholesterol and hypothyroidism who presents with abdominal pain over the last 2 weeks, persistent course, mainly on the left side of his abdomen, and associated with abdominal distention and bloating.  The patient also has associated nausea and decreased appetite but has not vomited.  He has had diarrhea which has been partially resolved with Imodium.  He denies any blood in the stool.  He has no fever or chills and no urinary symptoms.  He denies any sick contacts.  He has had some similar stomach problems intermittently over the last few years.  The patient and family state that he had some blood work and a stool sample done at his doctor's office 2 days ago but has not received the results.  In the last 2 days the symptoms got worse and he felt that he needed to come in.  He denies any prior history of abdominal surgeries. ? ? ? ?Physical Exam  ? ?Triage Vital Signs: ?ED Triage Vitals  ?Enc Vitals Group  ?   BP 02/21/22 2228 126/76  ?   Pulse Rate 02/21/22 2228 97  ?   Resp 02/21/22 2228 20  ?   Temp 02/21/22 2228 98.1 ?F (36.7 ?C)  ?   Temp Source 02/21/22 2228 Oral  ?   SpO2 02/21/22 2228 94 %  ?   Weight 02/21/22 2232 162 lb (73.5 kg)  ?   Height 02/21/22 2232 '5\' 10"'$  (1.778 m)  ?   Head Circumference --   ?   Peak Flow --   ?   Pain Score 02/21/22 2232 8  ?   Pain Loc --   ?   Pain Edu? --   ?   Excl. in [place]? --   ? ? ?Most recent vital signs: ?Vitals:  ? 02/21/22 2310 02/22/22 0137  ?BP:  (!) 115/58  ?Pulse:  76  ?Resp:  14  ?Temp:    ?SpO2: 96% 95%  ? ? ? ?General: Awake, no distress.  ?CV:  Good peripheral perfusion.  ?Resp:  Normal effort.  ?Abd:  Slightly distended.  Soft with mild left lower quadrant tenderness. ?Other:  No scleral icterus.  Slightly dry  mucous membranes. ? ? ?ED Results / Procedures / Treatments  ? ?Labs ?(all labs ordered are listed, but only abnormal results are displayed) ?Labs Reviewed  ?COMPREHENSIVE METABOLIC PANEL - Abnormal; Notable for the following components:  ?    Result Value  ? Sodium 131 (*)   ? CO2 18 (*)   ? Glucose, Bld 166 (*)   ? Creatinine, Ser 1.74 (*)   ? Albumin 3.4 (*)   ? GFR, Estimated 38 (*)   ? All other components within normal limits  ?CBC - Abnormal; Notable for the following components:  ? WBC 28.3 (*)   ? All other components within normal limits  ?URINALYSIS, ROUTINE W REFLEX MICROSCOPIC - Abnormal; Notable for the following components:  ? Color, Urine AMBER (*)   ? APPearance CLEAR (*)   ? Protein, ur 30 (*)   ? All other components within normal limits  ?LIPASE, BLOOD  ?LACTIC ACID, PLASMA  ? ? ? ?EKG ? ?ED ECG REPORT ?Silvio Pate, the attending physician, personally  viewed and interpreted this ECG. ? ?Date: 02/21/2022 ?EKG Time: 2242 ?Rate: 91 ?Rhythm: normal sinus rhythm ?QRS Axis: normal ?Intervals: normal ?ST/T Wave abnormalities: normal ?Narrative Interpretation: no evidence of acute ischemia ? ? ? ?RADIOLOGY ? ?CT abdomen/pelvis: I independently viewed and interpreted the images; there is wall thickening of the large intestine consistent with colitis.  Radiology report also notes the following: ? ?IMPRESSION:  ?1. Diffuse colonic wall thickening likely representing colitis.  ?2. Small amount of free fluid in the pelvis and mesentery is likely  ?reactive.  ?3. Emphysematous changes in the lung bases.  ?4. Large esophageal hiatal hernia.  ?5. Fatty infiltration of the liver.  ?6. Nonobstructing stones in the left kidney.  ?7. Aortic atherosclerosis.  ?8. Enlarged prostate gland.  ?9. Small right inguinal hernia containing fat.  ? ? ?PROCEDURES: ? ?Critical Care performed: No ? ?Procedures ? ? ?MEDICATIONS ORDERED IN ED: ?Medications  ?piperacillin-tazobactam (ZOSYN) IVPB 3.375 g (has no  administration in time range)  ?sodium chloride 0.9 % bolus 500 mL (0 mLs Intravenous Stopped 02/22/22 0116)  ?ondansetron [facility]) injection 4 mg (4 mg Intravenous Given 02/21/22 2327)  ?iohexol (OMNIPAQUE) 300 MG/ML solution 75 mL (75 mLs Intravenous Contrast Given 02/22/22 0111)  ?morphine (PF) 4 MG/ML injection 4 mg (4 mg Intravenous Given 02/22/22 0148)  ? ? ? ?IMPRESSION / MDM / ASSESSMENT AND PLAN / ED COURSE  ?I reviewed the triage vital signs and the nursing notes. ? ?88 year old male with PMH as noted above presents with 2 weeks of abdominal pain and distention along with nausea and diarrhea. ? ?On exam he is overall well-appearing for his age and the vital signs are normal.  The abdomen is soft with mild left lower quadrant tenderness and some distention.  Exam is otherwise unremarkable. ? ?EKG is nonischemic. ? ?Initial lab work-up is significant for leukocytosis with a WBC count of 28.  Urinalysis shows no acute findings. ? ?Differential diagnosis includes, but is not limited to, diverticulitis, colitis, appendicitis, SBO, volvulus, malignancy. ? ?We will obtain CT abdomen/pelvis and reassess. ? ?----------------------------------------- ?2:08 AM on 02/22/2022 ?----------------------------------------- ? ?CMP is overall unremarkable except for minimally elevated creatinine.  LFTs are normal.  Lipase is normal. ? ?CT shows evidence of diffuse colitis.  Given the patient's elevated WBC count, and the extent of the colitis, and his difficulty tolerating p.o., we will admit for IV antibiotics.  I consulted Dr. Sidney Ace from the hospitalist service; based on our discussion he agrees to admit the patient. ? ? ?FINAL CLINICAL IMPRESSION(S) / ED DIAGNOSES  ? ?Final diagnoses:  ?Colitis  ? ? ? ?Rx / DC Orders  ? ?ED Discharge Orders   ? ? None  ? ?  ? ? ? ?Note:  This document was prepared using Dragon voice recognition software and may include unintentional dictation errors.  ?  Arta Silence, MD ?02/22/22 0209 ? ?

## 2022-02-21 NOTE — ED Notes (Signed)
Per wife, pt has had nausea, no vomiting, diarrhea x 2 weeks.  Pt not able to tolerate PO, taking pedialyte, water, toast and some 'bland foods' well per family.  No appetite. Weakness, Abd pain center abd, tight and bloated upon assessment.  Pt describes pain as tightness at this time.   ?

## 2022-02-21 NOTE — ED Notes (Signed)
First rn note: per ems pt with abd pain for 2 weeks, per ems pt is vomiting and not able to keep po down. Last bowel movement approx 30 min pta with black pebble stool. Vs 93, pox 95% on ra, 150/63.  ?

## 2022-02-21 NOTE — ED Triage Notes (Signed)
Pt states he has had abdominal pain since this past Thursday, pt states he has had nausea and diarrhea. Pt denies other family with similar symptoms. Pt states he has had a decreased appetite.  ?

## 2022-02-22 ENCOUNTER — Other Ambulatory Visit (HOSPITAL_COMMUNITY): Payer: Self-pay

## 2022-02-22 ENCOUNTER — Emergency Department: Payer: Medicare Other

## 2022-02-22 ENCOUNTER — Other Ambulatory Visit: Payer: Medicare Other

## 2022-02-22 ENCOUNTER — Telehealth: Payer: Self-pay

## 2022-02-22 DIAGNOSIS — E039 Hypothyroidism, unspecified: Secondary | ICD-10-CM | POA: Diagnosis present

## 2022-02-22 DIAGNOSIS — N400 Enlarged prostate without lower urinary tract symptoms: Secondary | ICD-10-CM | POA: Diagnosis present

## 2022-02-22 DIAGNOSIS — R197 Diarrhea, unspecified: Secondary | ICD-10-CM | POA: Diagnosis present

## 2022-02-22 DIAGNOSIS — E876 Hypokalemia: Secondary | ICD-10-CM | POA: Diagnosis present

## 2022-02-22 DIAGNOSIS — N179 Acute kidney failure, unspecified: Secondary | ICD-10-CM | POA: Diagnosis present

## 2022-02-22 DIAGNOSIS — N200 Calculus of kidney: Secondary | ICD-10-CM | POA: Diagnosis not present

## 2022-02-22 DIAGNOSIS — E785 Hyperlipidemia, unspecified: Secondary | ICD-10-CM | POA: Diagnosis not present

## 2022-02-22 DIAGNOSIS — I10 Essential (primary) hypertension: Secondary | ICD-10-CM | POA: Diagnosis present

## 2022-02-22 DIAGNOSIS — K529 Noninfective gastroenteritis and colitis, unspecified: Secondary | ICD-10-CM | POA: Diagnosis present

## 2022-02-22 DIAGNOSIS — K760 Fatty (change of) liver, not elsewhere classified: Secondary | ICD-10-CM | POA: Diagnosis present

## 2022-02-22 DIAGNOSIS — N1832 Chronic kidney disease, stage 3b: Secondary | ICD-10-CM | POA: Diagnosis present

## 2022-02-22 DIAGNOSIS — E871 Hypo-osmolality and hyponatremia: Secondary | ICD-10-CM | POA: Diagnosis present

## 2022-02-22 DIAGNOSIS — A0472 Enterocolitis due to Clostridium difficile, not specified as recurrent: Secondary | ICD-10-CM | POA: Diagnosis present

## 2022-02-22 DIAGNOSIS — E861 Hypovolemia: Secondary | ICD-10-CM | POA: Diagnosis present

## 2022-02-22 DIAGNOSIS — A419 Sepsis, unspecified organism: Secondary | ICD-10-CM | POA: Diagnosis not present

## 2022-02-22 DIAGNOSIS — R188 Other ascites: Secondary | ICD-10-CM | POA: Diagnosis not present

## 2022-02-22 DIAGNOSIS — K219 Gastro-esophageal reflux disease without esophagitis: Secondary | ICD-10-CM | POA: Diagnosis present

## 2022-02-22 DIAGNOSIS — E7800 Pure hypercholesterolemia, unspecified: Secondary | ICD-10-CM | POA: Diagnosis present

## 2022-02-22 DIAGNOSIS — I129 Hypertensive chronic kidney disease with stage 1 through stage 4 chronic kidney disease, or unspecified chronic kidney disease: Secondary | ICD-10-CM | POA: Diagnosis present

## 2022-02-22 DIAGNOSIS — A4189 Other specified sepsis: Secondary | ICD-10-CM | POA: Diagnosis present

## 2022-02-22 DIAGNOSIS — K6389 Other specified diseases of intestine: Secondary | ICD-10-CM | POA: Diagnosis not present

## 2022-02-22 LAB — COMPREHENSIVE METABOLIC PANEL
ALT: 18 U/L (ref 0–44)
AST: 21 U/L (ref 15–41)
Albumin: 3.4 g/dL — ABNORMAL LOW (ref 3.5–5.0)
Alkaline Phosphatase: 72 U/L (ref 38–126)
Anion gap: 13 (ref 5–15)
BUN: 21 mg/dL (ref 8–23)
CO2: 18 mmol/L — ABNORMAL LOW (ref 22–32)
Calcium: 8.9 mg/dL (ref 8.9–10.3)
Chloride: 100 mmol/L (ref 98–111)
Creatinine, Ser: 1.74 mg/dL — ABNORMAL HIGH (ref 0.61–1.24)
GFR, Estimated: 38 mL/min — ABNORMAL LOW (ref >60)
Glucose, Bld: 166 mg/dL — ABNORMAL HIGH (ref 70–99)
Potassium: 3.9 mmol/L (ref 3.5–5.1)
Sodium: 131 mmol/L — ABNORMAL LOW (ref 135–145)
Total Bilirubin: 0.9 mg/dL (ref 0.3–1.2)
Total Protein: 7.5 g/dL (ref 6.5–8.1)

## 2022-02-22 LAB — CBC
HCT: 40.2 % (ref 39.0–52.0)
Hemoglobin: 13.3 g/dL (ref 13.0–17.0)
MCH: 30.2 pg (ref 26.0–34.0)
MCHC: 33.1 g/dL (ref 30.0–36.0)
MCV: 91.4 fL (ref 80.0–100.0)
Platelets: 325 10*3/uL (ref 150–400)
RBC: 4.4 MIL/uL (ref 4.22–5.81)
RDW: 13.4 % (ref 11.5–15.5)
WBC: 26 10*3/uL — ABNORMAL HIGH (ref 4.0–10.5)
nRBC: 0 % (ref 0.0–0.2)

## 2022-02-22 LAB — GASTROINTESTINAL PANEL BY PCR, STOOL (REPLACES STOOL CULTURE)

## 2022-02-22 LAB — C DIFFICILE QUICK SCREEN W PCR REFLEX
C Diff antigen: POSITIVE — AB
C Diff interpretation: DETECTED
C Diff toxin: POSITIVE — AB

## 2022-02-22 LAB — PROCALCITONIN: Procalcitonin: 0.22 ng/mL

## 2022-02-22 LAB — PROTIME-INR
INR: 1.2 (ref 0.8–1.2)
Prothrombin Time: 15.1 seconds (ref 11.4–15.2)

## 2022-02-22 LAB — BASIC METABOLIC PANEL
Anion gap: 15 (ref 5–15)
BUN: 20 mg/dL (ref 8–23)
CO2: 22 mmol/L (ref 22–32)
Calcium: 8.7 mg/dL — ABNORMAL LOW (ref 8.9–10.3)
Chloride: 98 mmol/L (ref 98–111)
Creatinine, Ser: 1.74 mg/dL — ABNORMAL HIGH (ref 0.61–1.24)
GFR, Estimated: 38 mL/min — ABNORMAL LOW (ref >60)
Glucose, Bld: 167 mg/dL — ABNORMAL HIGH (ref 70–99)
Potassium: 3.9 mmol/L (ref 3.5–5.1)
Sodium: 135 mmol/L (ref 135–145)

## 2022-02-22 LAB — LACTIC ACID, PLASMA: Lactic Acid, Venous: 1.5 mmol/L (ref 0.5–1.9)

## 2022-02-22 LAB — LIPASE, BLOOD: Lipase: 48 U/L (ref 11–51)

## 2022-02-22 LAB — CORTISOL-AM, BLOOD: Cortisol - AM: 29.4 ug/dL — ABNORMAL HIGH (ref 6.7–22.6)

## 2022-02-22 MED ORDER — MORPHINE SULFATE (PF) 2 MG/ML IV SOLN: 2.0000 mg | INTRAVENOUS | Status: DC | PRN

## 2022-02-22 MED ORDER — PANTOPRAZOLE SODIUM 40 MG IV SOLR
40.0000 mg | INTRAVENOUS | Status: DC
  Administered 2022-02-22 – 2022-02-25 (×4): 40 mg via INTRAVENOUS
  Filled 2022-02-22 (×4): qty 10

## 2022-02-22 MED ORDER — ONDANSETRON HCL 4 MG PO TABS: 4.0000 mg | ORAL_TABLET | Freq: Four times a day (QID) | ORAL | Status: DC | PRN

## 2022-02-22 MED ORDER — ENOXAPARIN SODIUM 40 MG/0.4ML IJ SOSY
40.0000 mg | PREFILLED_SYRINGE | INTRAMUSCULAR | Status: DC
  Administered 2022-02-22 – 2022-02-25 (×4): 40 mg via SUBCUTANEOUS
  Filled 2022-02-22 (×4): qty 0.4

## 2022-02-22 MED ORDER — LEVOTHYROXINE SODIUM 25 MCG PO TABS
25.0000 ug | ORAL_TABLET | Freq: Every day | ORAL | Status: DC
  Administered 2022-02-22 – 2022-02-25 (×4): 25 ug via ORAL
  Filled 2022-02-22 (×4): qty 1

## 2022-02-22 MED ORDER — MORPHINE SULFATE (PF) 4 MG/ML IV SOLN
4.0000 mg | Freq: Once | INTRAVENOUS | Status: AC
  Administered 2022-02-22: 4 mg via INTRAVENOUS
  Filled 2022-02-22: qty 1

## 2022-02-22 MED ORDER — RISAQUAD PO CAPS
2.0000 | ORAL_CAPSULE | Freq: Three times a day (TID) | ORAL | Status: DC
  Administered 2022-02-22 – 2022-02-25 (×9): 2 via ORAL
  Filled 2022-02-22 (×9): qty 2

## 2022-02-22 MED ORDER — PIPERACILLIN-TAZOBACTAM 3.375 G IVPB 30 MIN
3.3750 g | Freq: Once | INTRAVENOUS | Status: AC
  Administered 2022-02-22: 3.375 g via INTRAVENOUS
  Filled 2022-02-22: qty 50

## 2022-02-22 MED ORDER — ACETAMINOPHEN 650 MG RE SUPP: 650.0000 mg | Freq: Four times a day (QID) | RECTAL | Status: DC | PRN

## 2022-02-22 MED ORDER — METRONIDAZOLE 500 MG/100ML IV SOLN
500.0000 mg | Freq: Three times a day (TID) | INTRAVENOUS | Status: DC
  Administered 2022-02-22 – 2022-02-25 (×9): 500 mg via INTRAVENOUS
  Filled 2022-02-22 (×10): qty 100

## 2022-02-22 MED ORDER — VANCOMYCIN HCL 125 MG PO CAPS
125.0000 mg | ORAL_CAPSULE | Freq: Four times a day (QID) | ORAL | Status: DC
  Administered 2022-02-22 – 2022-02-25 (×13): 125 mg via ORAL
  Filled 2022-02-22 (×13): qty 1

## 2022-02-22 MED ORDER — TRAZODONE HCL 50 MG PO TABS
25.0000 mg | ORAL_TABLET | Freq: Every evening | ORAL | Status: DC | PRN
  Administered 2022-02-23: 25 mg via ORAL
  Filled 2022-02-22: qty 1

## 2022-02-22 MED ORDER — FENOFIBRATE 160 MG PO TABS
160.0000 mg | ORAL_TABLET | Freq: Every day | ORAL | Status: DC
  Administered 2022-02-22: 160 mg via ORAL
  Filled 2022-02-22: qty 1

## 2022-02-22 MED ORDER — FIDAXOMICIN 200 MG PO TABS
200.0000 mg | ORAL_TABLET | Freq: Two times a day (BID) | ORAL | Status: DC
  Filled 2022-02-22: qty 1

## 2022-02-22 MED ORDER — IOHEXOL 300 MG/ML  SOLN
75.0000 mL | Freq: Once | INTRAMUSCULAR | Status: AC | PRN
  Administered 2022-02-22: 75 mL via INTRAVENOUS

## 2022-02-22 MED ORDER — SODIUM CHLORIDE 0.9 % IV SOLN
2.0000 g | INTRAVENOUS | Status: DC
  Administered 2022-02-22: 2 g via INTRAVENOUS
  Filled 2022-02-22: qty 20

## 2022-02-22 MED ORDER — ACETAMINOPHEN 325 MG PO TABS: 650.0000 mg | ORAL_TABLET | Freq: Four times a day (QID) | ORAL | Status: DC | PRN

## 2022-02-22 MED ORDER — PANTOPRAZOLE SODIUM 40 MG PO TBEC: 40.0000 mg | DELAYED_RELEASE_TABLET | Freq: Every day | ORAL | Status: DC

## 2022-02-22 MED ORDER — SODIUM CHLORIDE 0.9 % IV SOLN: INTRAVENOUS | Status: DC

## 2022-02-22 MED ORDER — METRONIDAZOLE 500 MG/100ML IV SOLN
500.0000 mg | Freq: Two times a day (BID) | INTRAVENOUS | Status: DC
  Administered 2022-02-22: 500 mg via INTRAVENOUS
  Filled 2022-02-22 (×2): qty 100

## 2022-02-22 MED ORDER — ONDANSETRON HCL 4 MG/2ML IJ SOLN
4.0000 mg | Freq: Four times a day (QID) | INTRAMUSCULAR | Status: DC | PRN
  Administered 2022-02-23 (×2): 4 mg via INTRAVENOUS
  Filled 2022-02-22 (×3): qty 2

## 2022-02-22 NOTE — ED Notes (Signed)
Pt returned from CT at this time.  

## 2022-02-22 NOTE — H&P (Signed)
?  ?  ?[facility] ? ? ?PATIENT NAME: Vincent Koch   ? ?MR#:  [id number] ? ?DATE OF BIRTH:  05/20/1934 ? ?DATE OF ADMISSION:  02/21/2022 ? ?PRIMARY CARE PHYSICIAN: Charlynne Cousins, MD  ? ?Patient is coming from: Home ? ?REQUESTING/REFERRING PHYSICIAN: Ane Payment, MD ? ?CHIEF COMPLAINT:  ? ?Chief Complaint  ?Patient presents with  ? Abdominal Pain  ? ? ?HISTORY OF PRESENT ILLNESS:  ?Vincent Koch is a 88 y.o. Caucasian male with medical history significant for dyslipidemia, hypertension and hypothyroidism, who presented to the emergency room with acute onset of generalized abdominal pain over the last couple weeks with associated diarrhea without melena or bright red blood per rectum as well as nausea and significant diminished appetite.  He denies any vomiting.  No fever or chills.  No dysuria, oliguria or hematuria or flank pain.  No chest pain or palpitations. ? ?ED Course: When he came to the ER vital signs were within normal.  Labs revealed a sodium of 131 with CO2 of 18 and glucose 166 with a creatinine 1.74, albumin of 3.4 with total protein of 7.5.  CBC showed significant leukocytosis 28.3 lactic acid was 1.5.  UA showed 30 protein. ?EKG as reviewed by me : Normal sinus rhythm with a rate of 91 with RVH ?Imaging: Abdominal pelvic CT scan with contrast revealed the following: ?1. Diffuse colonic wall thickening likely representing colitis. ?2. Small amount of free fluid in the pelvis and mesentery is likely ?reactive. ?3. Emphysematous changes in the lung bases. ?4. Large esophageal hiatal hernia. ?5. Fatty infiltration of the liver. ?6. Nonobstructing stones in the left kidney. ?7. Aortic atherosclerosis. ?8. Enlarged prostate gland. ?9. Small right inguinal hernia containing fat. ? ?The patient was given IV Zosyn as well as 500 mill IV normal saline bolus, 4 mg of IV Zofran and 4 mg of IV morphine sulfate.  He will be admitted to a medical bed for further evaluation and management. ?  ?PAST MEDICAL  HISTORY:  ?Hypertension, dyslipidemia and hypothyroidism. ? ?PAST SURGICAL HISTORY:  ?History reviewed. No pertinent surgical history.  He denies any previous surgeries. ? ?SOCIAL HISTORY:  ? ?Social History  ? ?Tobacco Use  ? Smoking status: Never  ? Smokeless tobacco: Never  ?Substance Use Topics  ? Alcohol use: Not on file  ?No history of tobacco or EtOH abuse or illicit drug use. ? ?FAMILY HISTORY:  ?History reviewed. No pertinent family history. ?He denies any familial diseases. ?DRUG ALLERGIES:  ?No Known Allergies ? ?REVIEW OF SYSTEMS:  ? ?ROS ?As per history of present illness. All pertinent systems were reviewed above. Constitutional, HEENT, cardiovascular, respiratory, GI, GU, musculoskeletal, neuro, psychiatric, endocrine, integumentary and hematologic systems were reviewed and are otherwise negative/unremarkable except for positive findings mentioned above in the HPI. ? ? ?MEDICATIONS AT HOME:  ? ?Prior to Admission medications   ?Not on File  ? ?  ? ?VITAL SIGNS:  ?Blood pressure 125/62, pulse 80, temperature 98.1 ?F (36.7 ?C), temperature source Oral, resp. rate 14, height '5\' 10"'$  (1.778 m), weight 73.5 kg, SpO2 91 %. ? ?PHYSICAL EXAMINATION:  ?Physical Exam ? ?GENERAL:  88 y.o.-year-old Caucasian male patient lying in the bed with no acute distress.  ?EYES: Pupils equal, round, reactive to light and accommodation. No scleral icterus. Extraocular muscles intact.  ?HEENT: Head atraumatic, normocephalic. Oropharynx and nasopharynx clear.  ?NECK:  Supple, no jugular venous distention. No thyroid enlargement, no tenderness.  ?LUNGS: Normal breath sounds bilaterally, no wheezing, rales,rhonchi or crepitation. No  use of accessory muscles of respiration.  ?CARDIOVASCULAR: Regular rate and rhythm, S1, S2 normal. No murmurs, rubs, or gallops.  ?ABDOMEN: Soft, nondistended, with generalized abdominal tenderness without rebound tenderness guarding or rigidity.  Bowel sounds present. No organomegaly or mass.   ?EXTREMITIES: No pedal edema, cyanosis, or clubbing.  ?NEUROLOGIC: Cranial nerves II through XII are intact. Muscle strength 5/5 in all extremities. Sensation intact. Gait not checked.  ?PSYCHIATRIC: The patient is alert and oriented x 3.  Normal affect and good eye contact. ?SKIN: No obvious rash, lesion, or ulcer.  ? ?LABORATORY PANEL:  ? ?CBC ?Recent Labs  ?Lab 02/21/22 ?2238  ?WBC 28.3*  ?HGB 14.6  ?HCT 43.5  ?PLT 379  ? ?------------------------------------------------------------------------------------------------------------------ ? ?Chemistries  ?Recent Labs  ?Lab 02/21/22 ?2238  ?NA 131*  ?K 3.9  ?CL 100  ?CO2 18*  ?GLUCOSE 166*  ?BUN 21  ?CREATININE 1.74*  ?CALCIUM 8.9  ?AST 21  ?ALT 18  ?ALKPHOS 72  ?BILITOT 0.9  ? ?------------------------------------------------------------------------------------------------------------------ ? ?Cardiac Enzymes ?No results for input(s): TROPONINI in the last 168 hours. ?------------------------------------------------------------------------------------------------------------------ ? ?RADIOLOGY:  ?CT ABDOMEN PELVIS W CONTRAST ? ?Result Date: 02/22/2022 ?CLINICAL DATA:  Acute nonlocalized abdominal pain since Thursday. Nausea and diarrhea. EXAM: CT ABDOMEN AND PELVIS WITH CONTRAST TECHNIQUE: Multidetector CT imaging of the abdomen and pelvis was performed using the standard protocol following bolus administration of intravenous contrast. RADIATION DOSE REDUCTION: This exam was performed according to the departmental dose-optimization program which includes automated exposure control, adjustment of the mA and/or kV according to patient size and/or use of iterative reconstruction technique. CONTRAST:  75m OMNIPAQUE IOHEXOL 300 MG/ML  SOLN COMPARISON:  None. FINDINGS: Lower chest: Emphysematous changes in the lung bases. Large esophageal hiatal hernia. Hepatobiliary: Diffuse fatty infiltration of the liver. No focal lesions. Gallbladder and bile ducts are normal.  Pancreas: Unremarkable. No pancreatic ductal dilatation or surrounding inflammatory changes. Spleen: Normal in size without focal abnormality. Adrenals/Urinary Tract: No adrenal gland nodules. Kidneys are symmetrical. No hydronephrosis or hydroureter. Several stones in the left kidney, largest measuring 7 mm diameter. No ureteral stones. Bladder is normal. Stomach/Bowel: Stomach, small bowel, and colon are not abnormally distended. Diffuse wall thickening suggested throughout the colon likely representing colitis. Consider infectious or inflammatory etiologies. Possibly pseudomembranous colitis. Diverticulosis of the sigmoid colon. Appendix is not identified. Vascular/Lymphatic: Aortic atherosclerosis. No enlarged abdominal or pelvic lymph nodes. Reproductive: Prostate gland is mildly enlarged. Other: Small amount of free fluid in the pelvis and mesentery is likely reactive. No abscess. Small right inguinal hernia containing fat. Musculoskeletal: No acute or significant osseous findings. IMPRESSION: 1. Diffuse colonic wall thickening likely representing colitis. 2. Small amount of free fluid in the pelvis and mesentery is likely reactive. 3. Emphysematous changes in the lung bases. 4. Large esophageal hiatal hernia. 5. Fatty infiltration of the liver. 6. Nonobstructing stones in the left kidney. 7. Aortic atherosclerosis. 8. Enlarged prostate gland. 9. Small right inguinal hernia containing fat. Electronically Signed   By: WLucienne CapersM.D.   On: 02/22/2022 01:38   ? ? ? ?IMPRESSION AND PLAN:  ?Assessment and Plan: ?* Acute colitis ?- The patient will be admitted to a medical bed. ?- We will continue antibiotic therapy with IV Rocephin and Flagyl. ?- Pain management will be provided. ?- He will be placed on clear liquids and hydrated with IV normal saline. ? ?Sepsis (HEmpire ?- This is clearly secondary to his acute diffuse colitis. ?- This is manifested by his significant leukocytosis as well as heart rate  of  97. ?- He will be hydrated with IV normal saline. ?- He will be on IV Rocephin and Flagyl as mentioned above. ?- We will follow his blood cultures. ?- We will follow a significant leukocytosis. ? ?AKI (acute kidney inj

## 2022-02-22 NOTE — Assessment & Plan Note (Addendum)
Patient met sepsis criteria with marked leukocytosis and tachycardia secondary to C. difficile colitis.  Blood cultures remain negative ?Please see above for colitis. ?

## 2022-02-22 NOTE — Assessment & Plan Note (Addendum)
Patient was admitted with acute colitis secondary to C. Difficile. ?Initially received ceftriaxone and Flagyl and later switched to p.o. vancomycin and Flagyl.  Clinically seems improving. ?Leukocytosis, abdominal pain and diarrhea improving. ?Tolerating full liquid diet well, no nausea or vomiting and wants to eat solid. ?-Advance diet ?-Continue with p.o. vancomycin and Flagyl. ?-Continue with IV fluid for another day ?-Continue with supportive care ?-PT evaluation ?

## 2022-02-22 NOTE — Assessment & Plan Note (Signed)
-   We will continue Synthroid. 

## 2022-02-22 NOTE — Assessment & Plan Note (Addendum)
Blood pressure within goal. ?Home dose of Zestril is being held for concern of AKI. ?-Continue holding Zestril. ?-Continue with as needed hydralazine ?

## 2022-02-22 NOTE — Assessment & Plan Note (Addendum)
-   Home dose of fenofibrate is being held due to colitis. ?

## 2022-02-22 NOTE — Telephone Encounter (Signed)
Ana with [facility] states that the patient only returned part of stool specimen to the lab at the end of last week. Called patient to see if the other container could be brought in with a sample for testing, LVM for patient to please return my call.  ?

## 2022-02-22 NOTE — Progress Notes (Signed)
This is a no charge note as patient was admitted this AM.  Patient seen and examined H&P reviewed. ? ? ?Vincent Koch is a 88 y.o. Caucasian male with medical history significant for dyslipidemia, hypertension and hypothyroidism, who presented to the emergency room with acute onset of generalized abdominal pain over the last couple weeks with associated diarrhea without melena or bright red blood per rectum as well as nausea and significant diminished  ? ?Abdominal pelvic CT scan with contrast revealed the following: ?1. Diffuse colonic wall thickening likely representing colitis. ? ?NAD, DMM ?Cta  ?Regular s1/s2  ?Soft nt/nd +bs ? ? ?A/P: ?Continue iv abx ?Continue ivf ?Obtain stool studies, c.diff to r/o infectious etiology ?Last cscope several years ago, may consider gi consult ?

## 2022-02-22 NOTE — Telephone Encounter (Signed)
Patient's wife returned my call. Discussed the specimen with her. She states that the patient was only able to do 2 of the stool collections and not all 3. The one that was not completed is still at home per the patient's wife. Patient's wife also stated that the patient is currently at the hospital. She states that the patient was in a lot of pain yesterday so she took him to the ER. She states that he is currently at [facility]. She states that the patient will not be able to complete the studies. She states that they are doing tests at the hospital and feels that he will not need the studies from our office.  ? ? ?

## 2022-02-22 NOTE — TOC Benefit Eligibility Note (Addendum)
Patient Advocate Encounter ? ?Insurance verification completed.   ? ?The patient is currently admitted and upon discharge could be taking Dificid 200 mg tablets. ? ?The current 10 day co-pay is, $1,553.02.  ? ?The patient is currently admitted and upon discharge could be taking Vancomycin 125 mg capsules. ? ?The current 10 day co-pay is, $50.94.  ? ? ?The patient is insured through [facility] Part D  ? ? ? ?Lyndel Safe, CPhT ?Pharmacy Patient Advocate Specialist ?[place] Patient Advocate Team ?Direct Number: [phone]  Fax: [phone] ? ? ? ? ? ?  ?

## 2022-02-22 NOTE — Assessment & Plan Note (Addendum)
Creatinine at 1.7 on admission.  Now improved to 1.5 with IV fluid. ?Apparently patient has an history of CKD stage IIIb with baseline creatinine around 1.4-1.5 and follow-up with [facility] regularly.  Last creatinine check was 1.8 and his nephrologist advised to hold lisinopril due to worsening renal function. ?-Continue with IV fluid for another day. ?-Monitor renal function ?-Avoid nephrotoxins ?

## 2022-02-22 NOTE — Assessment & Plan Note (Signed)
-   We will continue PPI therapy 

## 2022-02-23 DIAGNOSIS — K529 Noninfective gastroenteritis and colitis, unspecified: Secondary | ICD-10-CM | POA: Diagnosis not present

## 2022-02-23 LAB — CBC
HCT: 39.2 % (ref 39.0–52.0)
Hemoglobin: 12.8 g/dL — ABNORMAL LOW (ref 13.0–17.0)
MCH: 30 pg (ref 26.0–34.0)
MCHC: 32.7 g/dL (ref 30.0–36.0)
MCV: 92 fL (ref 80.0–100.0)
Platelets: 320 10*3/uL (ref 150–400)
RBC: 4.26 MIL/uL (ref 4.22–5.81)
RDW: 13.6 % (ref 11.5–15.5)
WBC: 27.7 10*3/uL — ABNORMAL HIGH (ref 4.0–10.5)
nRBC: 0 % (ref 0.0–0.2)

## 2022-02-23 NOTE — Progress Notes (Signed)
Can you please check and see which hospital the pt is at dont seen anything in epic. Thnx.

## 2022-02-23 NOTE — Progress Notes (Signed)
?PROGRESS NOTE ? ? ? Vincent Koch  QPY:[id number] DOB: Mar 22, 1934 DOA: 02/21/2022 ?PCP: Charlynne Cousins, MD  ? ? ?Brief Narrative:  ?Vincent Koch is a 88 y.o. Caucasian male with medical history significant for dyslipidemia, hypertension and hypothyroidism, who presented to the emergency room with acute onset of generalized abdominal pain over the last couple weeks with associated diarrhea without melena or bright red blood per rectum as well as nausea and significant diminished appetite. CT scan with contrast revealed the following: ?1. Diffuse colonic wall thickening likely representing colitis. ?C.diff positive . Started on abx and ivf. ? ?4/4 wants diet to be advanced. Tolerated clears. Will advance to full ? ? ? ? ?Consultants:  ? ? ?Procedures:  ? ?Antimicrobials:  ?Vancomycin po and metroni. iv  ? ? ?Subjective: ?Feels ok. Feels abd going down. 2 diarrhea since this am. No n/v ? ?Objective: ?Vitals:  ? 02/22/22 1601 02/22/22 1935 02/23/22 0507 02/23/22 1245  ?BP: (!) 101/45 (!) 115/58 113/63 119/64  ?Pulse: 80 86 71 70  ?Resp: 18 (!) '21 20 16  '$ ?Temp: 99.2 ?F (37.3 ?C) 98 ?F (36.7 ?C) 98.1 ?F (36.7 ?C) 98.1 ?F (36.7 ?C)  ?TempSrc:  Oral    ?SpO2: 94% 94% 94% 96%  ?Weight:      ?Height:      ? ?No intake or output data in the 24 hours ending 02/23/22 1151 ?Filed Weights  ? 02/21/22 2232  ?Weight: 73.5 kg  ? ? ?Examination: ?Calm, NAD ?Cta no w/r ?Reg s1/s2 no gallop ?Soft distended, +bs ?No edema ?Aaoxox3  ?Mood and affect appropriate in current setting  ? ? ? ?Data Reviewed: I have personally reviewed following labs and imaging studies ? ?CBC: ?Recent Labs  ?Lab 02/21/22 ?2238 02/22/22 ?0500 02/23/22 ?0904  ?WBC 28.3* 26.0* 27.7*  ?HGB 14.6 13.3 12.8*  ?HCT 43.5 40.2 39.2  ?MCV 90.8 91.4 92.0  ?PLT 379 325 320  ? ?Basic Metabolic Panel: ?Recent Labs  ?Lab 02/21/22 ?2238 02/22/22 ?1227  ?NA 131* 135  ?K 3.9 3.9  ?CL 100 98  ?CO2 18* 22  ?GLUCOSE 166* 167*  ?BUN 21 20  ?CREATININE 1.74* 1.74*  ?CALCIUM 8.9 8.7*   ? ?GFR: ?Estimated Creatinine Clearance: 32 mL/min (A) (by C-G formula based on SCr of 1.74 mg/dL (H)). ?Liver Function Tests: ?Recent Labs  ?Lab 02/21/22 ?2238  ?AST 21  ?ALT 18  ?ALKPHOS 72  ?BILITOT 0.9  ?PROT 7.5  ?ALBUMIN 3.4*  ? ?Recent Labs  ?Lab 02/21/22 ?2238  ?LIPASE 48  ? ?No results for input(s): AMMONIA in the last 168 hours. ?Coagulation Profile: ?Recent Labs  ?Lab 02/22/22 ?0500  ?INR 1.2  ? ?Cardiac Enzymes: ?No results for input(s): CKTOTAL, CKMB, CKMBINDEX, TROPONINI in the last 168 hours. ?BNP (last 3 results) ?No results for input(s): PROBNP in the last 8760 hours. ?HbA1C: ?No results for input(s): HGBA1C in the last 72 hours. ?CBG: ?No results for input(s): GLUCAP in the last 168 hours. ?Lipid Profile: ?No results for input(s): CHOL, HDL, LDLCALC, TRIG, CHOLHDL, LDLDIRECT in the last 72 hours. ?Thyroid Function Tests: ?No results for input(s): TSH, T4TOTAL, FREET4, T3FREE, THYROIDAB in the last 72 hours. ?Anemia Panel: ?No results for input(s): VITAMINB12, FOLATE, FERRITIN, TIBC, IRON, RETICCTPCT in the last 72 hours. ?Sepsis Labs: ?Recent Labs  ?Lab 02/21/22 ?2323 02/22/22 ?1227  ?PROCALCITON  --  0.22  ?LATICACIDVEN 1.5  --   ? ? ?Recent Results (from the past 240 hour(s))  ?CULTURE, BLOOD (ROUTINE X 2) w Reflex to ID Panel  Status: None (Preliminary result)  ? Collection Time: 02/22/22  4:35 AM  ? Specimen: BLOOD  ?Result Value Ref Range Status  ? Specimen Description BLOOD BLOOD RIGHT FOREARM  Final  ? Special Requests   Final  ?  BOTTLES DRAWN AEROBIC AND ANAEROBIC Blood Culture adequate volume  ? Culture   Final  ?  NO GROWTH < 24 HOURS ?Performed at [facility], [address], [place], [place] [address] ?  ? Report Status PENDING  Incomplete  ?CULTURE, BLOOD (ROUTINE X 2) w Reflex to ID Panel     Status: None (Preliminary result)  ? Collection Time: 02/22/22  4:40 AM  ? Specimen: BLOOD  ?Result Value Ref Range Status  ? Specimen Description BLOOD BLOOD RIGHT WRIST  Final   ? Special Requests   Final  ?  BOTTLES DRAWN AEROBIC AND ANAEROBIC Blood Culture results may not be optimal due to an inadequate volume of blood received in culture bottles  ? Culture   Final  ?  NO GROWTH < 24 HOURS ?Performed at [facility], [address], [place], [place] [address] ?  ? Report Status PENDING  Incomplete  ?Gastrointestinal Panel by PCR , Stool     Status: None  ? Collection Time: 02/22/22 10:52 AM  ? Specimen: Stool  ?Result Value Ref Range Status  ? Campylobacter species NOT DETECTED NOT DETECTED Final  ? Plesimonas shigelloides NOT DETECTED NOT DETECTED Final  ? Salmonella species NOT DETECTED NOT DETECTED Final  ? Yersinia enterocolitica NOT DETECTED NOT DETECTED Final  ? Vibrio species NOT DETECTED NOT DETECTED Final  ? Vibrio cholerae NOT DETECTED NOT DETECTED Final  ? Enteroaggregative E coli (EAEC) NOT DETECTED NOT DETECTED Final  ? Enteropathogenic E coli (EPEC) NOT DETECTED NOT DETECTED Final  ? Enterotoxigenic E coli (ETEC) NOT DETECTED NOT DETECTED Final  ? Shiga like toxin producing E coli (STEC) NOT DETECTED NOT DETECTED Final  ? Shigella/Enteroinvasive E coli (EIEC) NOT DETECTED NOT DETECTED Final  ? Cryptosporidium NOT DETECTED NOT DETECTED Final  ? Cyclospora cayetanensis NOT DETECTED NOT DETECTED Final  ? Entamoeba histolytica NOT DETECTED NOT DETECTED Final  ? Giardia lamblia NOT DETECTED NOT DETECTED Final  ? Adenovirus F40/41 NOT DETECTED NOT DETECTED Final  ? Astrovirus NOT DETECTED NOT DETECTED Final  ? Norovirus GI/GII NOT DETECTED NOT DETECTED Final  ? Rotavirus A NOT DETECTED NOT DETECTED Final  ? Sapovirus (I, II, IV, and V) NOT DETECTED NOT DETECTED Final  ?  Comment: Performed at [facility], [address] [place], [place] [address]  ?C Difficile Quick Screen w PCR reflex     Status: Abnormal  ? Collection Time: 02/22/22 10:52 AM  ? Specimen: STOOL  ?Result Value Ref Range Status  ? C Diff antigen POSITIVE (A) NEGATIVE Final  ? C Diff toxin  POSITIVE (A) NEGATIVE Final  ? C Diff interpretation Toxin producing C. difficile detected.  Final  ?  Comment: CRITICAL RESULT CALLED TO, READ BACK BY AND VERIFIED WITH: ?Charlynn Court RN [id number] 02/22/22 HNM ?Performed at [facility], [address], [place], [place] [address] ?  ?  ? ? ? ? ? ?Radiology Studies: ?CT ABDOMEN PELVIS W CONTRAST ? ?Result Date: 02/22/2022 ?CLINICAL DATA:  Acute nonlocalized abdominal pain since Thursday. Nausea and diarrhea. EXAM: CT ABDOMEN AND PELVIS WITH CONTRAST TECHNIQUE: Multidetector CT imaging of the abdomen and pelvis was performed using the standard protocol following bolus administration of intravenous contrast. RADIATION DOSE REDUCTION: This exam  was performed according to the departmental dose-optimization program which includes automated exposure control, adjustment of the mA and/or kV according to patient size and/or use of iterative reconstruction technique. CONTRAST:  2m OMNIPAQUE IOHEXOL 300 MG/ML  SOLN COMPARISON:  None. FINDINGS: Lower chest: Emphysematous changes in the lung bases. Large esophageal hiatal hernia. Hepatobiliary: Diffuse fatty infiltration of the liver. No focal lesions. Gallbladder and bile ducts are normal. Pancreas: Unremarkable. No pancreatic ductal dilatation or surrounding inflammatory changes. Spleen: Normal in size without focal abnormality. Adrenals/Urinary Tract: No adrenal gland nodules. Kidneys are symmetrical. No hydronephrosis or hydroureter. Several stones in the left kidney, largest measuring 7 mm diameter. No ureteral stones. Bladder is normal. Stomach/Bowel: Stomach, small bowel, and colon are not abnormally distended. Diffuse wall thickening suggested throughout the colon likely representing colitis. Consider infectious or inflammatory etiologies. Possibly pseudomembranous colitis. Diverticulosis of the sigmoid colon. Appendix is not identified. Vascular/Lymphatic: Aortic atherosclerosis. No enlarged abdominal or pelvic  lymph nodes. Reproductive: Prostate gland is mildly enlarged. Other: Small amount of free fluid in the pelvis and mesentery is likely reactive. No abscess. Small right inguinal hernia containing fat. Muscul

## 2022-02-24 DIAGNOSIS — K529 Noninfective gastroenteritis and colitis, unspecified: Secondary | ICD-10-CM | POA: Diagnosis not present

## 2022-02-24 LAB — BASIC METABOLIC PANEL
Anion gap: 6 (ref 5–15)
BUN: 17 mg/dL (ref 8–23)
CO2: 22 mmol/L (ref 22–32)
Calcium: 7.7 mg/dL — ABNORMAL LOW (ref 8.9–10.3)
Chloride: 106 mmol/L (ref 98–111)
Creatinine, Ser: 1.5 mg/dL — ABNORMAL HIGH (ref 0.61–1.24)
GFR, Estimated: 45 mL/min — ABNORMAL LOW (ref >60)
Glucose, Bld: 126 mg/dL — ABNORMAL HIGH (ref 70–99)
Potassium: 3.7 mmol/L (ref 3.5–5.1)
Sodium: 134 mmol/L — ABNORMAL LOW (ref 135–145)

## 2022-02-24 LAB — CBC
HCT: 35.9 % — ABNORMAL LOW (ref 39.0–52.0)
Hemoglobin: 11.8 g/dL — ABNORMAL LOW (ref 13.0–17.0)
MCH: 30 pg (ref 26.0–34.0)
MCHC: 32.9 g/dL (ref 30.0–36.0)
MCV: 91.3 fL (ref 80.0–100.0)
Platelets: 333 10*3/uL (ref 150–400)
RBC: 3.93 MIL/uL — ABNORMAL LOW (ref 4.22–5.81)
RDW: 13.5 % (ref 11.5–15.5)
WBC: 14.6 10*3/uL — ABNORMAL HIGH (ref 4.0–10.5)
nRBC: 0 % (ref 0.0–0.2)

## 2022-02-24 LAB — STOOL CULTURE: E coli, Shiga toxin Assay: NEGATIVE

## 2022-02-24 NOTE — Plan of Care (Signed)

## 2022-02-24 NOTE — Hospital Course (Addendum)
Taken from prior notes. ? ?Vincent Koch is a 88 y.o. Caucasian male with medical history significant for dyslipidemia, hypertension and hypothyroidism, who presented to the emergency room with acute onset of generalized abdominal pain over the last couple weeks with associated diarrhea without melena or bright red blood per rectum as well as nausea and significant diminished appetite. CT scan with contrast revealed the following: ?1. Diffuse colonic wall thickening likely representing colitis. ?C.diff positive . Started on abx and ivf. ? ?Patient was also found to have elevated creatinine around 1.7, no known baseline. ?Creatinine now started improving with IV fluid. ?After talking with wife it was found to have he has CKD stage IIIb, there is another chart which need to be merge in the system.  His baseline creatinine seems to be around 1.4-1.5, with recent worsening his nephrologist advised to hold lisinopril.  He follow-up with [facility].  His last creatinine in the system was 1.8. ? ?Per wife no recent antibiotic exposure and no prior history of C. difficile colitis. ? ?Marked leukocytosis which started improving. ? ?Patient was tolerating full liquid diet today and looking forward to eat solid, diet was advanced. ?Clinically seems improving.  Still had 2 episodes of diarrhea since morning, but stating that very small in quantity. ? ?Patient remained stable, still having loose bowel movements but stating that it is becoming more formed as compared to before.  No abdominal pain, no nausea or vomiting.  Able to tolerate regular diet.  He wants to go home.  Leukocytosis improving, renal function improved, better than his prior baseline, creatinine at 1.34 today.  Having mild hypokalemia which was repleted.  Patient was advised to drink rehydration fluid. ? ?He was given 7 more days of p.o. vancomycin and Flagyl and advised to follow-up with primary care provider for further recommendations and repeat  labs. ? ?He will continue with rest of his home medications and follow-up with his providers. ?

## 2022-02-24 NOTE — Progress Notes (Signed)
?Progress Note ? ? ?Patient: Vincent Koch VFI:[id number] DOB: 1933-12-27 DOA: 02/21/2022     2 ?DOS: the patient was seen and examined on 02/24/2022 ?  ?Brief hospital course: ?Taken from prior notes. ? ?Vincent Koch is a 88 y.o. Caucasian male with medical history significant for dyslipidemia, hypertension and hypothyroidism, who presented to the emergency room with acute onset of generalized abdominal pain over the last couple weeks with associated diarrhea without melena or bright red blood per rectum as well as nausea and significant diminished appetite. CT scan with contrast revealed the following: ?1. Diffuse colonic wall thickening likely representing colitis. ?C.diff positive . Started on abx and ivf. ? ?Patient was also found to have elevated creatinine around 1.7, no known baseline. ?Creatinine now started improving with IV fluid. ?After talking with wife it was found to have he has CKD stage IIIb, there is another chart which need to be merge in the system.  His baseline creatinine seems to be around 1.4-1.5, with recent worsening his nephrologist advised to hold lisinopril.  He follow-up with [facility].  His last creatinine in the system was 1.8. ? ?Per wife no recent antibiotic exposure and no prior history of C. difficile colitis. ? ?Marked leukocytosis which started improving. ? ?Patient was tolerating full liquid diet today and looking forward to eat solid, diet was advanced. ?Clinically seems improving.  Still had 2 episodes of diarrhea since morning, but stating that very small in quantity. ? ?If continue to improve he will be discharge home in 1 to 2 days. ?Getting PT evaluation further recommendations. ? ? ?Assessment and Plan: ?* Acute colitis ?Patient was admitted with acute colitis secondary to C. Difficile. ?Initially received ceftriaxone and Flagyl and later switched to p.o. vancomycin and Flagyl.  Clinically seems improving. ?Leukocytosis, abdominal pain and diarrhea  improving. ?Tolerating full liquid diet well, no nausea or vomiting and wants to eat solid. ?-Advance diet ?-Continue with p.o. vancomycin and Flagyl. ?-Continue with IV fluid for another day ?-Continue with supportive care ?-PT evaluation ? ?Sepsis ([place]) ?Patient met sepsis criteria with marked leukocytosis and tachycardia secondary to C. difficile colitis.  Blood cultures remain negative ?Please see above for colitis. ? ?AKI (acute kidney injury) ([place]) ?Creatinine at 1.7 on admission.  Now improved to 1.5 with IV fluid. ?Apparently patient has an history of CKD stage IIIb with baseline creatinine around 1.4-1.5 and follow-up with [facility] regularly.  Last creatinine check was 1.8 and his nephrologist advised to hold lisinopril due to worsening renal function. ?-Continue with IV fluid for another day. ?-Monitor renal function ?-Avoid nephrotoxins ? ?Dyslipidemia ?- Home dose of fenofibrate is being held due to colitis. ? ?Hypothyroidism ?- We will continue Synthroid. ? ?GERD (gastroesophageal reflux disease) ?- We will continue PPI therapy. ? ?Essential hypertension ?Blood pressure within goal. ?Home dose of Zestril is being held for concern of AKI. ?-Continue holding Zestril. ?-Continue with as needed hydralazine ? ?Subjective: Patient was feeling much improved when seen today.  Abdominal pain has been improved, no nausea or vomiting.  He had 2 bowel movements since this morning, stating that they were very small in quantity as compared to before.  Denies any more bleeding per rectum. ?Tolerating full liquid diet and asking to advance so he can eat some solids. ? ?Physical Exam: ?Vitals:  ? 02/23/22 1208 02/24/22 0446 02/24/22 0752 02/24/22 1138  ?BP: 124/61 (!) 107/40 112/65 124/66  ?Pulse: 77 69 64 66  ?Resp: 16 20 16 18   ?Temp: 98.4 ?F (36.9 ?  C) 98 ?F (36.7 ?C) 98.5 ?F (36.9 ?C) 98 ?F (36.7 ?C)  ?TempSrc:  Oral    ?SpO2: 96% 94% 93% 97%  ?Weight:      ?Height:      ? ?General.  Well-developed elderly  man, in no acute distress. ?Pulmonary.  Lungs clear bilaterally, normal respiratory effort. ?CV.  Regular rate and rhythm, no JVD, rub or murmur. ?Abdomen.  Soft, nontender, nondistended, BS positive. ?CNS.  Alert and oriented .  No focal neurologic deficit. ?Extremities.  No edema, no cyanosis, pulses intact and symmetrical. ?Psychiatry.  Judgment and insight appears normal. ? ?Data Reviewed: ?Prior notes, labs and images reviewed ? ?Family Communication: Discussed with wife on phone. ? ?Disposition: ?Status is: Inpatient ?Remains inpatient appropriate because: Severity of illness ? ? Planned Discharge Destination: Home ? ?DVT prophylaxis.  Lovenox ? ?Time spent: 45 minutes ? ?This record has been created using Systems analyst. Errors have been sought and corrected,but may not always be located. Such creation errors do not reflect on the standard of care. ? ?Author: ?Lorella Nimrod, MD ?02/24/2022 2:38 PM ? ?For on call review [email or web address].  ?

## 2022-02-25 DIAGNOSIS — K529 Noninfective gastroenteritis and colitis, unspecified: Secondary | ICD-10-CM | POA: Diagnosis not present

## 2022-02-25 LAB — CBC
HCT: 38.3 % — ABNORMAL LOW (ref 39.0–52.0)
Hemoglobin: 12.6 g/dL — ABNORMAL LOW (ref 13.0–17.0)
MCH: 29.8 pg (ref 26.0–34.0)
MCHC: 32.9 g/dL (ref 30.0–36.0)
MCV: 90.5 fL (ref 80.0–100.0)
Platelets: 364 10*3/uL (ref 150–400)
RBC: 4.23 MIL/uL (ref 4.22–5.81)
RDW: 13.6 % (ref 11.5–15.5)
WBC: 12.1 10*3/uL — ABNORMAL HIGH (ref 4.0–10.5)
nRBC: 0 % (ref 0.0–0.2)

## 2022-02-25 LAB — BASIC METABOLIC PANEL
Anion gap: 7 (ref 5–15)
BUN: 15 mg/dL (ref 8–23)
CO2: 21 mmol/L — ABNORMAL LOW (ref 22–32)
Calcium: 7.9 mg/dL — ABNORMAL LOW (ref 8.9–10.3)
Chloride: 107 mmol/L (ref 98–111)
Creatinine, Ser: 1.34 mg/dL — ABNORMAL HIGH (ref 0.61–1.24)
GFR, Estimated: 52 mL/min — ABNORMAL LOW (ref >60)
Glucose, Bld: 185 mg/dL — ABNORMAL HIGH (ref 70–99)
Potassium: 3.3 mmol/L — ABNORMAL LOW (ref 3.5–5.1)
Sodium: 135 mmol/L (ref 135–145)

## 2022-02-25 MED ORDER — POTASSIUM CHLORIDE CRYS ER 20 MEQ PO TBCR
40.0000 meq | EXTENDED_RELEASE_TABLET | Freq: Once | ORAL | Status: AC
  Administered 2022-02-25: 40 meq via ORAL
  Filled 2022-02-25: qty 2

## 2022-02-25 MED ORDER — PANTOPRAZOLE SODIUM 40 MG PO TBEC
40.0000 mg | DELAYED_RELEASE_TABLET | Freq: Every day | ORAL | Status: DC
Start: 2022-02-26 — End: 2022-02-25

## 2022-02-25 MED ORDER — VANCOMYCIN HCL 125 MG PO CAPS: 125.0000 mg | ORAL_CAPSULE | Freq: Four times a day (QID) | ORAL | 0 refills | Status: AC

## 2022-02-25 MED ORDER — RISAQUAD PO CAPS: 2.0000 | ORAL_CAPSULE | Freq: Three times a day (TID) | ORAL | 0 refills | Status: DC

## 2022-02-25 MED ORDER — FENOFIBRATE 145 MG PO TABS
145.0000 mg | ORAL_TABLET | Freq: Every day | ORAL | Status: DC
Start: 2022-02-25 — End: 2022-12-10

## 2022-02-25 MED ORDER — ONDANSETRON HCL 4 MG PO TABS: 4.0000 mg | ORAL_TABLET | Freq: Four times a day (QID) | ORAL | 0 refills | Status: DC | PRN

## 2022-02-25 MED ORDER — METRONIDAZOLE 500 MG PO TABS: 500.0000 mg | ORAL_TABLET | Freq: Three times a day (TID) | ORAL | 0 refills | Status: AC

## 2022-02-25 NOTE — Progress Notes (Signed)
Received MD order to discharge patient to home, reviewed discharge instructions, home meds, prescriptions and follow up appointments with patient and wife Patsy and both verbalized understanding.   ?

## 2022-02-25 NOTE — Evaluation (Signed)
Physical Therapy Evaluation ?Patient Details ?Name: Vincent Koch ?MRN: [id number] ?DOB: 28-Sep-1934 ?Today's Date: 02/25/2022 ? ?History of Present Illness ? Patient is an 88 year old male with a PMH (+) for dyslipidemia, hypertension and hypothyroidism, who presented to the emergency room on 02/21/22 with acute onset of generalized abdominal pain over the last couple weeks with associated diarrhea without bright red blood per rectum as well as nausea and significant diminished appetite. CT scan with contrast revealed diffuse colonic wall thickening likely representing colitis, and diff positive. ?  ?Clinical Impression ? Physical Therapy Evaluation completed this date. Patient tolerated session well and was agreeable to treatment. Patient reported no pain throughout entire session, and states he was Mod I/ Independent with all ADLs and mobility prior to hospitalization. Patient lives in a 1 level house with his wife and 3 STE with B hand rails. Patient loves staying active during the day and working in his garden.  ? ?During session patient demonstrated [facility] strength for BUE and BLE. No physical assistance was required for bed mobility, sit to stand transfers, ambulation without an AD, and steps. All functional activity was completed at [facility] I/ Independent level. Patient is demonstrating at/near baseline level of function, and does not require continued skilled physical therapy at this time. Signing off.  ?   ? ?Recommendations for follow up therapy are one component of a multi-disciplinary discharge planning process, led by the attending physician.  Recommendations may be updated based on patient status, additional functional criteria and insurance authorization. ? ?Follow Up Recommendations No PT follow up ? ?  ?Assistance Recommended at Discharge None  ?Patient can return home with the following ? Help with stairs or ramp for entrance ? ?  ?Equipment Recommendations None recommended by PT (at this time)   ?Recommendations for Other Services ?    ?  ?Functional Status Assessment Patient has not had a recent decline in their functional status  ? ?  ?Precautions / Restrictions Precautions ?Precautions: Fall ?Restrictions ?Weight Bearing Restrictions: No  ? ?  ? ?Mobility ? Bed Mobility ?Overal bed mobility: Independent ?  ?  ?  ?  ?  ?  ?  ?Patient Response: Cooperative ? ?Transfers ?Overall transfer level: Modified independent ?Equipment used: None ?  ?  ?  ?  ?  ?  ?  ?  ?  ? ?Ambulation/Gait ?Ambulation/Gait assistance: Modified independent (Device/Increase time) ?Gait Distance (Feet): 320 Feet ?Assistive device: None ?Gait Pattern/deviations: WFL(Within Functional Limits) ?  ?Gait velocity interpretation: >2.62 ft/sec, indicative of community ambulatory ?  ?General Gait Details: no LOB noted, patient demonstrated ability to hold a conversation and demonstrate good obstacle negotiation when ambulation around the nurses station ? ?Stairs ?Stairs: Yes ?Stairs assistance: Modified independent (Device/Increase time) ?Stair Management: One rail Right, Alternating pattern ?Number of Stairs: 4 ?General stair comments: no LOB noted ? ?Wheelchair Mobility ?  ? ?Modified Rankin (Stroke Patients Only) ?  ? ?  ? ?Balance Overall balance assessment: Modified Independent ?  ?  ?  ?  ?  ?  ?  ?  ?  ?  ?  ?  ?  ?  ?  ?  ?  ?  ?   ? ? ? ?Pertinent Vitals/Pain Pain Assessment ?Pain Assessment: No/denies pain  ? ? ?Home Living Family/patient expects to be discharged to:: Private residence ?Living Arrangements: Spouse/significant other ?Available Help at Discharge: Family;Available PRN/intermittently;Available 24 hours/day ?Type of Home: House ?Home Access: Stairs to enter ?Entrance Stairs-Rails: Right;Left;Can reach both ?  Entrance Stairs-Number of Steps: 3 ?  ?Home Layout: One level ?Home Equipment: None ?   ?  ?Prior Function Prior Level of Function : Independent/Modified Independent ?  ?  ?  ?  ?  ?  ?  ?  ?  ? ? ?Hand Dominance  ?  Dominant Hand: Right ? ?  ?Extremity/Trunk Assessment  ? Upper Extremity Assessment ?Upper Extremity Assessment: Overall WFL for tasks assessed ?  ? ?Lower Extremity Assessment ?Lower Extremity Assessment: Overall WFL for tasks assessed ?  ? ?Cervical / Trunk Assessment ?Cervical / Trunk Assessment: Normal  ?Communication  ? Communication: HOH  ?Cognition Arousal/Alertness: Awake/alert ?Behavior During Therapy: [facility] for tasks assessed/performed ?Overall Cognitive Status: Within Functional Limits for tasks assessed ?  ?  ?  ?  ?  ?  ?  ?  ?  ?  ?  ?  ?  ?  ?  ?  ?General Comments: A&Ox3 self location situation ?  ?  ? ?  ?General Comments General comments (skin integrity, edema, etc.): SpO2 remained >90% on room air ? ?  ?Exercises Other Exercises ?Other Exercises: patient educated on role of PT in acute care setting, fall risk, and d/c recommendations  ? ?Assessment/Plan  ?  ?PT Assessment Patient does not need any further PT services  ?PT Problem List   ? ?   ?  ?PT Treatment Interventions     ? ?PT Goals (Current goals can be found in the Care Plan section)  ?Acute Rehab PT Goals ?Patient Stated Goal: to go home and get back into his routine ?PT Goal Formulation: With patient ?Time For Goal Achievement: 03/11/22 ?Potential to Achieve Goals: Good ? ?  ?Frequency   ?  ? ? ?Co-evaluation   ?  ?  ?  ?  ? ? ?  ?AM-PAC PT "6 Clicks" Mobility  ?Outcome Measure Help needed turning from your back to your side while in a flat bed without using bedrails?: None ?Help needed moving from lying on your back to sitting on the side of a flat bed without using bedrails?: None ?Help needed moving to and from a bed to a chair (including a wheelchair)?: None ?Help needed standing up from a chair using your arms (e.g., wheelchair or bedside chair)?: None ?Help needed to walk in hospital room?: None ?Help needed climbing 3-5 steps with a railing? : None ?6 Click Score: 24 ? ?  ?End of Session Equipment Utilized During Treatment: Gait  belt ?Activity Tolerance: Patient tolerated treatment well ?Patient left: with call bell/phone within reach;in chair ?Nurse Communication: Mobility status ?PT Visit Diagnosis: Unsteadiness on feet (R26.81) ?  ? ?Time: 2330-0762 ?PT Time Calculation (min) (ACUTE ONLY): 23 min ? ? ?Charges:   PT Evaluation ?$PT Eval Low Complexity: 1 Low ?PT Treatments ?$Therapeutic Activity: 8-22 mins ?  ?   ? ? ?Iva Boop, PT  ?02/25/22. 10:46 AM ? ? ?

## 2022-02-25 NOTE — Care Management Important Message (Signed)
Important Message ? ?Patient Details  ?Name: Vincent Koch ?MRN: [id number] ?Date of Birth: [birth year] ? ? ?Medicare Important Message Given:  Yes ? ?Patient is in an isolation room so I reviewed the Important Message from Medicare with him by phone [phone]) and he is in agreement with his discharge. I asked if he would like a copy and he said no.  I wished him a speedy recovery and thanked him for his time. ? ? ?Juliann Pulse A Kentaro Alewine ?02/25/2022, 12:42 PM ?

## 2022-02-25 NOTE — Discharge Summary (Signed)
?Physician Discharge Summary ?  ?Patient: Vincent Koch MRN: [id number] DOB: 1934/02/25  ?Admit date:     02/21/2022  ?Discharge date: 02/25/22  ?Discharge Physician: Lorella Nimrod  ? ?PCP: Charlynne Cousins, MD  ? ?Recommendations at discharge:  ?Please obtain CBC and BMP in 1 week ?Follow-up with primary care provider in 1 week ?Please make sure that patient complete the course of antibiotics as advised. ? ?Discharge Diagnoses: ?Principal Problem: ?  Acute colitis ?Active Problems: ?  Sepsis ([place]) ?  AKI (acute kidney injury) ([place]) ?  Hypothyroidism ?  Dyslipidemia ?  Essential hypertension ?  GERD (gastroesophageal reflux disease) ? ?Hospital Course: ?Taken from prior notes. ? ?Vincent Koch is a 88 y.o. Caucasian male with medical history significant for dyslipidemia, hypertension and hypothyroidism, who presented to the emergency room with acute onset of generalized abdominal pain over the last couple weeks with associated diarrhea without melena or bright red blood per rectum as well as nausea and significant diminished appetite. CT scan with contrast revealed the following: ?1. Diffuse colonic wall thickening likely representing colitis. ?C.diff positive . Started on abx and ivf. ? ?Patient was also found to have elevated creatinine around 1.7, no known baseline. ?Creatinine now started improving with IV fluid. ?After talking with wife it was found to have he has CKD stage IIIb, there is another chart which need to be merge in the system.  His baseline creatinine seems to be around 1.4-1.5, with recent worsening his nephrologist advised to hold lisinopril.  He follow-up with [facility].  His last creatinine in the system was 1.8. ? ?Per wife no recent antibiotic exposure and no prior history of C. difficile colitis. ? ?Marked leukocytosis which started improving. ? ?Patient was tolerating full liquid diet today and looking forward to eat solid, diet was advanced. ?Clinically seems improving.  Still had 2  episodes of diarrhea since morning, but stating that very small in quantity. ? ?Patient remained stable, still having loose bowel movements but stating that it is becoming more formed as compared to before.  No abdominal pain, no nausea or vomiting.  Able to tolerate regular diet.  He wants to go home.  Leukocytosis improving, renal function improved, better than his prior baseline, creatinine at 1.34 today.  Having mild hypokalemia which was repleted.  Patient was advised to drink rehydration fluid. ? ?He was given 7 more days of p.o. vancomycin and Flagyl and advised to follow-up with primary care provider for further recommendations and repeat labs. ? ?He will continue with rest of his home medications and follow-up with his providers. ? ?Assessment and Plan: ?* Acute colitis ?Patient was admitted with acute colitis secondary to C. Difficile. ?Initially received ceftriaxone and Flagyl and later switched to p.o. vancomycin and Flagyl.  Clinically seems improving. ?Leukocytosis, abdominal pain and diarrhea improving. ?Tolerating full liquid diet well, no nausea or vomiting and wants to eat solid. ?-Advance diet ?-Continue with p.o. vancomycin and Flagyl. ?-Continue with IV fluid for another day ?-Continue with supportive care ?-PT evaluation ? ?Sepsis ([place]) ?Patient met sepsis criteria with marked leukocytosis and tachycardia secondary to C. difficile colitis.  Blood cultures remain negative ?Please see above for colitis. ? ?AKI (acute kidney injury) ([place]) ?Creatinine at 1.7 on admission.  Now improved to 1.5 with IV fluid. ?Apparently patient has an history of CKD stage IIIb with baseline creatinine around 1.4-1.5 and follow-up with [facility] regularly.  Last creatinine check was 1.8 and his nephrologist advised to hold lisinopril due to worsening renal  function. ?-Continue with IV fluid for another day. ?-Monitor renal function ?-Avoid nephrotoxins ? ?Dyslipidemia ?- Home dose of fenofibrate is being held  due to colitis. ? ?Hypothyroidism ?- We will continue Synthroid. ? ?GERD (gastroesophageal reflux disease) ?- We will continue PPI therapy. ? ?Essential hypertension ?Blood pressure within goal. ?Home dose of Zestril is being held for concern of AKI. ?-Continue holding Zestril. ?-Continue with as needed hydralazine ? ? ?Consultants: None ?Procedures performed: None ?Disposition: Home ?Diet recommendation:  ?Discharge Diet Orders (From admission, onward)  ? ?  Start     Ordered  ? 02/25/22 0000  Diet - low sodium heart healthy       ? 02/25/22 1230  ? ?  ?  ? ?  ? ?Cardiac diet ?DISCHARGE MEDICATION: ?Allergies as of 02/25/2022   ?No Known Allergies ?  ? ?  ?Medication List  ?  ? ?TAKE these medications   ? ?acidophilus Caps capsule ?Take 2 capsules by mouth 3 (three) times daily. ?  ?fenofibrate 145 MG tablet ?Commonly known as: TRICOR ?Take 1 tablet (145 mg total) by mouth daily. Hold till your C. difficile infection completely resolves ?What changed: additional instructions ?  ?levothyroxine 25 MCG tablet ?Commonly known as: SYNTHROID ?Take 25 mcg by mouth daily before breakfast. ?  ?lisinopril 10 MG tablet ?Commonly known as: ZESTRIL ?Take 10 mg by mouth daily. ?  ?metroNIDAZOLE 500 MG tablet ?Commonly known as: Flagyl ?Take 1 tablet (500 mg total) by mouth 3 (three) times daily for 7 days. ?  ?ondansetron 4 MG tablet ?Commonly known as: ZOFRAN ?Take 1 tablet (4 mg total) by mouth every 6 (six) hours as needed for nausea. ?  ?pantoprazole 40 MG tablet ?Commonly known as: PROTONIX ?Take 40 mg by mouth daily. ?  ?vancomycin 125 MG capsule ?Commonly known as: VANCOCIN ?Take 1 capsule (125 mg total) by mouth 4 (four) times daily for 7 days. ?  ? ?  ? ? Follow-up Information   ? ? Vigg, Avanti, MD. Schedule an appointment as soon as possible for a visit in 1 week(s).   ?Specialty: Internal Medicine ?Contact information: ?[address] ?[place] [address] ?[phone] ? ? ?  ?  ? ?  ?  ? ?  ? ?Discharge Exam: ?Filed Weights   ? 02/21/22 2232  ?Weight: 73.5 kg  ? ?General.     In no acute distress. ?Pulmonary.  Lungs clear bilaterally, normal respiratory effort. ?CV.  Regular rate and rhythm, no JVD, rub or murmur. ?Abdomen.  Soft, nontender, nondistended, BS positive. ?CNS.  Alert and oriented x3.  No focal neurologic deficit. ?Extremities.  No edema, no cyanosis, pulses intact and symmetrical. ?Psychiatry.  Judgment and insight appears normal.  ? ?Condition at discharge: stable ? ?The results of significant diagnostics from this hospitalization (including imaging, microbiology, ancillary and laboratory) are listed below for reference.  ? ?Imaging Studies: ?CT ABDOMEN PELVIS W CONTRAST ? ?Result Date: 02/22/2022 ?CLINICAL DATA:  Acute nonlocalized abdominal pain since Thursday. Nausea and diarrhea. EXAM: CT ABDOMEN AND PELVIS WITH CONTRAST TECHNIQUE: Multidetector CT imaging of the abdomen and pelvis was performed using the standard protocol following bolus administration of intravenous contrast. RADIATION DOSE REDUCTION: This exam was performed according to the departmental dose-optimization program which includes automated exposure control, adjustment of the mA and/or kV according to patient size and/or use of iterative reconstruction technique. CONTRAST:  54m OMNIPAQUE IOHEXOL 300 MG/ML  SOLN COMPARISON:  None. FINDINGS: Lower chest: Emphysematous changes in the lung bases. Large  esophageal hiatal hernia. Hepatobiliary: Diffuse fatty infiltration of the liver. No focal lesions. Gallbladder and bile ducts are normal. Pancreas: Unremarkable. No pancreatic ductal dilatation or surrounding inflammatory changes. Spleen: Normal in size without focal abnormality. Adrenals/Urinary Tract: No adrenal gland nodules. Kidneys are symmetrical. No hydronephrosis or hydroureter. Several stones in the left kidney, largest measuring 7 mm diameter. No ureteral stones. Bladder is normal. Stomach/Bowel: Stomach, small bowel, and colon are not abnormally  distended. Diffuse wall thickening suggested throughout the colon likely representing colitis. Consider infectious or inflammatory etiologies. Possibly pseudomembranous colitis. Diverticulosis of the sigmo

## 2022-02-26 ENCOUNTER — Encounter: Payer: Self-pay | Admitting: Internal Medicine

## 2022-02-27 LAB — CULTURE, BLOOD (ROUTINE X 2)
Culture: NO GROWTH
Culture: NO GROWTH
Special Requests: ADEQUATE

## 2022-03-01 ENCOUNTER — Telehealth: Payer: Self-pay | Admitting: *Deleted

## 2022-03-01 NOTE — Telephone Encounter (Signed)
Transition Care Management Follow-up Telephone Call ?Date of discharge and from where: [facility] 02-25-2022 ?How have you been since you were released from the hospital? Feeling ok a little week ?Any questions or concerns? No ? ?Items Reviewed: ?Did the pt receive and understand the discharge instructions provided? Yes  ?Medications obtained and verified? Yes  ?Other? No  ?Any new allergies since your discharge? No  ?Dietary orders reviewed? Yes ?Do you have support at home? Yes  ? ?Home Care and Equipment/Supplies: ?Were home health services ordered?  ?If so, what is the name of the agency?   ?Has the agency set up a time to come to the patient's home?  ?Were any new equipment or medical supplies ordered?   ?What is the name of the medical supply agency?  ?Were you able to get the supplies/equipment?  ?Do you have any questions related to the use of the equipment or supplies?  ? ?Functional Questionnaire: (I = Independent and D = Dependent) ?ADLs: II ? ?Bathing/Dressing- I ? ?Meal Prep- I ? ?Eating- I ? ?Maintaining continence- I ? ?Transferring/Ambulation- I ? ?Managing Meds- I ? ?Follow up appointments reviewed: ? ?PCP Hospital f/u appt confirmed? Yes  Scheduled to see 4-17 on 17 @ 2:20  vIGG. ?[facility] f/u appt confirmed? No  Scheduled to see  on  @ . ?Are transportation arrangements needed?  NO ?If their condition worsens, is the pt aware to call PCP or go to the Emergency Dept.? Yes ?Was the patient provided with contact information for the PCP's office or ED? Yes ?Was to pt encouraged to call back with questions or concerns? Yes  ?

## 2022-03-05 ENCOUNTER — Ambulatory Visit: Payer: Medicare Other

## 2022-03-08 ENCOUNTER — Ambulatory Visit (INDEPENDENT_AMBULATORY_CARE_PROVIDER_SITE_OTHER): Payer: Medicare Other | Admitting: Internal Medicine

## 2022-03-08 ENCOUNTER — Ambulatory Visit
Admission: RE | Admit: 2022-03-08 | Discharge: 2022-03-08 | Disposition: A | Discharge status: Home / Self Care | Payer: Medicare Other | Attending: Internal Medicine | Admitting: Internal Medicine

## 2022-03-08 ENCOUNTER — Encounter: Payer: Self-pay | Admitting: Internal Medicine

## 2022-03-08 ENCOUNTER — Ambulatory Visit
Admission: RE | Admit: 2022-03-08 | Discharge: 2022-03-08 | Disposition: A | Discharge status: Home / Self Care | Payer: Medicare Other | Source: Ambulatory Visit | Attending: Internal Medicine | Admitting: Internal Medicine

## 2022-03-08 VITALS — BP 113/70 | HR 69 | Temp 97.8°F | Ht 68.11 in | Wt 161.4 lb

## 2022-03-08 DIAGNOSIS — M7989 Other specified soft tissue disorders: Secondary | ICD-10-CM | POA: Diagnosis not present

## 2022-03-08 DIAGNOSIS — R2241 Localized swelling, mass and lump, right lower limb: Secondary | ICD-10-CM

## 2022-03-08 DIAGNOSIS — E119 Type 2 diabetes mellitus without complications: Secondary | ICD-10-CM

## 2022-03-08 DIAGNOSIS — Z20822 Contact with and (suspected) exposure to covid-19: Secondary | ICD-10-CM | POA: Diagnosis not present

## 2022-03-08 NOTE — Progress Notes (Signed)
? ?BP 113/70   Pulse 69   Temp 97.8 ?F (36.6 ?C) (Oral)   Ht 5' 8.11" (1.73 m)   Wt 161 lb 6.4 oz (73.2 kg)   SpO2 98%   BMI 24.46 kg/m?   ? ?Subjective:  ? ? Patient ID: Vincent Koch, male    DOB: [birth year], 88 y.o.   MRN: [id number] ? ?Chief Complaint  ?Patient presents with  ?? Hospitalization Follow-up  ?  Colitis and C. Diff ?  ? ? ?HPI: ?Vincent Koch is a 88 y.o. male ? ?Pt is here for a hospital follow up. Was diagnosed with Cdiff colitis without use of antibiotic usage. ? ?Abdominal Pain ?Chronicity: improved no more diarrhea has been increasing fluid intake. The current episode started 1 to 4 weeks ago. Pertinent negatives include no arthralgias, constipation, diarrhea, dysuria, fever, frequency, headaches, hematuria, myalgias, nausea or vomiting.  ?Toe Pain  ?The incident occurred more than 1 week ago. The pain has been Fluctuating since onset. Associated symptoms include an inability to bear weight. Pertinent negatives include no loss of motion, loss of sensation, muscle weakness, numbness or tingling. He reports no foreign bodies present. The treatment provided mild relief.  ? ?Chief Complaint  ?Patient presents with  ?? Hospitalization Follow-up  ?  Colitis and C. Diff ?  ? ? ?Relevant past medical, surgical, family and social history reviewed and updated as indicated. Interim medical history since our last visit reviewed. ?Allergies and medications reviewed and updated. ? ?Review of Systems  ?Constitutional:  Negative for activity change, appetite change, chills, fatigue and fever.  ?HENT:  Negative for congestion, ear discharge, ear pain and facial swelling.   ?Eyes:  Negative for pain, discharge and itching.  ?Respiratory:  Negative for cough, chest tightness, shortness of breath and wheezing.   ?Cardiovascular:  Negative for chest pain, palpitations and leg swelling.  ?Gastrointestinal:  Positive for abdominal pain. Negative for abdominal distention, blood in stool, constipation,  diarrhea, nausea and vomiting.  ?Endocrine: Negative for cold intolerance, heat intolerance, polydipsia, polyphagia and polyuria.  ?Genitourinary:  Negative for difficulty urinating, dysuria, flank pain, frequency, hematuria and urgency.  ?Musculoskeletal:  Negative for arthralgias, gait problem, joint swelling and myalgias.  ?Skin:  Negative for color change, rash and wound.  ?Neurological:  Negative for dizziness, tingling, tremors, speech difficulty, weakness, light-headedness, numbness and headaches.  ?Hematological:  Does not bruise/bleed easily.  ?Psychiatric/Behavioral:  Negative for agitation, confusion, decreased concentration, sleep disturbance and suicidal ideas.   ? ?Per HPI unless specifically indicated above ? ?   ?Objective:  ?  ?BP 113/70   Pulse 69   Temp 97.8 ?F (36.6 ?C) (Oral)   Ht 5' 8.11" (1.73 m)   Wt 161 lb 6.4 oz (73.2 kg)   SpO2 98%   BMI 24.46 kg/m?   ?Wt Readings from Last 3 Encounters:  ?03/08/22 161 lb 6.4 oz (73.2 kg)  ?02/21/22 162 lb (73.5 kg)  ?12/23/21 168 lb 3.2 oz (76.3 kg)  ?  ?Physical Exam ?Vitals and nursing note reviewed.  ?Constitutional:   ?   General: He is not in acute distress. ?   Appearance: Normal appearance. He is not ill-appearing or diaphoretic.  ?HENT:  ?   Head: Normocephalic and atraumatic.  ?   Right Ear: Tympanic membrane and external ear normal. There is no impacted cerumen.  ?   Left Ear: External ear normal.  ?   Nose: No congestion or rhinorrhea.  ?   Mouth/Throat:  ?  Pharynx: No oropharyngeal exudate or posterior oropharyngeal erythema.  ?Eyes:  ?   Conjunctiva/sclera: Conjunctivae normal.  ?   Pupils: Pupils are equal, round, and reactive to light.  ?Cardiovascular:  ?   Rate and Rhythm: Normal rate and regular rhythm.  ?   Heart sounds: No murmur heard. ?  No friction rub. No gallop.  ?Pulmonary:  ?   Effort: No respiratory distress.  ?   Breath sounds: No stridor. No wheezing or rhonchi.  ?Chest:  ?   Chest wall: No tenderness.  ?Abdominal:  ?    General: Abdomen is flat. Bowel sounds are normal.  ?   Palpations: Abdomen is soft. There is no mass.  ?   Tenderness: There is no abdominal tenderness.  ?Musculoskeletal:  ?   Cervical back: Normal range of motion and neck supple. No rigidity or tenderness.  ?   Left lower leg: No edema.  ?Skin: ?   General: Skin is warm and dry.  ?Neurological:  ?   Mental Status: He is alert.  ? ? ?Results for orders placed or performed in visit on 03/08/22  ?Comprehensive metabolic panel  ?Result Value Ref Range  ? Glucose 141 (H) 70 - 99 mg/dL  ? BUN 13 8 - 27 mg/dL  ? Creatinine, Ser 1.40 (H) 0.76 - 1.27 mg/dL  ? eGFR 49 (L) >59 mL/min/1.73  ? BUN/Creatinine Ratio 9 (L) 10 - 24  ? Sodium 138 134 - 144 mmol/L  ? Potassium 4.0 3.5 - 5.2 mmol/L  ? Chloride 102 96 - 106 mmol/L  ? CO2 18 (L) 20 - 29 mmol/L  ? Calcium 9.3 8.6 - 10.2 mg/dL  ? Total Protein 6.3 6.0 - 8.5 g/dL  ? Albumin 3.7 3.6 - 4.6 g/dL  ? Globulin, Total 2.6 1.5 - 4.5 g/dL  ? Albumin/Globulin Ratio 1.4 1.2 - 2.2  ? Bilirubin Total 0.3 0.0 - 1.2 mg/dL  ? Alkaline Phosphatase 69 44 - 121 IU/L  ? AST 35 0 - 40 IU/L  ? ALT 22 0 - 44 IU/L  ?CBC with Differential/Platelet  ?Result Value Ref Range  ? WBC 6.9 3.4 - 10.8 x10E3/uL  ? RBC 4.21 4.14 - 5.80 x10E6/uL  ? Hemoglobin 12.7 (L) 13.0 - 17.7 g/dL  ? Hematocrit 39.1 37.5 - 51.0 %  ? MCV 93 79 - 97 fL  ? MCH 30.2 26.6 - 33.0 pg  ? MCHC 32.5 31.5 - 35.7 g/dL  ? RDW 13.4 11.6 - 15.4 %  ? Platelets 327 150 - 450 x10E3/uL  ? Neutrophils 63 Not Estab. %  ? Lymphs 21 Not Estab. %  ? Monocytes 9 Not Estab. %  ? Eos 5 Not Estab. %  ? Basos 1 Not Estab. %  ? Neutrophils Absolute 4.3 1.4 - 7.0 x10E3/uL  ? Lymphocytes Absolute 1.5 0.7 - 3.1 x10E3/uL  ? Monocytes Absolute 0.6 0.1 - 0.9 x10E3/uL  ? EOS (ABSOLUTE) 0.4 0.0 - 0.4 x10E3/uL  ? Basophils Absolute 0.1 0.0 - 0.2 x10E3/uL  ? Immature Granulocytes 1 Not Estab. %  ? Immature Grans (Abs) 0.1 0.0 - 0.1 x10E3/uL  ?Uric acid  ?Result Value Ref Range  ? Uric Acid WILL FOLLOW    ?CBC With Differential/Platelet  ?Result Value Ref Range  ? WBC 7.2 3.4 - 10.8 x10E3/uL  ? RBC 4.27 4.14 - 5.80 x10E6/uL  ? Hemoglobin 13.2 13.0 - 17.7 g/dL  ? Hematocrit 38.9 37.5 - 51.0 %  ? MCV 91 79 - 97 fL  ?  MCH 30.9 26.6 - 33.0 pg  ? MCHC 33.9 31.5 - 35.7 g/dL  ? RDW 15.0 11.6 - 15.4 %  ? Platelets 347 150 - 450 x10E3/uL  ? Neutrophils 63 Not Estab. %  ? Lymphs 22 Not Estab. %  ? MID 15 Not Estab. %  ? Neutrophils Absolute 4.5 1.4 - 7.0 x10E3/uL  ? Lymphocytes Absolute 1.6 0.7 - 3.1 x10E3/uL  ? MID (Absolute) 1.1 0.1 - 1.6 X10E3/uL  ?Bayer DCA Hb A1c Waived (STAT)  ?Result Value Ref Range  ? HB A1C (BAYER DCA - WAIVED) 6.5 (H) 4.8 - 5.6 %  ? ?   ? ? ?Current Outpatient Medications:  ??  acetaminophen (TYLENOL) 500 MG tablet, Take 500 mg by mouth every 6 (six) hours as needed., Disp: , Rfl:  ??  acidophilus (RISAQUAD) CAPS capsule, Take 2 capsules by mouth 3 (three) times daily., Disp: 90 capsule, Rfl: 0 ??  aspirin EC 81 MG tablet, Take 81 mg by mouth once. , Disp: , Rfl:  ??  calcium-vitamin D (OSCAL WITH D) 500-200 MG-UNIT tablet, Take 1 tablet by mouth., Disp: , Rfl:  ??  colchicine 0.6 MG tablet, Take 1 tablet (0.6 mg total) by mouth daily., Disp: 30 tablet, Rfl: 1 ??  fenofibrate (TRICOR) 145 MG tablet, Take 1 tablet (145 mg total) by mouth daily. Hold till your C. difficile infection completely resolves, Disp: , Rfl:  ??  fexofenadine (ALLEGRA) 180 MG tablet, Take 180 mg by mouth as needed for allergies. , Disp: , Rfl:  ??  lactobacillus acidophilus & bulgar (LACTINEX) chewable tablet, Chew 1 tablet by mouth 3 (three) times daily with meals., Disp: 30 tablet, Rfl: 1 ??  levothyroxine (SYNTHROID) 25 MCG tablet, Take 1 tablet (25 mcg total) by mouth daily., Disp: 90 tablet, Rfl: 2 ??  methylPREDNISolone (MEDROL DOSEPAK) 4 MG TBPK tablet, Use as directed, Disp: 1 each, Rfl: 0 ??  Multiple Vitamin (MULTI-VITAMINS) TABS, Take by mouth daily. , Disp: , Rfl:  ??  ondansetron (ZOFRAN) 4 MG tablet, Take 1  tablet (4 mg total) by mouth every 6 (six) hours as needed for nausea., Disp: 20 tablet, Rfl: 0 ??  pantoprazole (PROTONIX) 40 MG tablet, Take 1 tablet by mouth once daily, Disp: 90 tablet, Rfl: 2  ? ? ?Assessment & Plan

## 2022-03-09 LAB — COMPREHENSIVE METABOLIC PANEL
ALT: 22 IU/L (ref 0–44)
AST: 35 IU/L (ref 0–40)
Albumin/Globulin Ratio: 1.4 (ref 1.2–2.2)
Albumin: 3.7 g/dL (ref 3.6–4.6)
Alkaline Phosphatase: 69 IU/L (ref 44–121)
BUN/Creatinine Ratio: 9 — ABNORMAL LOW (ref 10–24)
BUN: 13 mg/dL (ref 8–27)
Bilirubin Total: 0.3 mg/dL (ref 0.0–1.2)
CO2: 18 mmol/L — ABNORMAL LOW (ref 20–29)
Calcium: 9.3 mg/dL (ref 8.6–10.2)
Chloride: 102 mmol/L (ref 96–106)
Creatinine, Ser: 1.4 mg/dL — ABNORMAL HIGH (ref 0.76–1.27)
Globulin, Total: 2.6 g/dL (ref 1.5–4.5)
Glucose: 141 mg/dL — ABNORMAL HIGH (ref 70–99)
Potassium: 4 mmol/L (ref 3.5–5.2)
Sodium: 138 mmol/L (ref 134–144)
Total Protein: 6.3 g/dL (ref 6.0–8.5)
eGFR: 49 mL/min/{1.73_m2} — ABNORMAL LOW (ref >59)

## 2022-03-09 LAB — CBC WITH DIFFERENTIAL/PLATELET
Basophils Absolute: 0.1 10*3/uL (ref 0.0–0.2)
Basos: 1 %
EOS (ABSOLUTE): 0.4 10*3/uL (ref 0.0–0.4)
Eos: 5 %
Hematocrit: 38.9 % (ref 37.5–51.0)
Hematocrit: 39.1 % (ref 37.5–51.0)
Hemoglobin: 13.2 g/dL (ref 13.0–17.7)
Hemoglobin: 12.7 g/dL — ABNORMAL LOW (ref 13.0–17.7)
Immature Grans (Abs): 0.1 10*3/uL (ref 0.0–0.1)
Immature Granulocytes: 1 %
Lymphocytes Absolute: 1.6 10*3/uL (ref 0.7–3.1)
Lymphocytes Absolute: 1.5 10*3/uL (ref 0.7–3.1)
Lymphs: 22 %
Lymphs: 21 %
MCH: 30.9 pg (ref 26.6–33.0)
MCH: 30.2 pg (ref 26.6–33.0)
MCHC: 33.9 g/dL (ref 31.5–35.7)
MCHC: 32.5 g/dL (ref 31.5–35.7)
MCV: 91 fL (ref 79–97)
MCV: 93 fL (ref 79–97)
MID (Absolute): 1.1 10*3/uL (ref 0.1–1.6)
MID: 15 %
Monocytes Absolute: 0.6 10*3/uL (ref 0.1–0.9)
Monocytes: 9 %
Neutrophils Absolute: 4.5 10*3/uL (ref 1.4–7.0)
Neutrophils Absolute: 4.3 10*3/uL (ref 1.4–7.0)
Neutrophils: 63 %
Neutrophils: 63 %
Platelets: 347 10*3/uL (ref 150–450)
Platelets: 327 10*3/uL (ref 150–450)
RBC: 4.27 x10E6/uL (ref 4.14–5.80)
RBC: 4.21 x10E6/uL (ref 4.14–5.80)
RDW: 15 % (ref 11.6–15.4)
RDW: 13.4 % (ref 11.6–15.4)
WBC: 7.2 10*3/uL (ref 3.4–10.8)
WBC: 6.9 10*3/uL (ref 3.4–10.8)

## 2022-03-09 LAB — URIC ACID: Uric Acid: 6.4 mg/dL (ref 3.8–8.4)

## 2022-03-09 LAB — BAYER DCA HB A1C WAIVED: HB A1C (BAYER DCA - WAIVED): 6.5 % — ABNORMAL HIGH (ref 4.8–5.6)

## 2022-03-09 MED ORDER — METHYLPREDNISOLONE 4 MG PO TBPK: ORAL_TABLET | ORAL | 0 refills | Status: DC

## 2022-03-09 MED ORDER — COLCHICINE 0.6 MG PO TABS: 0.6000 mg | ORAL_TABLET | Freq: Every day | ORAL | 1 refills | Status: DC

## 2022-03-10 ENCOUNTER — Ambulatory Visit (INDEPENDENT_AMBULATORY_CARE_PROVIDER_SITE_OTHER): Payer: Medicare Other | Admitting: *Deleted

## 2022-03-10 DIAGNOSIS — Z0000 Encounter for general adult medical examination without abnormal findings: Secondary | ICD-10-CM | POA: Diagnosis not present

## 2022-03-10 NOTE — Telephone Encounter (Signed)
Copied from [place] [id number]. Topic: Appointment Scheduling - Scheduling Inquiry for Clinic ?>> Mar 10, 2022 12:10 PM Pawlus, Brayton Layman A wrote: ?Reason for CRM: Pt called in stating he answered his telephone appt call at 12, but nobody responded on the other line, pt stated there seems to be an issue connecting, Please advise if pt can be called back at [phone] for [facility] appt. ?

## 2022-03-10 NOTE — Progress Notes (Signed)
? ?Subjective:  ? Vincent Koch is a 88 y.o. male who presents for Medicare Annual/Subsequent preventive examination. ? ?I connected with  [name] on 03/10/22 by a telephone enabled telemedicine application and verified that I am speaking with the correct person using two identifiers. ?  ?I discussed the limitations of evaluation and management by telemedicine. The patient expressed understanding and agreed to proceed. ? ?Patient location: home ? ?Provider location: Tele-Health not in office ? ? ? ?Review of Systems    ? ?Cardiac Risk Factors include: advanced age (>3mn, >>39women);hypertension;male gender;sedentary lifestyle ? ?   ?Objective:  ?  ?Today's Vitals  ? ?There is no height or weight on file to calculate BMI. ? ? ?  03/10/2022  ? 12:31 PM 02/22/2022  ?  1:49 PM 02/21/2022  ? 10:34 PM 05/26/2021  ?  3:20 PM 03/02/2021  ?  9:03 AM 06/27/2019  ? 10:55 AM 06/13/2019  ?  2:16 PM  ?Advanced Directives  ?Does Patient Have a Medical Advance Directive? Yes Yes Yes Yes Yes Yes Yes  ?Type of Advance Directive Living will Healthcare Power of Attorney [place] Living will Living will;Healthcare Power of Attorney  ?Does patient want to make changes to medical advance directive?  No - Patient declined    No - Patient declined   ?Copy of [place]? No - copy requested No - copy requested  No - copy requested No - copy requested Yes - validated most recent copy scanned in chart (See row information) No - copy requested  ?Would patient like information on creating a medical advance directive? No - Patient declined  No - Patient declined      ? ? ?Current Medications (verified) ?Outpatient Encounter Medications as of 03/10/2022  ?Medication Sig  ? acetaminophen (TYLENOL) 500 MG tablet Take 500 mg by mouth every 6 (six) hours as needed.  ? acidophilus (RISAQUAD) CAPS capsule Take 2 capsules by mouth 3  (three) times daily.  ? aspirin EC 81 MG tablet Take 81 mg by mouth once.   ? calcium-vitamin D (OSCAL WITH D) 500-200 MG-UNIT tablet Take 1 tablet by mouth.  ? colchicine 0.6 MG tablet Take 1 tablet (0.6 mg total) by mouth daily.  ? fenofibrate (TRICOR) 145 MG tablet Take 1 tablet (145 mg total) by mouth daily. Hold till your C. difficile infection completely resolves  ? fexofenadine (ALLEGRA) 180 MG tablet Take 180 mg by mouth as needed for allergies.   ? lactobacillus acidophilus & bulgar (LACTINEX) chewable tablet Chew 1 tablet by mouth 3 (three) times daily with meals.  ? levothyroxine (SYNTHROID) 25 MCG tablet Take 1 tablet (25 mcg total) by mouth daily.  ? methylPREDNISolone (MEDROL DOSEPAK) 4 MG TBPK tablet Use as directed  ? Multiple Vitamin (MULTI-VITAMINS) TABS Take by mouth daily.   ? ondansetron (ZOFRAN) 4 MG tablet Take 1 tablet (4 mg total) by mouth every 6 (six) hours as needed for nausea.  ? pantoprazole (PROTONIX) 40 MG tablet Take 1 tablet by mouth once daily  ? ?No facility-administered encounter medications on file as of 03/10/2022.  ? ? ?Allergies (verified) ?Patient has no known allergies.  ? ?History: ?Past Medical History:  ?Diagnosis Date  ? Bradycardia   ? Chronic kidney disease   ? GERD (gastroesophageal reflux disease)   ? Hyperlipemia   ? Hypertension   ? Neuropathy   ? toes  ? Osteoporosis   ?  Peripheral neuropathy   ? Thyroid disease   ? ?Past Surgical History:  ?Procedure Laterality Date  ? CATARACT EXTRACTION W/PHACO Right 06/27/2019  ? Procedure: CATARACT EXTRACTION PHACO AND INTRAOCULAR LENS PLACEMENT ([place])  RIGHT;  Surgeon: Leandrew Koyanagi, MD;  Location: [facility];  Service: Ophthalmology;  Laterality: Right;  VISION BLUE  ? COLONOSCOPY WITH PROPOFOL N/A 12/11/2018  ? Procedure: COLONOSCOPY WITH PROPOFOL;  Surgeon: Virgel Manifold, MD;  Location: [facility];  Service: Endoscopy;  Laterality: N/A;  ? ESOPHAGEAL DILATION N/A 06/14/2016  ? Procedure:  ESOPHAGOGASTRODUODENOSCOPY (EGD) WITH PROPOFOL with dilation;  Surgeon: Lucilla Lame, MD;  Location: [place];  Service: Endoscopy;  Laterality: N/A;  ? ESOPHAGOGASTRODUODENOSCOPY (EGD) WITH PROPOFOL N/A 07/15/2016  ? Procedure: ESOPHAGOGASTRODUODENOSCOPY (EGD) WITH DILATION;  Surgeon: Lucilla Lame, MD;  Location: [place];  Service: Endoscopy;  Laterality: N/A;  ? ESOPHAGOGASTRODUODENOSCOPY (EGD) WITH PROPOFOL N/A 12/11/2018  ? Procedure: ESOPHAGOGASTRODUODENOSCOPY (EGD) WITH PROPOFOL;  Surgeon: Virgel Manifold, MD;  Location: [facility];  Service: Endoscopy;  Laterality: N/A;  ? HERNIA REPAIR    ? PARATHYROIDECTOMY    ? ?Family History  ?Problem Relation Age of Onset  ? Stroke Mother   ? Throat cancer Father   ? ?Social History  ? ?Socioeconomic History  ? Marital status: Married  ?  Spouse name: Not on file  ? Number of children: Not on file  ? Years of education: Not on file  ? Highest education level: Master's degree (e.g., MA, MS, MEng, MEd, MSW, MBA)  ?Occupational History  ? Occupation: retired  ?Tobacco Use  ? Smoking status: Never  ? Smokeless tobacco: Never  ?Vaping Use  ? Vaping Use: Never used  ?Substance and Sexual Activity  ? Alcohol use: Never  ? Drug use: Never  ? Sexual activity: Yes  ?Other Topics Concern  ? Not on file  ?Social History Narrative  ? ** Merged History Encounter **  ?    ? ?Social Determinants of Health  ? ?Financial Resource Strain: Low Risk   ? Difficulty of Paying Living Expenses: Not hard at all  ?Food Insecurity: No Food Insecurity  ? Worried About Charity fundraiser in the Last Year: Never true  ? Ran Out of Food in the Last Year: Never true  ?Transportation Needs: No Transportation Needs  ? Lack of Transportation (Medical): No  ? Lack of Transportation (Non-Medical): No  ?Physical Activity: Inactive  ? Days of Exercise per Week: 0 days  ? Minutes of Exercise per Session: 0 min  ?Stress: No Stress Concern Present  ? Feeling of Stress : Not at all   ?Social Connections: Moderately Integrated  ? Frequency of Communication with Friends and Family: More than three times a week  ? Frequency of Social Gatherings with Friends and Family: Three times a week  ? Attends Religious Services: More than 4 times per year  ? Active Member of Clubs or Organizations: No  ? Attends Archivist Meetings: Never  ? Marital Status: Married  ? ? ?Tobacco Counseling ?Counseling given: Not Answered ? ? ?Clinical Intake: ? ?Pre-visit preparation completed: Yes ? ?Pain : No/denies pain ? ?  ? ?Nutritional Risks: None ?Diabetes: No ? ?How often do you need to have someone help you when you read instructions, pamphlets, or other written materials from your doctor or pharmacy?: 1 - Never ? ?Diabetic?  no ? ?Interpreter Needed?: No ? ?Information entered by :: Leroy Kennedy LPN ? ? ?Activities of Daily Living ? ?  03/10/2022  ? 12:41 PM 02/22/2022  ?  1:49 PM  ?In your present state of health, do you have any difficulty performing the following activities:  ?Hearing? 0 0  ?Vision? 0 0  ?Difficulty concentrating or making decisions?  0  ?Walking or climbing stairs? 1 0  ?Dressing or bathing? 0 0  ?Doing errands, shopping? 0 0  ?Preparing Food and eating ? N   ?Using the Toilet? N   ?In the past six months, have you accidently leaked urine? N   ?Do you have problems with loss of bowel control? N   ?Managing your Medications? N   ?Housekeeping or managing your Housekeeping? N   ? ? ?Patient Care Team: ?Charlynne Cousins, MD as PCP - General (Internal Medicine) ?Lucilla Lame, MD as Consulting Physician (Gastroenterology) ?Charlynne Cousins, MD (Internal Medicine) ? ?Indicate any recent Medical Services you may have received from other than [facility] providers in the past year (date may be approximate). ? ?   ?Assessment:  ? This is a routine wellness examination for Vincent Koch. ? ?Hearing/Vision screen ?Hearing Screening - Comments:: No hearing aids ? ?Vision Screening - Comments:: Up to  date ?[place] ? ?Dietary issues and exercise activities discussed: ?Current Exercise Habits: The patient does not participate in regular exercise at present, Exercise limited by: Other - see comments (still recovering from hospital admi

## 2022-03-10 NOTE — Patient Instructions (Signed)
Mr. Din , ?Thank you for taking time to come for your Medicare Wellness Visit. I appreciate your ongoing commitment to your health goals. Please review the following plan we discussed and let me know if I can assist you in the future.  ? ?Screening recommendations/referrals: ?Colonoscopy: no longer required ?Recommended yearly ophthalmology/optometry visit for glaucoma screening and checkup ?Recommended yearly dental visit for hygiene and checkup ? ?Vaccinations: ?Influenza vaccine: up to date ?Pneumococcal vaccine: up to date ?Tdap vaccine: up to date ?Shingles vaccine: Education provided   ? ?Advanced directives: not on file has will ? ?Conditions/risks identified:  ? ?Next appointment: 03-30-2022 @ 2:00  [place]   06-22-2022 @ 2:40  [place] ? ?Preventive Care 75 Years and Older, Male ?Preventive care refers to lifestyle choices and visits with your health care provider that can promote health and wellness. ?What does preventive care include? ?A yearly physical exam. This is also called an annual well check. ?Dental exams once or twice a year. ?Routine eye exams. Ask your health care provider how often you should have your eyes checked. ?Personal lifestyle choices, including: ?Daily care of your teeth and gums. ?Regular physical activity. ?Eating a healthy diet. ?Avoiding tobacco and drug use. ?Limiting alcohol use. ?Practicing safe sex. ?Taking low doses of aspirin every day. ?Taking vitamin and mineral supplements as recommended by your health care provider. ?What happens during an annual well check? ?The services and screenings done by your health care provider during your annual well check will depend on your age, overall health, lifestyle risk factors, and family history of disease. ?Counseling  ?Your health care provider may ask you questions about your: ?Alcohol use. ?Tobacco use. ?Drug use. ?Emotional well-being. ?Home and relationship well-being. ?Sexual activity. ?Eating habits. ?History of falls. ?Memory  and ability to understand (cognition). ?Work and work Statistician. ?Screening  ?You may have the following tests or measurements: ?Height, weight, and BMI. ?Blood pressure. ?Lipid and cholesterol levels. These may be checked every 5 years, or more frequently if you are over 20 years old. ?Skin check. ?Lung cancer screening. You may have this screening every year starting at age 58 if you have a 30-pack-year history of smoking and currently smoke or have quit within the past 15 years. ?Fecal occult blood test (FOBT) of the stool. You may have this test every year starting at age 37. ?Flexible sigmoidoscopy or colonoscopy. You may have a sigmoidoscopy every 5 years or a colonoscopy every 10 years starting at age 3. ?Prostate cancer screening. Recommendations will vary depending on your family history and other risks. ?Hepatitis C blood test. ?Hepatitis B blood test. ?Sexually transmitted disease (STD) testing. ?Diabetes screening. This is done by checking your blood sugar (glucose) after you have not eaten for a while (fasting). You may have this done every 1-3 years. ?Abdominal aortic aneurysm (AAA) screening. You may need this if you are a current or former smoker. ?Osteoporosis. You may be screened starting at age 70 if you are at high risk. ?Talk with your health care provider about your test results, treatment options, and if necessary, the need for more tests. ?Vaccines  ?Your health care provider may recommend certain vaccines, such as: ?Influenza vaccine. This is recommended every year. ?Tetanus, diphtheria, and acellular pertussis (Tdap, Td) vaccine. You may need a Td booster every 10 years. ?Zoster vaccine. You may need this after age 39. ?Pneumococcal 13-valent conjugate (PCV13) vaccine. One dose is recommended after age 68. ?Pneumococcal polysaccharide (PPSV23) vaccine. One dose is recommended after age 31. ?  Talk to your health care provider about which screenings and vaccines you need and how often you  need them. ?This information is not intended to replace advice given to you by your health care provider. Make sure you discuss any questions you have with your health care provider. ?Document Released: 12/05/2015 Document Revised: 07/28/2016 Document Reviewed: 09/09/2015 ?Elsevier Interactive Patient Education ? 2017 [place]. ? ?Fall Prevention in the Home ?Falls can cause injuries. They can happen to people of all ages. There are many things you can do to make your home safe and to help prevent falls. ?What can I do on the outside of my home? ?Regularly fix the edges of walkways and driveways and fix any cracks. ?Remove anything that might make you trip as you walk through a door, such as a raised step or threshold. ?Trim any bushes or trees on the path to your home. ?Use bright outdoor lighting. ?Clear any walking paths of anything that might make someone trip, such as rocks or tools. ?Regularly check to see if handrails are loose or broken. Make sure that both sides of any steps have handrails. ?Any raised decks and porches should have guardrails on the edges. ?Have any leaves, snow, or ice cleared regularly. ?Use sand or salt on walking paths during winter. ?Clean up any spills in your garage right away. This includes oil or grease spills. ?What can I do in the bathroom? ?Use night lights. ?Install grab bars by the toilet and in the tub and shower. Do not use towel bars as grab bars. ?Use non-skid mats or decals in the tub or shower. ?If you need to sit down in the shower, use a plastic, non-slip stool. ?Keep the floor dry. Clean up any water that spills on the floor as soon as it happens. ?Remove soap buildup in the tub or shower regularly. ?Attach bath mats securely with double-sided non-slip rug tape. ?Do not have throw rugs and other things on the floor that can make you trip. ?What can I do in the bedroom? ?Use night lights. ?Make sure that you have a light by your bed that is easy to reach. ?Do not  use any sheets or blankets that are too big for your bed. They should not hang down onto the floor. ?Have a firm chair that has side arms. You can use this for support while you get dressed. ?Do not have throw rugs and other things on the floor that can make you trip. ?What can I do in the kitchen? ?Clean up any spills right away. ?Avoid walking on wet floors. ?Keep items that you use a lot in easy-to-reach places. ?If you need to reach something above you, use a strong step stool that has a grab bar. ?Keep electrical cords out of the way. ?Do not use floor polish or wax that makes floors slippery. If you must use wax, use non-skid floor wax. ?Do not have throw rugs and other things on the floor that can make you trip. ?What can I do with my stairs? ?Do not leave any items on the stairs. ?Make sure that there are handrails on both sides of the stairs and use them. Fix handrails that are broken or loose. Make sure that handrails are as long as the stairways. ?Check any carpeting to make sure that it is firmly attached to the stairs. Fix any carpet that is loose or worn. ?Avoid having throw rugs at the top or bottom of the stairs. If you do have throw  rugs, attach them to the floor with carpet tape. ?Make sure that you have a light switch at the top of the stairs and the bottom of the stairs. If you do not have them, ask someone to add them for you. ?What else can I do to help prevent falls? ?Wear shoes that: ?Do not have high heels. ?Have rubber bottoms. ?Are comfortable and fit you well. ?Are closed at the toe. Do not wear sandals. ?If you use a stepladder: ?Make sure that it is fully opened. Do not climb a closed stepladder. ?Make sure that both sides of the stepladder are locked into place. ?Ask someone to hold it for you, if possible. ?Clearly mark and make sure that you can see: ?Any grab bars or handrails. ?First and last steps. ?Where the edge of each step is. ?Use tools that help you move around (mobility  aids) if they are needed. These include: ?Canes. ?Walkers. ?Scooters. ?Crutches. ?Turn on the lights when you go into a dark area. Replace any light bulbs as soon as they burn out. ?Set up your furniture so

## 2022-03-11 ENCOUNTER — Ambulatory Visit: Payer: Self-pay | Admitting: *Deleted

## 2022-03-11 ENCOUNTER — Telehealth (INDEPENDENT_AMBULATORY_CARE_PROVIDER_SITE_OTHER): Payer: Medicare Other | Admitting: Internal Medicine

## 2022-03-11 ENCOUNTER — Encounter: Payer: Self-pay | Admitting: Internal Medicine

## 2022-03-11 DIAGNOSIS — A09 Infectious gastroenteritis and colitis, unspecified: Secondary | ICD-10-CM

## 2022-03-11 NOTE — Telephone Encounter (Signed)
I returned his call.  C-diff/ diarrhea - Was in the hospital and has been seen by PCP for hospital f/u.  Had some questions.  He is going to the bathroom 3-4 times/day having loose stools.   Can he take Imodium?  He is drinking Pedialyte.  He cancelled his kidney appt for today because going to the bathroom too frequently.   ?Reason for Disposition ? [1] SEVERE diarrhea (e.g., 7 or more times / day more than normal) AND [2] age > 60 years ? ?Answer Assessment - Initial Assessment Questions ?1. DIARRHEA SEVERITY: "How bad is the diarrhea?" "How many more stools have you had in the past 24 hours than normal?"  ?  - NO DIARRHEA (SCALE 0) ?  - MILD (SCALE 1-3): Few loose or mushy BMs; increase of 1-3 stools over normal daily number of stools; mild increase in ostomy output. ?  -  MODERATE (SCALE 4-7): Increase of 4-6 stools daily over normal; moderate increase in ostomy output. ?* SEVERE (SCALE 8-10; OR 'WORST POSSIBLE'): Increase of 7 or more stools daily over normal; moderate increase in ostomy output; incontinence. ?    He just got out of hospital 2 weeks ago.   Wife [name] answered.    He had acute colitis and C Diff.   He was getting better but now he is having diarrhea again.   He is going to the bathroom a lot.   I started him back on soft food he was on solid food. ?He went through this before he went into the hospital.   He was dehydrated.   He was given antibiotics in the hospital.   ?Can I give him Imodium?    ?2. ONSET: "When did the diarrhea begin?"  ?    2-3 days ago.   Last night up a lot to the bathroom. ?3. BM CONSISTENCY: "How loose or watery is the diarrhea?"  ?    Loose and watery   ?I don't think it's C Diff.   It doesn't smell like it did in the hospital.    It does look different.   Finished the antibiotics.      ?He had gout so Dr. Neomia Dear gave him some medication when he was in and saw her.   He is doing fine with his gout.   ?4. VOMITING: "Are you also vomiting?" If Yes, ask: "How many times in  the past 24 hours?"  ?    No ?5. ABDOMINAL PAIN: "Are you having any abdominal pain?" If Yes, ask: "What does it feel like?" (e.g., crampy, dull, intermittent, constant)  ?    Occasional cramping with diarrhea but nothing like he was. ?6. ABDOMINAL PAIN SEVERITY: If present, ask: "How bad is the pain?"  (e.g., Scale 1-10; mild, moderate, or severe) ?  - MILD (1-3): doesn't interfere with normal activities, abdomen soft and not tender to touch  ?  - MODERATE (4-7): interferes with normal activities or awakens from sleep, abdomen tender to touch  ?  - SEVERE (8-10): excruciating pain, doubled over, unable to do any normal activities   ?    *No Answer* ?7. ORAL INTAKE: If vomiting, "Have you been able to drink liquids?" "How much liquids have you had in the past 24 hours?" ?    Taking Pedialyte  ?8. HYDRATION: "Any signs of dehydration?" (e.g., dry mouth [not just dry lips], too weak to stand, dizziness, new weight loss) "When did you last urinate?" ?    *No Answer* ?9.  EXPOSURE: "Have you traveled to a foreign country recently?" "Have you been exposed to anyone with diarrhea?" "Could you have eaten any food that was spoiled?" ?    *No Answer* ?10. ANTIBIOTIC USE: "Are you taking antibiotics now or have you taken antibiotics in the past 2 months?" ?      Not now but had C-Diff ?11. OTHER SYMPTOMS: "Do you have any other symptoms?" (e.g., fever, blood in stool) ?      *No Answer* ?12. PREGNANCY: "Is there any chance you are pregnant?" "When was your last menstrual period?" ?      *No Answer* ? ?Protocols used: Diarrhea-A-AH ? ?

## 2022-03-11 NOTE — Telephone Encounter (Signed)
?  Chief Complaint: In hospital recently with C Diff.   Having diarrhea again very frequently ?Symptoms: loose diarrhea.   To the bathroom multiple times last night ?Frequency: Started again 3 days ago.   Frequent bathroom trips ?Pertinent Negatives: Patient denies it smelling like C diff like it did in the hospital per wife ?Disposition: '[]'$ ED /'[]'$ Urgent Care (no appt availability in office) / '[x]'$ Appointment(In office/virtual)/ '[]'$  [facility]/ '[]'$ Home Care/ '[]'$ Refused Recommended Disposition /'[]'$ [facility]/ '[]'$  Follow-up with PCP ?Additional Notes: Phone call visit made for this morning with Dr. Neomia Dear at 10:40.   They don't use the computer for video calls.     ?

## 2022-03-11 NOTE — Progress Notes (Signed)
? ?There were no vitals taken for this visit.  ? ?Subjective:  ? ? Patient ID: Vincent Koch, male    DOB: February 02, 1934, 88 y.o.   MRN: [id number] ? ?Chief Complaint  ?Patient presents with  ? Diarrhea  ?  Patient still going 3 to 4 times a day, foul smell has gone.   ? ? ?HPI: ?Vincent Koch is a 88 y.o. male ? ? ?This visit was completed via telephone due to the restrictions of the COVID-19 pandemic. All issues as above were discussed and addressed but no physical exam was performed. If it was felt that the patient should be evaluated in the office, they were directed there. The patient verbally consented to this visit. Patient was unable to complete an audio/visual visit due to Technical difficulties. Due to the catastrophic nature of the COVID-19 pandemic, this visit was done through audio contact only. ?Location of the patient: home ?Location of the provider: work ?Those involved with this call:  ?Provider: Charlynne Cousins, MD ?CMA: Frazier Butt, CMA ?Front Desk/Registration: [employer]  ?Time spent on call: 10 minutes on the phone discussing health concerns. 10 minutes total spent in review of patient's record and preparation of their chart. ? ? ? ?Diarrhea  ?This is a new (no fever or chlls, some abdominal distention. no cramping. no blood in stools right now. diarrhea started last night is about every 2 hrs) problem. The current episode started yesterday. Pertinent negatives include no abdominal pain, arthralgias, bloating, chills, coughing, fever, headaches, increased  flatus, myalgias, sweats, URI or vomiting.  ? ?Chief Complaint  ?Patient presents with  ? Diarrhea  ?  Patient still going 3 to 4 times a day, foul smell has gone.   ? ? ?Relevant past medical, surgical, family and social history reviewed and updated as indicated. Interim medical history since our last visit reviewed. ?Allergies and medications reviewed and updated. ? ?Review of Systems  ?Constitutional:  Negative for chills and fever.   ?Respiratory:  Negative for cough.   ?Gastrointestinal:  Positive for diarrhea. Negative for abdominal pain, bloating, flatus and vomiting.  ?Musculoskeletal:  Negative for arthralgias and myalgias.  ?Neurological:  Negative for headaches.  ? ?Per HPI unless specifically indicated above ? ?   ?Objective:  ?  ?There were no vitals taken for this visit.  ?Wt Readings from Last 3 Encounters:  ?03/08/22 161 lb 6.4 oz (73.2 kg)  ?02/21/22 162 lb (73.5 kg)  ?12/23/21 168 lb 3.2 oz (76.3 kg)  ?  ?Physical Exam ? ?Results for orders placed or performed in visit on 03/08/22  ?Comprehensive metabolic panel  ?Result Value Ref Range  ? Glucose 141 (H) 70 - 99 mg/dL  ? BUN 13 8 - 27 mg/dL  ? Creatinine, Ser 1.40 (H) 0.76 - 1.27 mg/dL  ? eGFR 49 (L) >59 mL/min/1.73  ? BUN/Creatinine Ratio 9 (L) 10 - 24  ? Sodium 138 134 - 144 mmol/L  ? Potassium 4.0 3.5 - 5.2 mmol/L  ? Chloride 102 96 - 106 mmol/L  ? CO2 18 (L) 20 - 29 mmol/L  ? Calcium 9.3 8.6 - 10.2 mg/dL  ? Total Protein 6.3 6.0 - 8.5 g/dL  ? Albumin 3.7 3.6 - 4.6 g/dL  ? Globulin, Total 2.6 1.5 - 4.5 g/dL  ? Albumin/Globulin Ratio 1.4 1.2 - 2.2  ? Bilirubin Total 0.3 0.0 - 1.2 mg/dL  ? Alkaline Phosphatase 69 44 - 121 IU/L  ? AST 35 0 - 40 IU/L  ? ALT 22  0 - 44 IU/L  ?CBC with Differential/Platelet  ?Result Value Ref Range  ? WBC 6.9 3.4 - 10.8 x10E3/uL  ? RBC 4.21 4.14 - 5.80 x10E6/uL  ? Hemoglobin 12.7 (L) 13.0 - 17.7 g/dL  ? Hematocrit 39.1 37.5 - 51.0 %  ? MCV 93 79 - 97 fL  ? MCH 30.2 26.6 - 33.0 pg  ? MCHC 32.5 31.5 - 35.7 g/dL  ? RDW 13.4 11.6 - 15.4 %  ? Platelets 327 150 - 450 x10E3/uL  ? Neutrophils 63 Not Estab. %  ? Lymphs 21 Not Estab. %  ? Monocytes 9 Not Estab. %  ? Eos 5 Not Estab. %  ? Basos 1 Not Estab. %  ? Neutrophils Absolute 4.3 1.4 - 7.0 x10E3/uL  ? Lymphocytes Absolute 1.5 0.7 - 3.1 x10E3/uL  ? Monocytes Absolute 0.6 0.1 - 0.9 x10E3/uL  ? EOS (ABSOLUTE) 0.4 0.0 - 0.4 x10E3/uL  ? Basophils Absolute 0.1 0.0 - 0.2 x10E3/uL  ? Immature Granulocytes 1 Not  Estab. %  ? Immature Grans (Abs) 0.1 0.0 - 0.1 x10E3/uL  ?Uric acid  ?Result Value Ref Range  ? Uric Acid 6.4 3.8 - 8.4 mg/dL  ?CBC With Differential/Platelet  ?Result Value Ref Range  ? WBC 7.2 3.4 - 10.8 x10E3/uL  ? RBC 4.27 4.14 - 5.80 x10E6/uL  ? Hemoglobin 13.2 13.0 - 17.7 g/dL  ? Hematocrit 38.9 37.5 - 51.0 %  ? MCV 91 79 - 97 fL  ? MCH 30.9 26.6 - 33.0 pg  ? MCHC 33.9 31.5 - 35.7 g/dL  ? RDW 15.0 11.6 - 15.4 %  ? Platelets 347 150 - 450 x10E3/uL  ? Neutrophils 63 Not Estab. %  ? Lymphs 22 Not Estab. %  ? MID 15 Not Estab. %  ? Neutrophils Absolute 4.5 1.4 - 7.0 x10E3/uL  ? Lymphocytes Absolute 1.6 0.7 - 3.1 x10E3/uL  ? MID (Absolute) 1.1 0.1 - 1.6 X10E3/uL  ?Bayer DCA Hb A1c Waived (STAT)  ?Result Value Ref Range  ? HB A1C (BAYER DCA - WAIVED) 6.5 (H) 4.8 - 5.6 %  ? ?   ? ? ?Current Outpatient Medications:  ?  acetaminophen (TYLENOL) 500 MG tablet, Take 500 mg by mouth every 6 (six) hours as needed., Disp: , Rfl:  ?  acidophilus (RISAQUAD) CAPS capsule, Take 2 capsules by mouth 3 (three) times daily., Disp: 90 capsule, Rfl: 0 ?  aspirin EC 81 MG tablet, Take 81 mg by mouth once. , Disp: , Rfl:  ?  calcium-vitamin D (OSCAL WITH D) 500-200 MG-UNIT tablet, Take 1 tablet by mouth., Disp: , Rfl:  ?  colchicine 0.6 MG tablet, Take 1 tablet (0.6 mg total) by mouth daily., Disp: 30 tablet, Rfl: 1 ?  fenofibrate (TRICOR) 145 MG tablet, Take 1 tablet (145 mg total) by mouth daily. Hold till your C. difficile infection completely resolves, Disp: , Rfl:  ?  fexofenadine (ALLEGRA) 180 MG tablet, Take 180 mg by mouth as needed for allergies. , Disp: , Rfl:  ?  lactobacillus acidophilus & bulgar (LACTINEX) chewable tablet, Chew 1 tablet by mouth 3 (three) times daily with meals., Disp: 30 tablet, Rfl: 1 ?  levothyroxine (SYNTHROID) 25 MCG tablet, Take 1 tablet (25 mcg total) by mouth daily., Disp: 90 tablet, Rfl: 2 ?  methylPREDNISolone (MEDROL DOSEPAK) 4 MG TBPK tablet, Use as directed, Disp: 1 each, Rfl: 0 ?  Multiple  Vitamin (MULTI-VITAMINS) TABS, Take by mouth daily. , Disp: , Rfl:  ?  ondansetron (ZOFRAN) 4  MG tablet, Take 1 tablet (4 mg total) by mouth every 6 (six) hours as needed for nausea., Disp: 20 tablet, Rfl: 0 ?  pantoprazole (PROTONIX) 40 MG tablet, Take 1 tablet by mouth once daily, Disp: 90 tablet, Rfl: 2  ? ? ?Assessment & Plan:  ? ?Cdiff : Recurrent diarrhea every 3-4 hours small amounts now. Had to cancel his nephrology. ?To start BRAT diet x 48 hours.  ?To recheck Cdiff stool / O and P to come In for testing.  ?Wil need to fu with Dr. Marius Ditch , d/w their office can be seen sooner than June 1 st as he has recurent diarrhea.  ?Appreciate input and help.  ?If symptoms worsen will need to go to the ER.  ?Advised to call the office or go to the ER if she develops chest pain , any new onset of bleeding / black stools or  fresh red blood from any orifice,  shortness of breath dizziness or tingling or numbness.  Pt verbalized understanding of such. ? ?Problem List Items Addressed This Visit   ? ?  ? Other  ? Diarrhea - Primary  ? Relevant Orders  ? Cdiff NAA+O+P+Stool Culture  ?  ? ?Orders Placed This Encounter  ?Procedures  ? Cdiff NAA+O+P+Stool Culture  ?  ? ?No orders of the defined types were placed in this encounter. ?  ? ?Follow up plan: ?Return in about 1 week (around 03/18/2022). ? ? ?

## 2022-03-12 ENCOUNTER — Encounter: Payer: Self-pay | Admitting: Internal Medicine

## 2022-03-15 ENCOUNTER — Other Ambulatory Visit: Payer: Self-pay | Admitting: Internal Medicine

## 2022-03-16 ENCOUNTER — Other Ambulatory Visit: Payer: Self-pay

## 2022-03-16 ENCOUNTER — Telehealth: Payer: Self-pay | Admitting: Internal Medicine

## 2022-03-16 MED ORDER — LACTINEX PO CHEW
1.0000 | CHEWABLE_TABLET | Freq: Three times a day (TID) | ORAL | 1 refills | Status: DC | PRN
Start: 2022-03-16 — End: 2022-06-22

## 2022-03-16 NOTE — Telephone Encounter (Signed)
Pt wife is calling back to follow up on medication refill.  ?

## 2022-03-16 NOTE — Telephone Encounter (Signed)
Copied from [place] [phone]. Topic: General - Other ?>> Mar 16, 2022  2:20 PM McGill, Nelva Bush wrote: ?Reason for CRM: Pt wife stated she has everything she needs to collect pt specimens, however, pt hasn't been able to fill up vials. Pt wife is unsure if she should bring them back or what to do. ? ?Pt wife requesting a call back. ?

## 2022-03-17 DIAGNOSIS — A09 Infectious gastroenteritis and colitis, unspecified: Secondary | ICD-10-CM | POA: Diagnosis not present

## 2022-03-17 NOTE — Telephone Encounter (Signed)
Requested medication (s) are due for refill today: no ? ?Requested medication (s) are on the active medication list: yes ? ?Last refill:  03/16/22 ? ?Future visit scheduled: yes ? ?Notes to clinic:  Duplicate request, medication was refilled 03/16/22 by PCP office. ? ? ?  ?Requested Prescriptions  ?Pending Prescriptions Disp Refills  ? lactobacillus acidophilus & bulgar (LACTINEX) chewable tablet [Pharmacy Med Name: Lactinex Oral Tablet Chewable] 30 tablet 0  ?  Sig: CHEW 1 TABLET BY MOUTH THREE TIMES DAILY WITH MEALS  ?  ? Endocrinology:  Nutritional Agents Passed - 03/16/2022  2:15 PM  ?  ?  Passed - Valid encounter within last 12 months  ?  Recent Outpatient Visits   ? ?      ? 6 days ago Diarrhea of infectious origin  ? [facility] Vigg, Avanti, MD  ? 1 week ago Localized swelling of toe of right foot  ? [facility] Vigg, Avanti, MD  ? 3 weeks ago Diarrhea, unspecified type  ? [facility] Vigg, Avanti, MD  ? 2 months ago Primary hypertension  ? [facility] Vigg, Avanti, MD  ? 3 months ago Hypothyroidism, unspecified type  ? [facility] Vigg, Avanti, MD  ? ?  ?  ?Future Appointments   ? ?        ? In 1 week Vigg, Avanti, MD [facility]  ? In 2 weeks Vanga, Tally Due, MD [place]  ? In 3 months Vigg, Avanti, MD [facility]  ? ?  ? ? ?  ?  ?  ? ? ?

## 2022-03-18 ENCOUNTER — Other Ambulatory Visit: Payer: Self-pay | Admitting: Internal Medicine

## 2022-03-18 DIAGNOSIS — A09 Infectious gastroenteritis and colitis, unspecified: Secondary | ICD-10-CM | POA: Diagnosis not present

## 2022-03-22 ENCOUNTER — Other Ambulatory Visit: Payer: Self-pay | Admitting: Family Medicine

## 2022-03-22 LAB — CDIFF NAA+O+P+STOOL CULTURE
E coli, Shiga toxin Assay: NEGATIVE
Toxigenic C. Difficile by PCR: POSITIVE — AB

## 2022-03-22 MED ORDER — VANCOMYCIN HCL 125 MG PO CAPS: 125.0000 mg | ORAL_CAPSULE | Freq: Four times a day (QID) | ORAL | 0 refills | Status: DC

## 2022-03-23 LAB — OVA AND PARASITE EXAMINATION

## 2022-03-30 ENCOUNTER — Encounter: Payer: Self-pay | Admitting: Internal Medicine

## 2022-03-30 ENCOUNTER — Ambulatory Visit (INDEPENDENT_AMBULATORY_CARE_PROVIDER_SITE_OTHER): Payer: Medicare Other | Admitting: Internal Medicine

## 2022-03-30 VITALS — BP 116/71 | HR 64 | Temp 97.9°F | Ht 68.11 in | Wt 157.2 lb

## 2022-03-30 DIAGNOSIS — A0472 Enterocolitis due to Clostridium difficile, not specified as recurrent: Secondary | ICD-10-CM | POA: Diagnosis not present

## 2022-03-30 HISTORY — DX: Enterocolitis due to Clostridium difficile, not specified as recurrent: A04.72

## 2022-03-30 NOTE — Progress Notes (Signed)
? ?BP 116/71   Pulse 64   Temp 97.9 ?F (36.6 ?C) (Oral)   Ht 5' 8.11" (1.73 m)   Wt 157 lb 3.2 oz (71.3 kg)   SpO2 97%   BMI 23.83 kg/m?   ? ?Subjective:  ? ? Patient ID: Vincent Koch, male    DOB: 1934-11-19, 88 y.o.   MRN: [id number] ? ?Chief Complaint  ?Patient presents with  ? Ulcerative Colitis  ?  Has not seen GI yet, seeing them on Thursday, Diarrhea has resolved.   ? ? ?HPI: ?Vincent Koch is a 88 y.o. male ? ?Diarrhea  ?Chronicity: is on vancomycin x 10 days now. 5/1 -- second course of vancomycin was started after pt was still cdiff positive. Episode onset: no more diarrhea now. seems to be close to normal never had blood and no vomiting. The problem has been gradually improving. Pertinent negatives include no abdominal pain, arthralgias, bloating, chills, coughing, fever, headaches, increased  flatus, myalgias, sweats, URI, vomiting or weight loss.  ? ?Chief Complaint  ?Patient presents with  ? Ulcerative Colitis  ?  Has not seen GI yet, seeing them on Thursday, Diarrhea has resolved.   ? ? ?Relevant past medical, surgical, family and social history reviewed and updated as indicated. Interim medical history since our last visit reviewed. ?Allergies and medications reviewed and updated. ? ?Review of Systems  ?Constitutional:  Negative for chills, fever and weight loss.  ?Respiratory:  Negative for cough.   ?Gastrointestinal:  Positive for diarrhea. Negative for abdominal pain, bloating, flatus and vomiting.  ?Musculoskeletal:  Negative for arthralgias and myalgias.  ?Neurological:  Negative for headaches.  ? ?Per HPI unless specifically indicated above ? ?   ?Objective:  ?  ?BP 116/71   Pulse 64   Temp 97.9 ?F (36.6 ?C) (Oral)   Ht 5' 8.11" (1.73 m)   Wt 157 lb 3.2 oz (71.3 kg)   SpO2 97%   BMI 23.83 kg/m?   ?Wt Readings from Last 3 Encounters:  ?04/01/22 156 lb 6 oz (70.9 kg)  ?03/30/22 157 lb 3.2 oz (71.3 kg)  ?03/08/22 161 lb 6.4 oz (73.2 kg)  ?  ?Physical Exam ?Vitals and nursing  note reviewed.  ?Constitutional:   ?   General: He is not in acute distress. ?   Appearance: Normal appearance. He is not ill-appearing or diaphoretic.  ?HENT:  ?   Head: Normocephalic and atraumatic.  ?   Right Ear: Tympanic membrane and external ear normal. There is no impacted cerumen.  ?   Left Ear: External ear normal.  ?   Nose: No congestion or rhinorrhea.  ?   Mouth/Throat:  ?   Pharynx: No oropharyngeal exudate or posterior oropharyngeal erythema.  ?Eyes:  ?   Conjunctiva/sclera: Conjunctivae normal.  ?   Pupils: Pupils are equal, round, and reactive to light.  ?Cardiovascular:  ?   Rate and Rhythm: Normal rate and regular rhythm.  ?   Heart sounds: No murmur heard. ?  No friction rub. No gallop.  ?Pulmonary:  ?   Effort: No respiratory distress.  ?   Breath sounds: No stridor. No wheezing or rhonchi.  ?Chest:  ?   Chest wall: No tenderness.  ?Abdominal:  ?   General: Abdomen is flat. Bowel sounds are normal.  ?   Palpations: Abdomen is soft. There is no mass.  ?   Tenderness: There is no abdominal tenderness.  ?Musculoskeletal:  ?   Cervical back: Normal range of motion and  neck supple. No rigidity or tenderness.  ?   Left lower leg: No edema.  ?Skin: ?   General: Skin is warm and dry.  ?Neurological:  ?   Mental Status: He is alert.  ? ? ?Results for orders placed or performed in visit on 03/18/22  ?Ova and parasite examination  ? Specimen: Per Rectum; Stool  ? ST  ?Result Value Ref Range  ? OVA + PARASITE EXAM Final report   ? O&P result 1 Comment   ? ?   ? ? ?Current Outpatient Medications:  ?  acetaminophen (TYLENOL) 500 MG tablet, Take 500 mg by mouth every 6 (six) hours as needed., Disp: , Rfl:  ?  acidophilus (RISAQUAD) CAPS capsule, Take 2 capsules by mouth 3 (three) times daily., Disp: 90 capsule, Rfl: 0 ?  aspirin EC 81 MG tablet, Take 81 mg by mouth once. , Disp: , Rfl:  ?  calcium-vitamin D (OSCAL WITH D) 500-200 MG-UNIT tablet, Take 1 tablet by mouth., Disp: , Rfl:  ?  colchicine 0.6 MG  tablet, Take 1 tablet (0.6 mg total) by mouth daily., Disp: 30 tablet, Rfl: 1 ?  fenofibrate (TRICOR) 145 MG tablet, Take 1 tablet (145 mg total) by mouth daily. Hold till your C. difficile infection completely resolves, Disp: , Rfl:  ?  fexofenadine (ALLEGRA) 180 MG tablet, Take 180 mg by mouth as needed for allergies. , Disp: , Rfl:  ?  lactobacillus acidophilus & bulgar (LACTINEX) chewable tablet, Chew 1 tablet by mouth 3 (three) times daily with meals as needed., Disp: 42 tablet, Rfl: 1 ?  levothyroxine (SYNTHROID) 25 MCG tablet, Take 1 tablet (25 mcg total) by mouth daily., Disp: 90 tablet, Rfl: 2 ?  Multiple Vitamin (MULTI-VITAMINS) TABS, Take by mouth daily. , Disp: , Rfl:  ?  ondansetron (ZOFRAN) 4 MG tablet, Take 1 tablet (4 mg total) by mouth every 6 (six) hours as needed for nausea., Disp: 20 tablet, Rfl: 0 ?  pantoprazole (PROTONIX) 40 MG tablet, Take 1 tablet by mouth once daily, Disp: 90 tablet, Rfl: 2 ?  vancomycin (VANCOCIN) 125 MG capsule, Take 1 capsule (125 mg total) by mouth 2 (two) times daily for 7 days, THEN 1 capsule (125 mg total) daily for 7 days, THEN 1 capsule (125 mg total) every other day for 21 days., Disp: 33 capsule, Rfl: 0  ? ? ?Assessment & Plan:  ?Cdiff diarrhea :  ?Will need to fu with GI this Thursday  ?To complete course of vancomycin ?Cscope  2020  ?To fu with GI for further work up ?? Needs FMT  ? ?Problem List Items Addressed This Visit   ? ?  ? Digestive  ? RESOLVED: Clostridium difficile diarrhea - Primary  ?  ? ?No orders of the defined types were placed in this encounter. ?  ? ?No orders of the defined types were placed in this encounter. ?  ? ?Follow up plan: ?Return in about 3 weeks (around 04/20/2022). ? ?

## 2022-04-01 ENCOUNTER — Ambulatory Visit (INDEPENDENT_AMBULATORY_CARE_PROVIDER_SITE_OTHER): Payer: Medicare Other | Admitting: Gastroenterology

## 2022-04-01 ENCOUNTER — Encounter: Payer: Self-pay | Admitting: Gastroenterology

## 2022-04-01 VITALS — BP 108/64 | HR 65 | Temp 97.6°F | Ht 64.0 in | Wt 156.4 lb

## 2022-04-01 DIAGNOSIS — A0471 Enterocolitis due to Clostridium difficile, recurrent: Secondary | ICD-10-CM | POA: Diagnosis not present

## 2022-04-01 MED ORDER — VANCOMYCIN HCL 125 MG PO CAPS: ORAL_CAPSULE | ORAL | 0 refills | Status: DC

## 2022-04-01 NOTE — Progress Notes (Signed)
?  ?Vincent Darby, MD ?[address]  ?[address]  ?[place], [place] [address]  ?Main: [phone]  ?Fax: [phone] ? ? ? ?Gastroenterology Consultation ? ?Referring Provider:     Charlynne Cousins, MD ?Primary Care Physician:  Charlynne Cousins, MD ?Primary Gastroenterologist:  Dr. Cephas Koch ?Reason for Consultation: Recurrent C. difficile colitis ?      ? HPI:   ?Vincent Koch is a 88 y.o. male referred by Dr. Charlynne Cousins, MD  for consultation & management of recurrent C. difficile colitis.  Patient was admitted to [facility] on 02/21/2022 secondary to 2 weeks history of generalized abdominal pain associated with nonbloody diarrhea, decreased appetite and nausea.  He underwent CT abdomen and pelvis with contrast which revealed diffuse colonic wall thickening and stool studies were positive for C. difficile infection.  He had marked leukocytosis, WBC count 28.3 he also had mild AKI, responded to IV fluids.  He was treated with oral vancomycin and IV metronidazole and was discharged home on oral vancomycin and metronidazole for 10 days course of antibiotics.  Patient's diarrhea resolved, however recurred, repeat C. difficile on 03/17/2022 came back positive.  Ova and parasites were negative, stool culture for Salmonella, Campylobacter, E. coli were negative.  Patient was started on second course of oral vancomycin 125 mg 4 times daily for 10 days.  Patient reports that his diarrhea has finally resolved 2 days ago.  He is last dose of vancomycin is today.  Patient is accompanied by his wife today.  He reports that his appetite has improved and does not have any GI symptoms and feels normal. ? ?NSAIDs: None ? ?Antiplts/Anticoagulants/Anti thrombotics: None ? ?GI Procedures:  ?EGD and colonoscopy 12/11/2018 for chronic diarrhea, pathology was unremarkable for colitis. ? ?Past Medical History:  ?Diagnosis Date  ? Bradycardia   ? Chronic kidney disease   ? GERD (gastroesophageal reflux disease)   ? Hyperlipemia   ?  Hypertension   ? Neuropathy   ? toes  ? Osteoporosis   ? Peripheral neuropathy   ? Thyroid disease   ? ? ?Past Surgical History:  ?Procedure Laterality Date  ? CATARACT EXTRACTION W/PHACO Right 06/27/2019  ? Procedure: CATARACT EXTRACTION PHACO AND INTRAOCULAR LENS PLACEMENT ([place])  RIGHT;  Surgeon: Leandrew Koyanagi, MD;  Location: [place];  Service: Ophthalmology;  Laterality: Right;  VISION BLUE  ? COLONOSCOPY WITH PROPOFOL N/A 12/11/2018  ? Procedure: COLONOSCOPY WITH PROPOFOL;  Surgeon: Virgel Manifold, MD;  Location: [facility];  Service: Endoscopy;  Laterality: N/A;  ? ESOPHAGEAL DILATION N/A 06/14/2016  ? Procedure: ESOPHAGOGASTRODUODENOSCOPY (EGD) WITH PROPOFOL with dilation;  Surgeon: Lucilla Lame, MD;  Location: [place];  Service: Endoscopy;  Laterality: N/A;  ? ESOPHAGOGASTRODUODENOSCOPY (EGD) WITH PROPOFOL N/A 07/15/2016  ? Procedure: ESOPHAGOGASTRODUODENOSCOPY (EGD) WITH DILATION;  Surgeon: Lucilla Lame, MD;  Location: [place];  Service: Endoscopy;  Laterality: N/A;  ? ESOPHAGOGASTRODUODENOSCOPY (EGD) WITH PROPOFOL N/A 12/11/2018  ? Procedure: ESOPHAGOGASTRODUODENOSCOPY (EGD) WITH PROPOFOL;  Surgeon: Virgel Manifold, MD;  Location: [facility];  Service: Endoscopy;  Laterality: N/A;  ? HERNIA REPAIR    ? PARATHYROIDECTOMY    ? ? ?Current Outpatient Medications:  ?  acetaminophen (TYLENOL) 500 MG tablet, Take 500 mg by mouth every 6 (six) hours as needed., Disp: , Rfl:  ?  acidophilus (RISAQUAD) CAPS capsule, Take 2 capsules by mouth 3 (three) times daily., Disp: 90 capsule, Rfl: 0 ?  aspirin EC 81 MG tablet, Take 81 mg by mouth once. ,  Disp: , Rfl:  ?  calcium-vitamin D (OSCAL WITH D) 500-200 MG-UNIT tablet, Take 1 tablet by mouth., Disp: , Rfl:  ?  colchicine 0.6 MG tablet, Take 1 tablet (0.6 mg total) by mouth daily., Disp: 30 tablet, Rfl: 1 ?  fenofibrate (TRICOR) 145 MG tablet, Take 1 tablet (145 mg total) by mouth daily. Hold till your C. difficile  infection completely resolves, Disp: , Rfl:  ?  fexofenadine (ALLEGRA) 180 MG tablet, Take 180 mg by mouth as needed for allergies. , Disp: , Rfl:  ?  lactobacillus acidophilus & bulgar (LACTINEX) chewable tablet, Chew 1 tablet by mouth 3 (three) times daily with meals as needed., Disp: 42 tablet, Rfl: 1 ?  levothyroxine (SYNTHROID) 25 MCG tablet, Take 1 tablet (25 mcg total) by mouth daily., Disp: 90 tablet, Rfl: 2 ?  Multiple Vitamin (MULTI-VITAMINS) TABS, Take by mouth daily. , Disp: , Rfl:  ?  ondansetron (ZOFRAN) 4 MG tablet, Take 1 tablet (4 mg total) by mouth every 6 (six) hours as needed for nausea., Disp: 20 tablet, Rfl: 0 ?  pantoprazole (PROTONIX) 40 MG tablet, Take 1 tablet by mouth once daily, Disp: 90 tablet, Rfl: 2 ?  vancomycin (VANCOCIN) 125 MG capsule, Take 1 capsule (125 mg total) by mouth 2 (two) times daily for 7 days, THEN 1 capsule (125 mg total) daily for 7 days, THEN 1 capsule (125 mg total) every other day for 21 days., Disp: 33 capsule, Rfl: 0 ? ? ? ?Family History  ?Problem Relation Age of Onset  ? Stroke Mother   ? Throat cancer Father   ?  ? ?Social History  ? ?Tobacco Use  ? Smoking status: Never  ? Smokeless tobacco: Never  ?Vaping Use  ? Vaping Use: Never used  ?Substance Use Topics  ? Alcohol use: Never  ? Drug use: Never  ? ? ?Allergies as of 04/01/2022  ? (No Known Allergies)  ? ? ?Review of Systems:    ?All systems reviewed and negative except where noted in HPI. ? ? Physical Exam:  ?BP 108/64 (BP Location: Left Arm, Patient Position: Sitting, Cuff Size: Normal)   Pulse 65   Temp 97.6 ?F (36.4 ?C) (Oral)   Ht '5\' 4"'$  (1.626 m)   Wt 156 lb 6 oz (70.9 kg)   BMI 26.84 kg/m?  ?No LMP for male patient. ? ?General:   Alert,  Well-developed, well-nourished, pleasant and cooperative in NAD ?Head:  Normocephalic and atraumatic. ?Eyes:  Sclera clear, no icterus.   Conjunctiva pink. ?Ears:  Normal auditory acuity. ?Nose:  No deformity, discharge, or lesions. ?Mouth:  No deformity or  lesions,oropharynx pink & moist. ?Neck:  Supple; no masses or thyromegaly. ?Lungs:  Respirations even and unlabored.  Clear throughout to auscultation.   No wheezes, crackles, or rhonchi. No acute distress. ?Heart:  Regular rate and rhythm; no murmurs, clicks, rubs, or gallops. ?Abdomen:  Normal bowel sounds. Soft, non-tender and non-distended without masses, hepatosplenomegaly or hernias noted.  No guarding or rebound tenderness.   ?Rectal: Not performed ?Msk:  Symmetrical without gross deformities. Good, equal movement & strength bilaterally. ?Pulses:  Normal pulses noted. ?Extremities:  No clubbing or edema.  No cyanosis. ?Neurologic:  Alert and oriented x3;  grossly normal neurologically. ?Skin:  Intact without significant lesions or rashes. No jaundice. ?Psych:  Alert and cooperative. Normal mood and affect. ? ?Imaging Studies: ?Reviewed ? ?Assessment and Plan:  ? ?Vincent Koch is a 88 y.o. pleasant Caucasian male with hypertension, hyperlipidemia, CKD, who  is diagnosed with first episode of C. difficile colitis on 02/22/2022 treated with vancomycin and metronidazole, followed by recurrence of diarrhea, stool study positive for C. difficile on 03/17/2022, started on oral vancomycin 125 mg 4 times daily.  Diarrhea has currently resolved ? ?Recurrent C. difficile infection ?Continue oral vancomycin '125mg'$  taper the dose as directed ?Discussed with patient regarding FMT by [facility] and patient is agreeable.  Will initiate the paperwork for approval ? ?Elevated chromogranin A levels: ?Evaluated by oncology in 05/2021 no concerning evidence for carcinoid syndrome/tumor, no further work-up is recommended ? ? ?Follow up as needed ? ? ?Vincent Darby, MD ? ?

## 2022-04-01 NOTE — Patient Instructions (Signed)
Oral vancomycin as below ?125 mg orally 2 times daily for 7 days, then ?125 mg orally once daily for 7 days, then ?125 mg orally every 2 to 3 days for 4 weeks ? ? ?

## 2022-04-02 ENCOUNTER — Telehealth: Payer: Self-pay

## 2022-04-02 NOTE — Telephone Encounter (Signed)
Called patient and informed him that he has already met his deductible and he is good to have the procedure on 04/29/2022.  ?Please order for 04/29/22 ?

## 2022-04-02 NOTE — Telephone Encounter (Signed)
Taphnesse from [employer] is calling to get patient secondary insurance because when it was fax they could not read the ID number. Gave her this information and she said she would get the benefits and let us know  ?

## 2022-04-05 NOTE — Telephone Encounter (Signed)
Talk to Taphneese and she states Original medicare is his primary and has met dubitable. [employer] supplement will pick up what medicare does not cover with no PA  ?

## 2022-04-20 ENCOUNTER — Ambulatory Visit (INDEPENDENT_AMBULATORY_CARE_PROVIDER_SITE_OTHER): Payer: Medicare Other | Admitting: Internal Medicine

## 2022-04-20 ENCOUNTER — Encounter: Payer: Self-pay | Admitting: Internal Medicine

## 2022-04-20 VITALS — BP 126/74 | HR 63 | Temp 97.6°F | Ht 68.11 in | Wt 160.2 lb

## 2022-04-20 DIAGNOSIS — K529 Noninfective gastroenteritis and colitis, unspecified: Secondary | ICD-10-CM | POA: Diagnosis not present

## 2022-04-20 DIAGNOSIS — H6121 Impacted cerumen, right ear: Secondary | ICD-10-CM | POA: Diagnosis not present

## 2022-04-20 MED ORDER — DEBROX 6.5 % OT SOLN: 5.0000 [drp] | Freq: Two times a day (BID) | OTIC | 0 refills | Status: DC

## 2022-04-20 NOTE — Progress Notes (Signed)
There were no vitals taken for this visit.   Subjective:    Patient ID: Vincent Koch, male    DOB: 09-19-1934, 88 y.o.   MRN: [id number]  No chief complaint on file.   HPI: [name] is a 88 y.o. male  June 8th is going to get an enema of   Rebyota sec to recurrent Cdiff. No abdominal pain / no diarrhea was rx x 2 vancomycin.     No chief complaint on file.   Relevant past medical, surgical, family and social history reviewed and updated as indicated. Interim medical history since our last visit reviewed. Allergies and medications reviewed and updated.  Review of Systems  Per HPI unless specifically indicated above     Objective:    There were no vitals taken for this visit.  Wt Readings from Last 3 Encounters:  04/01/22 156 lb 6 oz (70.9 kg)  03/30/22 157 lb 3.2 oz (71.3 kg)  03/08/22 161 lb 6.4 oz (73.2 kg)    Physical Exam  Results for orders placed or performed in visit on 03/18/22  Ova and parasite examination   Specimen: Per Rectum; Stool   ST  Result Value Ref Range   OVA + PARASITE EXAM Final report    O&P result 1 Comment         Current Outpatient Medications:  .  acetaminophen (TYLENOL) 500 MG tablet, Take 500 mg by mouth every 6 (six) hours as needed., Disp: , Rfl:  .  acidophilus (RISAQUAD) CAPS capsule, Take 2 capsules by mouth 3 (three) times daily., Disp: 90 capsule, Rfl: 0 .  aspirin EC 81 MG tablet, Take 81 mg by mouth once. , Disp: , Rfl:  .  calcium-vitamin D (OSCAL WITH D) 500-200 MG-UNIT tablet, Take 1 tablet by mouth., Disp: , Rfl:  .  colchicine 0.6 MG tablet, Take 1 tablet (0.6 mg total) by mouth daily., Disp: 30 tablet, Rfl: 1 .  fenofibrate (TRICOR) 145 MG tablet, Take 1 tablet (145 mg total) by mouth daily. Hold till your C. difficile infection completely resolves, Disp: , Rfl:  .  fexofenadine (ALLEGRA) 180 MG tablet, Take 180 mg by mouth as needed for allergies. , Disp: , Rfl:  .  lactobacillus acidophilus & bulgar  (LACTINEX) chewable tablet, Chew 1 tablet by mouth 3 (three) times daily with meals as needed., Disp: 42 tablet, Rfl: 1 .  levothyroxine (SYNTHROID) 25 MCG tablet, Take 1 tablet (25 mcg total) by mouth daily., Disp: 90 tablet, Rfl: 2 .  Multiple Vitamin (MULTI-VITAMINS) TABS, Take by mouth daily. , Disp: , Rfl:  .  ondansetron (ZOFRAN) 4 MG tablet, Take 1 tablet (4 mg total) by mouth every 6 (six) hours as needed for nausea., Disp: 20 tablet, Rfl: 0 .  pantoprazole (PROTONIX) 40 MG tablet, Take 1 tablet by mouth once daily, Disp: 90 tablet, Rfl: 2 .  vancomycin (VANCOCIN) 125 MG capsule, Take 1 capsule (125 mg total) by mouth 2 (two) times daily for 7 days, THEN 1 capsule (125 mg total) daily for 7 days, THEN 1 capsule (125 mg total) every other day for 21 days., Disp: 33 capsule, Rfl: 0    Assessment & Plan:  Recurrent C. difficile infection/ colitis : To have FMT on ryebota for such . Ct abdomen and pelvis with contrast which revealed diffuse colonic wall thickening.   Problem List Items Addressed This Visit   None    No orders of the defined types were placed in this  encounter.    No orders of the defined types were placed in this encounter.    Follow up plan: No follow-ups on file.

## 2022-04-22 ENCOUNTER — Other Ambulatory Visit: Payer: Self-pay

## 2022-04-22 ENCOUNTER — Ambulatory Visit: Payer: Medicare Other | Admitting: Gastroenterology

## 2022-04-22 ENCOUNTER — Ambulatory Visit
Admission: RE | Admit: 2022-04-22 | Discharge: 2022-04-22 | Disposition: A | Discharge status: Home / Self Care | Payer: Medicare Other | Attending: Gastroenterology | Admitting: Gastroenterology

## 2022-04-22 ENCOUNTER — Ambulatory Visit
Admission: RE | Admit: 2022-04-22 | Discharge: 2022-04-22 | Disposition: A | Discharge status: Home / Self Care | Payer: Medicare Other | Source: Ambulatory Visit | Attending: Gastroenterology | Admitting: Gastroenterology

## 2022-04-22 DIAGNOSIS — R140 Abdominal distension (gaseous): Secondary | ICD-10-CM | POA: Diagnosis not present

## 2022-04-22 DIAGNOSIS — A0471 Enterocolitis due to Clostridium difficile, recurrent: Secondary | ICD-10-CM | POA: Insufficient documentation

## 2022-04-22 NOTE — Progress Notes (Signed)
Dr. Neomia Dear message Dr. Lorin Glass Dr. Marius Ditch this pt was here yesterday and I wanted to let uou know he still continues to have some abdominal distention no diarrhea tho, I wanted to ask you what you thought was the cause of his Cdiff, he was NOT on any antibiotics Per Dr. Marius Ditch please order KUB and tell him to get it done today, Dx: h/o C Diff, Abdominal distension Patient wife verbalized understanding of instructions. She will have him go today

## 2022-04-26 ENCOUNTER — Ambulatory Visit (INDEPENDENT_AMBULATORY_CARE_PROVIDER_SITE_OTHER): Payer: Medicare Other

## 2022-04-26 DIAGNOSIS — H6121 Impacted cerumen, right ear: Secondary | ICD-10-CM

## 2022-04-27 ENCOUNTER — Telehealth: Payer: Self-pay

## 2022-04-27 NOTE — Telephone Encounter (Signed)
-----   Message from Lin Landsman, MD sent at 04/26/2022  5:30 PM EDT ----- Vincent Koch KUB revealed moderate stool burden and normal bowel gas pattern.  If patient is not having bowel movement daily and feels distended, recommend to start taking MiraLAX 1-2 times daily  RV

## 2022-04-27 NOTE — Telephone Encounter (Signed)
Patient is having bowel movements daily. Patient wife states he is having 1 to 2 bowel movements daily that are normal. She states that she will discuss it further with provider at appointment. He has stopped antibiotic today

## 2022-04-27 NOTE — Telephone Encounter (Signed)
Called and left a message for call back  

## 2022-04-28 NOTE — Progress Notes (Signed)
Pl let pt know has moderate amount stool burden - if constipated, he needs to start otc miralax / metamucil otc.

## 2022-04-29 ENCOUNTER — Ambulatory Visit: Payer: Self-pay | Admitting: *Deleted

## 2022-04-29 ENCOUNTER — Ambulatory Visit (INDEPENDENT_AMBULATORY_CARE_PROVIDER_SITE_OTHER): Payer: Medicare Other | Admitting: Gastroenterology

## 2022-04-29 ENCOUNTER — Encounter: Payer: Self-pay | Admitting: Gastroenterology

## 2022-04-29 VITALS — BP 123/71 | HR 101 | Temp 97.7°F | Ht 68.11 in | Wt 159.1 lb

## 2022-04-29 DIAGNOSIS — A0471 Enterocolitis due to Clostridium difficile, recurrent: Secondary | ICD-10-CM

## 2022-04-29 NOTE — Progress Notes (Signed)
Cephas Darby, MD [address]  [place]  [place], [place] [address]  Main: [phone]  Fax: [phone]    Gastroenterology Consultation  Referring Provider:     Charlynne Cousins, MD Primary Care Physician:  Charlynne Cousins, MD Primary Gastroenterologist:  Dr. Cephas Darby Reason for Consultation: Recurrent C. difficile colitis        HPI:   Vincent Koch is a 88 y.o. male referred by Dr. Charlynne Cousins, MD  for consultation & management of recurrent C. difficile colitis.  Patient was admitted to [facility] on 02/21/2022 secondary to 2 weeks history of generalized abdominal pain associated with nonbloody diarrhea, decreased appetite and nausea.  He underwent CT abdomen and pelvis with contrast which revealed diffuse colonic wall thickening and stool studies were positive for C. difficile infection.  He had marked leukocytosis, WBC count 28.3 he also had mild AKI, responded to IV fluids.  He was treated with oral vancomycin and IV metronidazole and was discharged home on oral vancomycin and metronidazole for 10 days course of antibiotics.  Patient's diarrhea resolved, however recurred, repeat C. difficile on 03/17/2022 came back positive.  Ova and parasites were negative, stool culture for Salmonella, Campylobacter, E. coli were negative.  Patient was started on second course of oral vancomycin 125 mg 4 times daily for 10 days.  Patient reports that his diarrhea has finally resolved 2 days ago.  He is last dose of vancomycin is today.  Patient is accompanied by his wife today.  He reports that his appetite has improved and does not have any GI symptoms and feels normal.  Follow-up visit 04/29/2022 Patient is here to undergo FMT by Rebyota.  Patient reports abdominal bloating.  Denies any diarrhea, his stools are mushy, up to 2 times daily.  Recent abdominal x-ray for abdominal distention revealed normal bowel gas pattern.  Moderate stool burden.  Patient is accompanied by his wife today.  He  does not have any concerns today.  NSAIDs: None  Antiplts/Anticoagulants/Anti thrombotics: None  GI Procedures:  EGD and colonoscopy 12/11/2018 for chronic diarrhea, pathology was unremarkable for colitis.  Past Medical History:  Diagnosis Date   Bradycardia    Chronic kidney disease    Clostridium difficile diarrhea 03/30/2022   GERD (gastroesophageal reflux disease)    Hyperlipemia    Hypertension    Neuropathy    toes   Osteoporosis    Peripheral neuropathy    Thyroid disease     Past Surgical History:  Procedure Laterality Date   CATARACT EXTRACTION W/PHACO Right 06/27/2019   Procedure: CATARACT EXTRACTION PHACO AND INTRAOCULAR LENS PLACEMENT ([place])  RIGHT;  Surgeon: Leandrew Koyanagi, MD;  Location: [place];  Service: Ophthalmology;  Laterality: Right;  VISION BLUE   COLONOSCOPY WITH PROPOFOL N/A 12/11/2018   Procedure: COLONOSCOPY WITH PROPOFOL;  Surgeon: Virgel Manifold, MD;  Location: [facility];  Service: Endoscopy;  Laterality: N/A;   ESOPHAGEAL DILATION N/A 06/14/2016   Procedure: ESOPHAGOGASTRODUODENOSCOPY (EGD) WITH PROPOFOL with dilation;  Surgeon: Lucilla Lame, MD;  Location: [place];  Service: Endoscopy;  Laterality: N/A;   ESOPHAGOGASTRODUODENOSCOPY (EGD) WITH PROPOFOL N/A 07/15/2016   Procedure: ESOPHAGOGASTRODUODENOSCOPY (EGD) WITH DILATION;  Surgeon: Lucilla Lame, MD;  Location: [place];  Service: Endoscopy;  Laterality: N/A;   ESOPHAGOGASTRODUODENOSCOPY (EGD) WITH PROPOFOL N/A 12/11/2018   Procedure: ESOPHAGOGASTRODUODENOSCOPY (EGD) WITH PROPOFOL;  Surgeon: Virgel Manifold, MD;  Location: [facility];  Service: Endoscopy;  Laterality: N/A;   HERNIA REPAIR  PARATHYROIDECTOMY      Current Outpatient Medications:    acetaminophen (TYLENOL) 500 MG tablet, Take 500 mg by mouth every 6 (six) hours as needed., Disp: , Rfl:    acidophilus (RISAQUAD) CAPS capsule, Take 2 capsules by mouth 3 (three) times daily.,  Disp: 90 capsule, Rfl: 0   aspirin EC 81 MG tablet, Take 81 mg by mouth once. , Disp: , Rfl:    calcium-vitamin D (OSCAL WITH D) 500-200 MG-UNIT tablet, Take 1 tablet by mouth., Disp: , Rfl:    carbamide peroxide (DEBROX) 6.5 % OTIC solution, Place 5 drops into the right ear 2 (two) times daily., Disp: 15 mL, Rfl: 0   colchicine 0.6 MG tablet, Take 1 tablet (0.6 mg total) by mouth daily., Disp: 30 tablet, Rfl: 1   fenofibrate (TRICOR) 145 MG tablet, Take 1 tablet (145 mg total) by mouth daily. Hold till your C. difficile infection completely resolves, Disp: , Rfl:    fexofenadine (ALLEGRA) 180 MG tablet, Take 180 mg by mouth as needed for allergies. , Disp: , Rfl:    lactobacillus acidophilus & bulgar (LACTINEX) chewable tablet, Chew 1 tablet by mouth 3 (three) times daily with meals as needed., Disp: 42 tablet, Rfl: 1   levothyroxine (SYNTHROID) 25 MCG tablet, Take 1 tablet (25 mcg total) by mouth daily., Disp: 90 tablet, Rfl: 2   Multiple Vitamin (MULTI-VITAMINS) TABS, Take by mouth daily. , Disp: , Rfl:    ondansetron (ZOFRAN) 4 MG tablet, Take 1 tablet (4 mg total) by mouth every 6 (six) hours as needed for nausea., Disp: 20 tablet, Rfl: 0   pantoprazole (PROTONIX) 40 MG tablet, Take 1 tablet by mouth once daily, Disp: 90 tablet, Rfl: 2   vancomycin (VANCOCIN) 125 MG capsule, Take 1 capsule (125 mg total) by mouth 2 (two) times daily for 7 days, THEN 1 capsule (125 mg total) daily for 7 days, THEN 1 capsule (125 mg total) every other day for 21 days. (Patient not taking: Reported on 04/29/2022), Disp: 33 capsule, Rfl: 0    Family History  Problem Relation Age of Onset   Stroke Mother    Throat cancer Father      Social History   Tobacco Use   Smoking status: Never   Smokeless tobacco: Never  Vaping Use   Vaping Use: Never used  Substance Use Topics   Alcohol use: Never   Drug use: Never    Allergies as of 04/29/2022   (No Known Allergies)    Review of Systems:    All systems  reviewed and negative except where noted in HPI.   Physical Exam:  BP 123/71 (BP Location: Left Arm, Patient Position: Sitting, Cuff Size: Normal)   Pulse (!) 101   Temp 97.7 F (36.5 C) (Oral)   Ht 5' 8.11" (1.73 m)   Wt 159 lb 2 oz (72.2 kg)   BMI 24.12 kg/m  No LMP for male patient.  General:   Alert,  Well-developed, well-nourished, pleasant and cooperative in NAD Head:  Normocephalic and atraumatic. Eyes:  Sclera clear, no icterus.   Conjunctiva pink. Ears:  Normal auditory acuity. Nose:  No deformity, discharge, or lesions. Mouth:  No deformity or lesions,oropharynx pink & moist. Neck:  Supple; no masses or thyromegaly. Lungs:  Respirations even and unlabored.  Clear throughout to auscultation.   No wheezes, crackles, or rhonchi. No acute distress. Heart:  Regular rate and rhythm; no murmurs, clicks, rubs, or gallops. Abdomen:  Normal bowel sounds. Soft, non-tender and  moderately distended, tympanic to percussion without masses, hepatosplenomegaly or hernias noted.  No guarding or rebound tenderness.   Rectal: Not performed Msk:  Symmetrical without gross deformities. Good, equal movement & strength bilaterally. Pulses:  Normal pulses noted. Extremities:  No clubbing or edema.  No cyanosis. Neurologic:  Alert and oriented x3;  grossly normal neurologically. Skin:  Intact without significant lesions or rashes. No jaundice. Psych:  Alert and cooperative. Normal mood and affect.  Imaging Studies: Reviewed  Assessment and Plan:   Vincent Koch is a 88 y.o. pleasant Caucasian male with hypertension, hyperlipidemia, CKD, who is diagnosed with first episode of C. difficile colitis on 02/22/2022 treated with vancomycin and metronidazole, followed by recurrence of diarrhea, stool study positive for C. difficile on 03/17/2022, started on oral vancomycin 125 mg 4 times daily.  Diarrhea has currently resolved.  Does have some generalized abdominal distention.  X-ray KUB revealed  normal bowel gas pattern  Recurrent C. difficile infection Patient completed oral vancomycin '125mg'$  with taper as directed Proceed with FMT by Rebyota and patient is agreeable.  Consent obtained  Elevated chromogranin A levels: Evaluated by oncology in 05/2021 no concerning evidence for carcinoid syndrome/tumor, no further work-up is recommended   Follow up as needed   Cephas Darby, MD

## 2022-04-29 NOTE — Telephone Encounter (Signed)
Pt has gout again in the toe and spouse called and asked for a refill of methylPREDNISolone (MEDROL DOSEPAK) 4 MG TBPK Tab / she stated this is what he was prescribed two months ago when he had a flair up before / please advise      Chief Complaint: Toe pain Symptoms: Redness,warmth, mild swelling great toe of left foot. States "Just a little painful, actually better today, still sore with my shoe on." Frequency: 3-4 days ago Pertinent Negatives: Patient denies fever Disposition: '[]'$ ED /'[]'$ Urgent Care (no appt availability in office) / '[]'$ Appointment(In office/virtual)/ '[]'$  [facility]/ '[]'$ Home Care/ '[]'$ Refused Recommended Disposition /'[]'$ [facility]/ '[x]'$  Follow-up with PCP Additional Notes: States first episode in April, prescribed steroid. Requesting same be called in.States "Just like before." Assured pt NT would route to practice for PCPs review and final disposition. Please advise. Great toe pain Reason for Disposition  Pain in the big toe joint  Answer Assessment - Initial Assessment Questions 1. ONSET: "When did the pain start?"      3-4 days 2. LOCATION: "Where is the pain located?"      Left foot great toe 3. PAIN: "How bad is the pain?"    (Scale 1-10; or mild, moderate, severe)  - MILD (1-3): doesn't interfere with normal activities.   - MODERATE (4-7): interferes with normal activities (e.g., work or school) or awakens from sleep, limping.   - SEVERE (8-10): excruciating pain, unable to do any normal activities, unable to walk.      Mild, worse at night 4. WORK OR EXERCISE: "Has there been any recent work or exercise that involved this part of the body?"      No 5. CAUSE: "What do you think is causing the foot pain?"     Gout 6. OTHER SYMPTOMS: "Do you have any other symptoms?" (e.g., leg pain, rash, fever, numbness)     Red, warm to touch.  Protocols used: Foot Pain-A-AH

## 2022-04-30 NOTE — Telephone Encounter (Signed)
Routing to provider to advise.  

## 2022-05-05 ENCOUNTER — Encounter: Payer: Self-pay | Admitting: Internal Medicine

## 2022-05-05 ENCOUNTER — Telehealth (INDEPENDENT_AMBULATORY_CARE_PROVIDER_SITE_OTHER): Payer: Medicare Other | Admitting: Internal Medicine

## 2022-05-05 ENCOUNTER — Telehealth: Payer: Self-pay | Admitting: Internal Medicine

## 2022-05-05 DIAGNOSIS — M79672 Pain in left foot: Secondary | ICD-10-CM | POA: Diagnosis not present

## 2022-05-05 MED ORDER — METHYLPREDNISOLONE 4 MG PO TBPK: ORAL_TABLET | ORAL | 0 refills | Status: DC

## 2022-05-05 NOTE — Telephone Encounter (Signed)
Copied from [place] [phone]. Topic: General - Call Back - No Documentation >> May 04, 2022  4:45 PM Oley Balm E wrote: Reason for CRM: Pt's wife wants to speak to [facility] regarding a medication she requested last week. Best contact: [phone] Methyl-prednisone for his gout.

## 2022-05-05 NOTE — Telephone Encounter (Signed)
Returned patient's wife call about wanting to get a refill of prednisone for a gout flare, was last prescribed in April for gout, informed her that Dr. Neomia Dear would want to see husband in clinic, wife agreed to an appt for today at 240.

## 2022-05-05 NOTE — Patient Instructions (Addendum)
Low-Purine Eating Plan A low-purine eating plan involves making food choices to limit your purine intake. Purine is a kind of uric acid. Too much uric acid in your blood can cause certain conditions, such as gout and kidney stones. Eating a low-purine diet may help control these conditions. What are tips for following this plan? Shopping Avoid buying products that contain high-fructose corn syrup. Check for this on food labels. It is commonly found in many processed foods and soft drinks. Be sure to check for it in baked goods such as cookies, canned fruits, and cereals and cereal bars. Avoid buying veal, chicken breast with skin, lamb, and organ meats such as liver. These types of meats tend to have the highest purine content. Choose dairy products. These may lower uric acid levels. Avoid certain types of fish. Not all fish and seafood have high purine content. Examples with high purine content include anchovies, trout, tuna, sardines, and salmon. Avoid buying beverages that contain alcohol, particularly beer and hard liquor. Alcohol can affect the way your body gets rid of uric acid. Meal planning  Learn which foods do or do not affect you. If you find out that a food tends to cause your gout symptoms to flare up, avoid eating that food. You can enjoy foods that do not cause problems. If you have any questions about a food item, talk with your dietitian or health care provider. Reduce the overall amount of meat in your diet. When you do eat meat, choose ones with lower purine content. Include plenty of fruits and vegetables. Although some vegetables may have a high purine content--such as asparagus, mushrooms, spinach, or cauliflower--it has been shown that these do not contribute to uric acid blood levels as much. Consume at least 1 dairy serving a day. This has been shown to decrease uric acid levels. General information If you drink alcohol: Limit how much you have to: 0-1 drink a day for  women who are not pregnant. 0-2 drinks a day for men. Know how much alcohol is in a drink. In the [place] one drink equals one 12 oz bottle of beer (355 mL), one 5 oz glass of wine (148 mL), or one 1 oz glass of hard liquor (44 mL). Drink plenty of water. Try to drink enough to keep your urine pale yellow. Fluids can help remove uric acid from your body. Work with your health care provider and dietitian to develop a plan to achieve or maintain a healthy weight. Losing weight may help reduce uric acid in your blood. What foods are recommended? The following are some types of foods that are good choices when limiting purine intake: Fresh or frozen fruits and vegetables. Whole grains, breads, cereals, and pasta. Rice. Beans, peas, legumes. Nuts and seeds. Dairy products. Fats and oils. The items listed above may not be a complete list. Talk with a dietitian about what dietary choices are best for you. What foods are not recommended? Limit your intake of foods high in purines, including: Beer and other alcohol. Meat-based gravy or sauce. Canned or fresh fish, such as: Anchovies, sardines, herring, salmon, and tuna. Mussels and scallops. Codfish, trout, and haddock. Bacon, veal, chicken breast with skin, and lamb. Organ meats, such as: Liver or kidney. Tripe. Sweetbreads (thymus gland or pancreas). Wild game or goose. Yeast or yeast extract supplements. Drinks sweetened with high-fructose corn syrup, such as soda. Processed foods made with high-fructose corn syrup. The items listed above may not be a complete list of foods   and beverages you should limit. Contact a dietitian for more information. Summary Eating a low-purine diet may help control conditions caused by too much uric acid in the body, such as gout or kidney stones. Choose low-purine foods, limit alcohol, and limit high-fructose corn syrup. You will learn over time which foods do or do not affect you. If you find out that a  food tends to cause your gout symptoms to flare up, avoid eating that food. This information is not intended to replace advice given to you by your health care provider. Make sure you discuss any questions you have with your health care provider. Document Revised: 10/22/2021 Document Reviewed: 10/22/2021 Elsevier Patient Education  2023 Elsevier Inc. Gout  Gout is a condition that causes painful swelling of the joints. Gout is a type of inflammation of the joints (arthritis). This condition is caused by having too much uric acid in the body. Uric acid is a chemical that forms when the body breaks down substances called purines. Purines are important for building body proteins. When the body has too much uric acid, sharp crystals can form and build up inside the joints. This causes pain and swelling. Gout attacks can happen quickly and may be very painful (acute gout). Over time, the attacks can affect more joints and become more frequent (chronic gout). Gout can also cause uric acid to build up under the skin and inside the kidneys. What are the causes? This condition is caused by too much uric acid in your blood. This can happen because: Your kidneys do not remove enough uric acid from your blood. This is the most common cause. Your body makes too much uric acid. This can happen with some cancers and cancer treatments. It can also occur if your body is breaking down too many red blood cells (hemolytic anemia). You eat too many foods that are high in purines. These foods include organ meats and some seafood. Alcohol, especially beer, is also high in purines. A gout attack may be triggered by trauma or stress. What increases the risk? The following factors may make you more likely to develop this condition: Having a family history of gout. Being male and middle-aged. Being male and having gone through menopause. Taking certain medicines, including aspirin, cyclosporine, diuretics, levodopa, and  niacin. Having an organ transplant. Having certain conditions, such as: Being obese. Lead poisoning. Kidney disease. A skin condition called psoriasis. Other factors include: Losing weight too quickly. Being dehydrated. Frequently drinking alcohol, especially beer. Frequently drinking beverages that are sweetened with a type of sugar called fructose. What are the signs or symptoms? An attack of acute gout happens quickly. It usually occurs in just one joint. The most common place is the big toe. Attacks often start at night. Other joints that may be affected include joints of the feet, ankle, knee, fingers, wrist, or elbow. Symptoms of this condition may include: Severe pain. Warmth. Swelling. Stiffness. Tenderness. The affected joint may be very painful to touch. Shiny, red, or purple skin. Chills and fever. Chronic gout may cause symptoms more frequently. More joints may be involved. You may also have white or yellow lumps (tophi) on your hands or feet or in other areas near your joints. How is this diagnosed? This condition is diagnosed based on your symptoms, your medical history, and a physical exam. You may have tests, such as: Blood tests to measure uric acid levels. Removal of joint fluid with a thin needle (aspiration) to look for uric acid crystals.   X-rays to look for joint damage. How is this treated? Treatment for this condition has two phases: treating an acute attack and preventing future attacks. Acute gout treatment may include medicines to reduce pain and swelling, including: NSAIDs, such as ibuprofen. Steroids. These are strong anti-inflammatory medicines that can be taken by mouth (orally) or injected into a joint. Colchicine. This medicine relieves pain and swelling when it is taken soon after an attack. It can be given by mouth or through an IV. Preventive treatment may include: Daily use of smaller doses of NSAIDs or colchicine. Use of a medicine that reduces  uric acid levels in your blood, such as allopurinol. Changes to your diet. You may need to see a dietitian about what to eat and drink to prevent gout. Follow these instructions at home: During a gout attack  If directed, put ice on the affected area. To do this: Put ice in a plastic bag. Place a towel between your skin and the bag. Leave the ice on for 20 minutes, 2-3 times a day. Remove the ice if your skin turns bright red. This is very important. If you cannot feel pain, heat, or cold, you have a greater risk of damage to the area. Raise (elevate) the affected joint above the level of your heart as often as possible. Rest the joint as much as possible. If the affected joint is in your leg, you may be given crutches to use. Follow instructions from your health care provider about eating or drinking restrictions. Avoiding future gout attacks Follow a low-purine diet as told by your dietitian or health care provider. Avoid foods and drinks that are high in purines, including liver, kidney, anchovies, asparagus, herring, mushrooms, mussels, and beer. Maintain a healthy weight or lose weight if you are overweight. If you want to lose weight, talk with your health care provider. Do not lose weight too quickly. Start or maintain an exercise program as told by your health care provider. Eating and drinking Avoid drinking beverages that contain fructose. Drink enough fluids to keep your urine pale yellow. If you drink alcohol: Limit how much you have to: 0-1 drink a day for women who are not pregnant. 0-2 drinks a day for men. Know how much alcohol is in a drink. In the [place] one drink equals one 12 oz bottle of beer (355 mL), one 5 oz glass of wine (148 mL), or one 1 oz glass of hard liquor (44 mL). General instructions Take over-the-counter and prescription medicines only as told by your health care provider. Ask your health care provider if the medicine prescribed to you requires you to  avoid driving or using machinery. Return to your normal activities as told by your health care provider. Ask your health care provider what activities are safe for you. Keep all follow-up visits. This is important. Where to find more information National Institutes of Health: www.niams.nih.gov Contact a health care provider if you have: Another gout attack. Continuing symptoms of a gout attack after 10 days of treatment. Side effects from your medicines. Chills or a fever. Burning pain when you urinate. Pain in your lower back or abdomen. Get help right away if you: Have severe or uncontrolled pain. Cannot urinate. Summary Gout is painful swelling of the joints caused by having too much uric acid in the body. The most common site for gout to occur is in the big toe, but it can affect other joints in the body. Medicines and dietary changes can help to   prevent and treat gout attacks. This information is not intended to replace advice given to you by your health care provider. Make sure you discuss any questions you have with your health care provider. Document Revised: 08/12/2021 Document Reviewed: 08/12/2021 Elsevier Patient Education  2023 Elsevier Inc.  

## 2022-05-05 NOTE — Telephone Encounter (Signed)
Copied from [place] [id number]. Topic: General - Call Back - No Documentation >> May 04, 2022  4:45 PM Oley Balm E wrote: Reason for CRM: Pt's wife wants to speak to [facility] regarding a medication she requested last week. Best contact: [phone] Methyl-prednisone for his gout. >> May 05, 2022 10:46 AM Cyndi Bender wrote: Pt spouse requesting that Tammy return her call asap. Cb# [phone]

## 2022-05-05 NOTE — Progress Notes (Signed)
There were no vitals taken for this visit.   Subjective:    Patient ID: Vincent Koch, male    DOB: 09/08/1934, 88 y.o.   MRN: [id number]  Chief Complaint  Patient presents with   Gout Flare    Leftt foot Has had for the past week    HPI: Vincent Koch is a 88 y.o. male   This visit was completed via video visit through [place] due to the restrictions of the COVID-19 pandemic. All issues as above were discussed and addressed. Physical exam was done as above through visual confirmation on video through MyChart. If it was felt that the patient should be evaluated in the office, they were directed there. The patient verbally consented to this visit. Location of the patient: home Location of the provider: work Those involved with this call:  Provider: Charlynne Cousins, MD CMA: Frazier Butt, [place] Desk/Registration: [employer]  Time spent cahnged to [employer] visit as pt couldn't access camera - 10 minutes on the phone discussing health concerns. 15 minutes total spent in review of patient's record and preparation of their chart.     Ho gout , similar redness , puffiness and swelling1. Subtle erosive change medial aspect first metatarsal head, with overlying soft tissue swelling. Does the patient have any history of gout? 2. Mild diffuse soft tissue swelling throughout the forefoot. 3. Mild diffuse osteoarthritis. in the left foot      Chief Complaint  Patient presents with   Gout Flare    Leftt foot Has had for the past week    Relevant past medical, surgical, family and social history reviewed and updated as indicated. Interim medical history since our last visit reviewed. Allergies and medications reviewed and updated.  Review of Systems  Per HPI unless specifically indicated above     Objective:    There were no vitals taken for this visit.  Wt Readings from Last 3 Encounters:  04/29/22 159 lb 2 oz (72.2 kg)  04/20/22 160 lb 3.2 oz (72.7 kg)   04/01/22 156 lb 6 oz (70.9 kg)    Physical Exam  Results for orders placed or performed in visit on 03/18/22  Ova and parasite examination   Specimen: Per Rectum; Stool   ST  Result Value Ref Range   OVA + PARASITE EXAM Final report    O&P result 1 Comment         Current Outpatient Medications:    acetaminophen (TYLENOL) 500 MG tablet, Take 500 mg by mouth every 6 (six) hours as needed., Disp: , Rfl:    acidophilus (RISAQUAD) CAPS capsule, Take 2 capsules by mouth 3 (three) times daily., Disp: 90 capsule, Rfl: 0   aspirin EC 81 MG tablet, Take 81 mg by mouth once. , Disp: , Rfl:    calcium-vitamin D (OSCAL WITH D) 500-200 MG-UNIT tablet, Take 1 tablet by mouth., Disp: , Rfl:    carbamide peroxide (DEBROX) 6.5 % OTIC solution, Place 5 drops into the right ear 2 (two) times daily., Disp: 15 mL, Rfl: 0   colchicine 0.6 MG tablet, Take 1 tablet (0.6 mg total) by mouth daily., Disp: 30 tablet, Rfl: 1   fenofibrate (TRICOR) 145 MG tablet, Take 1 tablet (145 mg total) by mouth daily. Hold till your C. difficile infection completely resolves, Disp: , Rfl:    fexofenadine (ALLEGRA) 180 MG tablet, Take 180 mg by mouth as needed for allergies. , Disp: , Rfl:    lactobacillus acidophilus & bulgar (  LACTINEX) chewable tablet, Chew 1 tablet by mouth 3 (three) times daily with meals as needed., Disp: 42 tablet, Rfl: 1   levothyroxine (SYNTHROID) 25 MCG tablet, Take 1 tablet (25 mcg total) by mouth daily., Disp: 90 tablet, Rfl: 2   Multiple Vitamin (MULTI-VITAMINS) TABS, Take by mouth daily. , Disp: , Rfl:    ondansetron (ZOFRAN) 4 MG tablet, Take 1 tablet (4 mg total) by mouth every 6 (six) hours as needed for nausea., Disp: 20 tablet, Rfl: 0   pantoprazole (PROTONIX) 40 MG tablet, Take 1 tablet by mouth once daily, Disp: 90 tablet, Rfl: 2    Assessment & Plan:  Foot pain: ? Sec to gout Check labs. Start pt on medrol dose pak consider  allopurinol will check URIC acid levels today  Problem  List Items Addressed This Visit   None    No orders of the defined types were placed in this encounter.    No orders of the defined types were placed in this encounter.    Follow up plan: No follow-ups on file.

## 2022-05-06 ENCOUNTER — Ambulatory Visit
Admission: RE | Admit: 2022-05-06 | Discharge: 2022-05-06 | Disposition: A | Discharge status: Home / Self Care | Payer: Medicare Other | Source: Home / Self Care | Attending: Internal Medicine | Admitting: Internal Medicine

## 2022-05-06 ENCOUNTER — Ambulatory Visit
Admission: RE | Admit: 2022-05-06 | Discharge: 2022-05-06 | Disposition: A | Discharge status: Home / Self Care | Payer: Medicare Other | Source: Ambulatory Visit | Attending: Internal Medicine | Admitting: Internal Medicine

## 2022-05-06 DIAGNOSIS — Z9089 Acquired absence of other organs: Secondary | ICD-10-CM | POA: Diagnosis not present

## 2022-05-06 DIAGNOSIS — E210 Primary hyperparathyroidism: Secondary | ICD-10-CM | POA: Diagnosis not present

## 2022-05-06 DIAGNOSIS — K219 Gastro-esophageal reflux disease without esophagitis: Secondary | ICD-10-CM | POA: Diagnosis not present

## 2022-05-06 DIAGNOSIS — E340 Carcinoid syndrome: Secondary | ICD-10-CM | POA: Diagnosis not present

## 2022-05-06 DIAGNOSIS — N200 Calculus of kidney: Secondary | ICD-10-CM | POA: Diagnosis not present

## 2022-05-06 DIAGNOSIS — M79672 Pain in left foot: Secondary | ICD-10-CM | POA: Diagnosis not present

## 2022-05-06 DIAGNOSIS — M7989 Other specified soft tissue disorders: Secondary | ICD-10-CM | POA: Diagnosis not present

## 2022-05-06 DIAGNOSIS — I10 Essential (primary) hypertension: Secondary | ICD-10-CM | POA: Diagnosis not present

## 2022-05-06 DIAGNOSIS — N189 Chronic kidney disease, unspecified: Secondary | ICD-10-CM | POA: Diagnosis not present

## 2022-05-06 DIAGNOSIS — N1832 Chronic kidney disease, stage 3b: Secondary | ICD-10-CM | POA: Diagnosis not present

## 2022-05-06 DIAGNOSIS — N400 Enlarged prostate without lower urinary tract symptoms: Secondary | ICD-10-CM | POA: Diagnosis not present

## 2022-05-06 DIAGNOSIS — E785 Hyperlipidemia, unspecified: Secondary | ICD-10-CM | POA: Diagnosis not present

## 2022-06-22 ENCOUNTER — Ambulatory Visit (INDEPENDENT_AMBULATORY_CARE_PROVIDER_SITE_OTHER): Payer: Medicare Other | Admitting: Unknown Physician Specialty

## 2022-06-22 ENCOUNTER — Encounter: Payer: Self-pay | Admitting: Unknown Physician Specialty

## 2022-06-22 DIAGNOSIS — K219 Gastro-esophageal reflux disease without esophagitis: Secondary | ICD-10-CM | POA: Diagnosis not present

## 2022-06-22 DIAGNOSIS — N1831 Chronic kidney disease, stage 3a: Secondary | ICD-10-CM

## 2022-06-22 DIAGNOSIS — E785 Hyperlipidemia, unspecified: Secondary | ICD-10-CM

## 2022-06-22 DIAGNOSIS — E039 Hypothyroidism, unspecified: Secondary | ICD-10-CM

## 2022-06-22 NOTE — Assessment & Plan Note (Signed)
On 25 mcgs . Check TSH and T4 today

## 2022-06-22 NOTE — Assessment & Plan Note (Signed)
On Tricor for hypertriglyceremia.  Will check levels fasting as

## 2022-06-22 NOTE — Progress Notes (Signed)
BP 106/61   Pulse 68   Temp (!) 97.4 F (36.3 C) (Oral)   Wt 160 lb 3.2 oz (72.7 kg)   SpO2 97%   BMI 24.28 kg/m    Subjective:    Patient ID: Vincent Koch, male    DOB: 11/05/1934, 88 y.o.   MRN: [id number]  HPI: Vincent Koch is a 87 y.o. male  Chief Complaint  Patient presents with   Hyperlipidemia   Hypertension   Hypothyroidism   Pt is here with wife for f/u.  Discussed the following issues as below.  Mr Vincent Koch is very active both physically and mentally with journaling, tinkering with autos, and caring for large property  CKD stage 3.  Sees nephrology.  On no BP meds with normally low BP  Hyperlipidemia - On Tricor rx'd by Dr. Neomia Dear for elevated Triglycerides.    Hypothyroid- On 25 mcgs of TSH.  Has been euthyroid.  Feels OK  S/p c diff - Doing well following a fecal transplant.    GERD - Takes Protonix daily.  Doing well.  Unable to cut back  Relevant past medical, surgical, family and social history reviewed and updated as indicated. Interim medical history since our last visit reviewed. Allergies and medications reviewed and updated.  Review of Systems  Constitutional: Negative.   Respiratory: Negative.    Cardiovascular: Negative.   Skin: Negative.   Psychiatric/Behavioral: Negative.      Per HPI unless specifically indicated above     Objective:    BP 106/61   Pulse 68   Temp (!) 97.4 F (36.3 C) (Oral)   Wt 160 lb 3.2 oz (72.7 kg)   SpO2 97%   BMI 24.28 kg/m   Wt Readings from Last 3 Encounters:  06/22/22 160 lb 3.2 oz (72.7 kg)  04/29/22 159 lb 2 oz (72.2 kg)  04/20/22 160 lb 3.2 oz (72.7 kg)    Physical Exam Constitutional:      General: He is not in acute distress.    Appearance: Normal appearance. He is well-developed.  HENT:     Head: Normocephalic and atraumatic.  Eyes:     General: Lids are normal. No scleral icterus.       Right eye: No discharge.        Left eye: No discharge.     Conjunctiva/sclera:  Conjunctivae normal.  Neck:     Vascular: No carotid bruit or JVD.  Cardiovascular:     Rate and Rhythm: Normal rate and regular rhythm.     Heart sounds: Normal heart sounds.  Pulmonary:     Effort: Pulmonary effort is normal. No respiratory distress.     Breath sounds: Normal breath sounds.  Abdominal:     Palpations: There is no hepatomegaly or splenomegaly.  Musculoskeletal:        General: Normal range of motion.     Cervical back: Normal range of motion and neck supple.  Skin:    General: Skin is warm and dry.     Coloration: Skin is not pale.     Findings: No rash.  Neurological:     Mental Status: He is alert and oriented to person, place, and time.  Psychiatric:        Behavior: Behavior normal.        Thought Content: Thought content normal.        Judgment: Judgment normal.      Assessment & Plan:   Problem List Items Addressed This Visit  Unprioritized   CKD (chronic kidney disease) stage 3, GFR 30-59 ml/min (HCC)    Seeing Nephrology.        Relevant Orders   Comprehensive metabolic panel   Dyslipidemia    On Tricor for hypertriglyceremia.  Will check levels fasting as       Relevant Orders   Lipid Panel Piccolo, Waived   Gastroesophageal reflux disease without esophagitis    Takes Protonix daily.  Stable.  Continue present medication.        Hypothyroid    On 25 mcgs . Check TSH and T4 today      Relevant Orders   TSH   T4, free    Non-fasting.  Will get labs tomorrow morning.   Follow up plan: Return in about 6 months (around 12/23/2022).

## 2022-06-22 NOTE — Assessment & Plan Note (Signed)
Seeing Nephrology

## 2022-06-22 NOTE — Assessment & Plan Note (Signed)
Takes Protonix daily.  Stable.  Continue present medication.

## 2022-06-23 ENCOUNTER — Other Ambulatory Visit: Payer: Medicare Other

## 2022-06-23 DIAGNOSIS — E785 Hyperlipidemia, unspecified: Secondary | ICD-10-CM

## 2022-06-23 DIAGNOSIS — N1831 Chronic kidney disease, stage 3a: Secondary | ICD-10-CM

## 2022-06-23 DIAGNOSIS — E039 Hypothyroidism, unspecified: Secondary | ICD-10-CM

## 2022-06-24 LAB — T4, FREE: Free T4: 0.87 ng/dL (ref 0.82–1.77)

## 2022-06-24 LAB — COMPREHENSIVE METABOLIC PANEL
ALT: 25 IU/L (ref 0–44)
AST: 36 IU/L (ref 0–40)
Albumin/Globulin Ratio: 1.7 (ref 1.2–2.2)
Albumin: 4.3 g/dL (ref 3.7–4.7)
Alkaline Phosphatase: 59 IU/L (ref 44–121)
BUN/Creatinine Ratio: 14 (ref 10–24)
BUN: 22 mg/dL (ref 8–27)
Bilirubin Total: 0.5 mg/dL (ref 0.0–1.2)
CO2: 20 mmol/L (ref 20–29)
Calcium: 9.3 mg/dL (ref 8.6–10.2)
Chloride: 104 mmol/L (ref 96–106)
Creatinine, Ser: 1.57 mg/dL — ABNORMAL HIGH (ref 0.76–1.27)
Globulin, Total: 2.5 g/dL (ref 1.5–4.5)
Glucose: 119 mg/dL — ABNORMAL HIGH (ref 70–99)
Potassium: 4.5 mmol/L (ref 3.5–5.2)
Sodium: 140 mmol/L (ref 134–144)
Total Protein: 6.8 g/dL (ref 6.0–8.5)
eGFR: 43 mL/min/{1.73_m2} — ABNORMAL LOW (ref >59)

## 2022-06-24 LAB — TSH: TSH: 4.26 u[IU]/mL (ref 0.450–4.500)

## 2022-07-02 ENCOUNTER — Other Ambulatory Visit: Payer: Self-pay

## 2022-07-02 MED ORDER — LEVOTHYROXINE SODIUM 25 MCG PO TABS: 25.0000 ug | ORAL_TABLET | Freq: Every day | ORAL | 1 refills | Status: DC

## 2022-07-02 NOTE — Telephone Encounter (Signed)
LOV 06/22/22 with Kathrine Haddock  No future appt  TSH lab on 06/22/22  4.26  [id number] on 06/22/22  0.87

## 2022-08-10 DIAGNOSIS — I10 Essential (primary) hypertension: Secondary | ICD-10-CM | POA: Diagnosis not present

## 2022-08-10 DIAGNOSIS — E785 Hyperlipidemia, unspecified: Secondary | ICD-10-CM | POA: Diagnosis not present

## 2022-08-10 DIAGNOSIS — N189 Chronic kidney disease, unspecified: Secondary | ICD-10-CM | POA: Diagnosis not present

## 2022-08-10 DIAGNOSIS — N400 Enlarged prostate without lower urinary tract symptoms: Secondary | ICD-10-CM | POA: Diagnosis not present

## 2022-08-10 DIAGNOSIS — E210 Primary hyperparathyroidism: Secondary | ICD-10-CM | POA: Diagnosis not present

## 2022-08-10 DIAGNOSIS — Z9089 Acquired absence of other organs: Secondary | ICD-10-CM | POA: Diagnosis not present

## 2022-08-10 DIAGNOSIS — N1832 Chronic kidney disease, stage 3b: Secondary | ICD-10-CM | POA: Diagnosis not present

## 2022-08-10 DIAGNOSIS — K219 Gastro-esophageal reflux disease without esophagitis: Secondary | ICD-10-CM | POA: Diagnosis not present

## 2022-08-10 DIAGNOSIS — N200 Calculus of kidney: Secondary | ICD-10-CM | POA: Diagnosis not present

## 2022-08-10 DIAGNOSIS — E340 Carcinoid syndrome: Secondary | ICD-10-CM | POA: Diagnosis not present

## 2022-08-12 DIAGNOSIS — E340 Carcinoid syndrome: Secondary | ICD-10-CM | POA: Diagnosis not present

## 2022-08-12 DIAGNOSIS — E785 Hyperlipidemia, unspecified: Secondary | ICD-10-CM | POA: Diagnosis not present

## 2022-08-12 DIAGNOSIS — E210 Primary hyperparathyroidism: Secondary | ICD-10-CM | POA: Diagnosis not present

## 2022-08-12 DIAGNOSIS — Z9089 Acquired absence of other organs: Secondary | ICD-10-CM | POA: Diagnosis not present

## 2022-08-12 DIAGNOSIS — K219 Gastro-esophageal reflux disease without esophagitis: Secondary | ICD-10-CM | POA: Diagnosis not present

## 2022-08-12 DIAGNOSIS — N200 Calculus of kidney: Secondary | ICD-10-CM | POA: Diagnosis not present

## 2022-08-12 DIAGNOSIS — N400 Enlarged prostate without lower urinary tract symptoms: Secondary | ICD-10-CM | POA: Diagnosis not present

## 2022-08-12 DIAGNOSIS — N189 Chronic kidney disease, unspecified: Secondary | ICD-10-CM | POA: Diagnosis not present

## 2022-08-12 DIAGNOSIS — N1832 Chronic kidney disease, stage 3b: Secondary | ICD-10-CM | POA: Diagnosis not present

## 2022-08-12 DIAGNOSIS — I10 Essential (primary) hypertension: Secondary | ICD-10-CM | POA: Diagnosis not present

## 2022-09-13 ENCOUNTER — Other Ambulatory Visit: Payer: Self-pay

## 2022-09-13 MED ORDER — PANTOPRAZOLE SODIUM 40 MG PO TBEC: 40.0000 mg | DELAYED_RELEASE_TABLET | Freq: Every day | ORAL | 2 refills | Status: DC

## 2022-09-13 NOTE — Telephone Encounter (Signed)
Medication refill for Pantoprazole 40 mg last ov 06/22/22, upcoming ov no future appt . Please advise

## 2022-10-01 ENCOUNTER — Ambulatory Visit (INDEPENDENT_AMBULATORY_CARE_PROVIDER_SITE_OTHER): Payer: Medicare Other

## 2022-10-01 DIAGNOSIS — Z23 Encounter for immunization: Secondary | ICD-10-CM | POA: Diagnosis not present

## 2022-10-01 NOTE — Progress Notes (Signed)
Patient presents today for Flu vaccination, patient received in left  deltoid, patient tolerated well.

## 2022-10-17 NOTE — Patient Instructions (Signed)
Food Basics for Chronic Kidney Disease Chronic kidney disease (CKD) is when your kidneys are not working well. They cannot remove waste, fluids, and other substances from your blood the way they should. These substances can build up, which can worsen kidney damage and affect how your body works. Eating certain foods can lead to a buildup of these substances. Changing your diet can help prevent more kidney damage. Diet changes may also delay dialysis or even keep you from needing it. What nutrients should I limit? Work with your treatment team and a food expert (dietitian) to make a meal plan that's right for you. Foods you can eat and foods you should limit or avoid will depend on the stage of your kidney disease and any other health conditions you have. The items listed below are not a complete list. Talk with your dietitian to learn what is best for you. Potassium Potassium affects how steadily your heart beats. Too much potassium in your blood can cause an irregular heartbeat or even a heart attack. You may need to limit foods that are high in potassium, such as: Liquid milk and soy milk. Salt substitutes that contain potassium. Fruits like bananas, apricots, nectarines, melon, prunes, raisins, kiwi, and oranges. Vegetables, such as potatoes, sweet potatoes, yams, tomatoes, leafy greens, beets, avocado, pumpkin, and winter squash. Beans, like lima beans. Nuts. Phosphorus Phosphorus is a mineral found in your bones. You need a balance between calcium and phosphorus to build and maintain healthy bones. Too much added phosphorus from the foods you eat can pull calcium from your bones. Losing calcium can make your bones weak and more likely to break. Too much phosphorus can also make your skin itch. You may need to limit foods that are high in phosphorus or that have added phosphorus, such as: Liquid milk and dairy products. Dark-colored sodas or soft drinks. Bran cereals and  oatmeal. Protein  Protein helps you make and keep muscle. Protein also helps to repair your body's cells and tissues. One of the natural breakdown products of protein is a waste product called urea. When your kidneys are not working well, they cannot remove types of waste like urea. Reducing protein in your diet can help keep urea from building up in your blood. Depending on your stage of kidney disease, you may need to eat smaller portions of foods that are high in protein. Sources of animal protein include: Meat (all types). Fish and seafood. Poultry. Eggs. Dairy. Other protein foods include: Beans and legumes. Nuts and nut butter. Soy, like tofu.  Sodium Salt (sodium) helps to keep a healthy balance of fluids in your body. Too much salt can increase your blood pressure, which can harm your heart and lungs. Extra salt can also cause your body to keep too much fluid, making your kidneys work harder. You may need to limit or avoid foods that are high in salt, such as: Salt seasonings. Soy and teriyaki sauce. Packaged, precooked, cured, or processed meats, such as sausages or meat loaves. Sardines. Salted crackers and snack foods. Fast food. Canned soups and most canned foods. Pickled foods. Vegetable juice. Boxed mixes or ready-to-eat boxed meals and side dishes. Bottled dressings, sauces, and marinades. Talk with your dietitian about how much potassium, phosphorus, protein, and salt you may have each day. Helpful tips Read food labels  Check the amount of salt in foods. Limit foods that have salt or sodium listed among the first five ingredients. Try to eat low-salt foods. Check the ingredient list   for added phosphorus or potassium. "Phos" in an ingredient is a sign that phosphorus has been added. Do not buy foods that are calcium-enriched or that have calcium added to them (are fortified). Buy canned vegetables and beans that say "no salt added" and rinse them before  eating. Lifestyle Limit the amount of protein you eat from animal sources each day. Focus on protein from plant sources, like tofu and dried beans, peas, and lentils. Do not add salt to food when cooking or before eating. Do not eat star fruit. It can be toxic for people with kidney problems. Talk with your health care provider before taking any vitamin or mineral supplements. If told by your health care provider, track how much liquid you drink so you can avoid drinking too much. You may need to include foods you eat that are made mostly from water, like gelatin, ice cream, soups, and juicy fruits and vegetables. If you have diabetes: If you have diabetes (diabetes mellitus) and CKD, you need to keep your blood sugar (glucose) in the target range recommended by your health care provider. Follow your diabetes management plan. This may include: Checking your blood glucose regularly. Taking medicines by mouth, or taking insulin, or both. Exercising for at least 30 minutes on 5 or more days each week, or as told by your health care provider. Tracking how many servings of carbohydrates you eat at each meal. Not using orange juice to treat low blood sugars. Instead, use apple juice, cranberry juice, or clear soda. You may be given guidelines on what foods and nutrients you may eat, and how much you can have each day. This depends on your stage of kidney disease and whether you have high blood pressure (hypertension). Follow the meal plan your dietitian gives you. To learn more: National Institute of Diabetes and Digestive and Kidney Diseases: niddk.nih.gov National Kidney Foundation: kidney.org Summary Chronic kidney disease (CKD) is when your kidneys are not working well. They cannot remove waste, fluids, and other substances from your blood the way they should. These substances can build up, which can worsen kidney damage and affect how your body works. Changing your diet can help prevent more  kidney damage. Diet changes may also delay dialysis or even keep you from needing it. Diet changes are different for each person with CKD. Work with a dietitian to set up a meal plan that is right for you. This information is not intended to replace advice given to you by your health care provider. Make sure you discuss any questions you have with your health care provider. Document Revised: 02/26/2022 Document Reviewed: 03/03/2020 Elsevier Patient Education  2023 Elsevier Inc.  

## 2022-10-20 ENCOUNTER — Encounter: Payer: Medicare Other | Admitting: Nurse Practitioner

## 2022-10-20 VITALS — Ht 68.11 in

## 2022-10-20 NOTE — Progress Notes (Signed)
Error, not seen

## 2022-12-10 ENCOUNTER — Other Ambulatory Visit: Payer: Self-pay

## 2022-12-10 MED ORDER — FENOFIBRATE 145 MG PO TABS: 145.0000 mg | ORAL_TABLET | Freq: Every day | ORAL | 4 refills | Status: DC

## 2022-12-10 NOTE — Telephone Encounter (Signed)
Medication refill for Fenofibrate '145mg'$  last ov 11/25/22, upcoming ov 12/24/22 . Please advise

## 2022-12-24 ENCOUNTER — Ambulatory Visit: Payer: Medicare Other | Admitting: Nurse Practitioner

## 2022-12-25 NOTE — Patient Instructions (Incomplete)
Please call to schedule bone density: [facility] at [place]: [address], [place], [place] [address] Phone: [phone]  [place] [facility] [address] [place],  [place]  [address] Phone: [phone]    Food Basics for Chronic Kidney Disease Chronic kidney disease (CKD) is when your kidneys are not working well. They cannot remove waste, fluids, and other substances from your blood the way they should. These substances can build up, which can worsen kidney damage and affect how your body works. Eating certain foods can lead to a buildup of these substances. Changing your diet can help prevent more kidney damage. Diet changes may also delay dialysis or even keep you from needing it. What nutrients should I limit? Work with your treatment team and a food expert (dietitian) to make a meal plan that's right for you. Foods you can eat and foods you should limit or avoid will depend on the stage of your kidney disease and any other health conditions you have. The items listed below are not a complete list. Talk with your dietitian to learn what is best for you. Potassium Potassium affects how steadily your heart beats. Too much potassium in your blood can cause an irregular heartbeat or even a heart attack. You may need to limit foods that are high in potassium, such as: Liquid milk and soy milk. Salt substitutes that contain potassium. Fruits like bananas, apricots, nectarines, melon, prunes, raisins, kiwi, and oranges. Vegetables, such as potatoes, sweet potatoes, yams, tomatoes, leafy greens, beets, avocado, pumpkin, and winter squash. Beans, like lima beans. Nuts. Phosphorus Phosphorus is a mineral found in your bones. You need a balance between calcium and phosphorus to build and maintain healthy bones. Too much added phosphorus from the foods you eat can pull calcium from your bones. Losing calcium can make your  bones weak and more likely to break. Too much phosphorus can also make your skin itch. You may need to limit foods that are high in phosphorus or that have added phosphorus, such as: Liquid milk and dairy products. Dark-colored sodas or soft drinks. Bran cereals and oatmeal. Protein  Protein helps you make and keep muscle. Protein also helps to repair your body's cells and tissues. One of the natural breakdown products of protein is a waste product called urea. When your kidneys are not working well, they cannot remove types of waste like urea. Reducing protein in your diet can help keep urea from building up in your blood. Depending on your stage of kidney disease, you may need to eat smaller portions of foods that are high in protein. Sources of animal protein include: Meat (all types). Fish and seafood. Poultry. Eggs. Dairy. Other protein foods include: Beans and legumes. Nuts and nut butter. Soy, like tofu.  Sodium Salt (sodium) helps to keep a healthy balance of fluids in your body. Too much salt can increase your blood pressure, which can harm your heart and lungs. Extra salt can also cause your body to keep too much fluid, making your kidneys work harder. You may need to limit or avoid foods that are high in salt, such as: Salt seasonings. Soy and teriyaki sauce. Packaged, precooked, cured, or processed meats, such as sausages or meat loaves. Sardines. Salted crackers and snack foods. Fast food. Canned soups and most canned foods. Pickled foods. Vegetable juice. Boxed mixes or ready-to-eat boxed meals and side dishes. Bottled dressings, sauces, and marinades. Talk with your dietitian  about how much potassium, phosphorus, protein, and salt you may have each day. Helpful tips Read food labels  Check the amount of salt in foods. Limit foods that have salt or sodium listed among the first five ingredients. Try to eat low-salt foods. Check the ingredient list for added  phosphorus or potassium. "Phos" in an ingredient is a sign that phosphorus has been added. Do not buy foods that are calcium-enriched or that have calcium added to them (are fortified). Buy canned vegetables and beans that say "no salt added" and rinse them before eating. Lifestyle Limit the amount of protein you eat from animal sources each day. Focus on protein from plant sources, like tofu and dried beans, peas, and lentils. Do not add salt to food when cooking or before eating. Do not eat star fruit. It can be toxic for people with kidney problems. Talk with your health care provider before taking any vitamin or mineral supplements. If told by your health care provider, track how much liquid you drink so you can avoid drinking too much. You may need to include foods you eat that are made mostly from water, like gelatin, ice cream, soups, and juicy fruits and vegetables. If you have diabetes: If you have diabetes (diabetes mellitus) and CKD, you need to keep your blood sugar (glucose) in the target range recommended by your health care provider. Follow your diabetes management plan. This may include: Checking your blood glucose regularly. Taking medicines by mouth, or taking insulin, or both. Exercising for at least 30 minutes on 5 or more days each week, or as told by your health care provider. Tracking how many servings of carbohydrates you eat at each meal. Not using orange juice to treat low blood sugars. Instead, use apple juice, cranberry juice, or clear soda. You may be given guidelines on what foods and nutrients you may eat, and how much you can have each day. This depends on your stage of kidney disease and whether you have high blood pressure (hypertension). Follow the meal plan your dietitian gives you. To learn more: [employer] of Diabetes and Digestive and Kidney Diseases: [employer]: kidney.org Summary Chronic kidney disease (CKD) is when  your kidneys are not working well. They cannot remove waste, fluids, and other substances from your blood the way they should. These substances can build up, which can worsen kidney damage and affect how your body works. Changing your diet can help prevent more kidney damage. Diet changes may also delay dialysis or even keep you from needing it. Diet changes are different for each person with CKD. Work with a dietitian to set up a meal plan that is right for you. This information is not intended to replace advice given to you by your health care provider. Make sure you discuss any questions you have with your health care provider. Document Revised: 02/26/2022 Document Reviewed: 03/03/2020 Elsevier Patient Education  [place].

## 2022-12-28 ENCOUNTER — Encounter: Payer: Self-pay | Admitting: Nurse Practitioner

## 2022-12-28 ENCOUNTER — Ambulatory Visit (INDEPENDENT_AMBULATORY_CARE_PROVIDER_SITE_OTHER): Payer: Medicare Other | Admitting: Nurse Practitioner

## 2022-12-28 VITALS — BP 124/73 | HR 58 | Temp 97.5°F | Ht 68.11 in | Wt 170.4 lb

## 2022-12-28 DIAGNOSIS — N1832 Chronic kidney disease, stage 3b: Secondary | ICD-10-CM | POA: Diagnosis not present

## 2022-12-28 DIAGNOSIS — E785 Hyperlipidemia, unspecified: Secondary | ICD-10-CM

## 2022-12-28 DIAGNOSIS — D692 Other nonthrombocytopenic purpura: Secondary | ICD-10-CM

## 2022-12-28 DIAGNOSIS — M810 Age-related osteoporosis without current pathological fracture: Secondary | ICD-10-CM

## 2022-12-28 DIAGNOSIS — E039 Hypothyroidism, unspecified: Secondary | ICD-10-CM

## 2022-12-28 DIAGNOSIS — E892 Postprocedural hypoparathyroidism: Secondary | ICD-10-CM

## 2022-12-28 DIAGNOSIS — K219 Gastro-esophageal reflux disease without esophagitis: Secondary | ICD-10-CM

## 2022-12-28 MED ORDER — FENOFIBRATE 145 MG PO TABS: 145.0000 mg | ORAL_TABLET | Freq: Every day | ORAL | 4 refills | Status: DC

## 2022-12-28 MED ORDER — FEXOFENADINE HCL 180 MG PO TABS: 180.0000 mg | ORAL_TABLET | ORAL | 4 refills | Status: AC | PRN

## 2022-12-28 NOTE — Assessment & Plan Note (Signed)
Chronic, ongoing.  Will continue collaboration with nephrology, recent notes and labs reviewed.  Appreciate their input.

## 2022-12-28 NOTE — Assessment & Plan Note (Signed)
Has not seen endocrinology in some time, discussed with this wife and him.  Will order repeat DEXA as recommended and restart medication as needed.  May need to return to endo in future if IV treatment needed.  Could also consider Prolia.  Vit D on labs today.

## 2022-12-28 NOTE — Assessment & Plan Note (Signed)
Ongoing.  Continues on daily ASA. Recommend use of gentle skin cleansers at home and monitor for wounds, if present notify provider.

## 2022-12-28 NOTE — Progress Notes (Signed)
BP 124/73   Pulse (!) 58   Temp (!) 97.5 F (36.4 C) (Oral)   Ht 5' 8.11" (1.73 m)   Wt 170 lb 6.4 oz (77.3 kg)   SpO2 96%   BMI 25.83 kg/m    Subjective:    Patient ID: Vincent Koch, male    DOB: 01-Mar-1934, 89 y.o.   MRN: [id number]  HPI: Vincent Koch is a 89 y.o. male  Chief Complaint  Patient presents with   Chronic Kidney Disease   HYPOTHYROIDISM Continues on Levothyroxine 25 MCG.  Continues on Fenofibrate for triglycerides.  Followed with endo in past for osteoporosis, history of parathyroidectomy in 2013.  Has not had a DEXA in some time. Thyroid control status:stable Satisfied with current treatment? yes Medication side effects: no Medication compliance: good compliance Etiology of hypothyroidism: unknown Recent dose adjustment:no Fatigue: no Cold intolerance: no Heat intolerance: no Weight gain: no Weight loss: no Constipation: no Diarrhea/loose stools: no Palpitations: no Lower extremity edema: no Anxiety/depressed mood: no  The ASCVD Risk score (Arnett DK, et al., 2019) failed to calculate for the following reasons:   The 2019 ASCVD risk score is only valid for ages 48 to 97   CHRONIC KIDNEY DISEASE Saw nephrology on 11/25/22 with labs noting CRT 1.90, eGFR 34, PTH 26.   CKD status: stable Medications renally dose: yes Previous renal evaluation: yes Pneumovax:  Up to Date Influenza Vaccine:  Up to Date   GERD No longer taking Protonix -- never takes this anymore. GERD control status: stable Satisfied with current treatment? yes Heartburn frequency: none Medication side effects: no  Medication compliance: stable Dysphagia: no Odynophagia:  no Hematemesis: no Blood in stool: no EGD: yes  Relevant past medical, surgical, family and social history reviewed and updated as indicated. Interim medical history since our last visit reviewed. Allergies and medications reviewed and updated.  Review of Systems  Constitutional:  Negative  for activity change, diaphoresis, fatigue and fever.  Respiratory:  Negative for cough, chest tightness, shortness of breath and wheezing.   Cardiovascular:  Negative for chest pain, palpitations and leg swelling.  Gastrointestinal: Negative.   Endocrine: Negative for cold intolerance and heat intolerance.  Neurological: Negative.   Psychiatric/Behavioral: Negative.      Per HPI unless specifically indicated above     Objective:    BP 124/73   Pulse (!) 58   Temp (!) 97.5 F (36.4 C) (Oral)   Ht 5' 8.11" (1.73 m)   Wt 170 lb 6.4 oz (77.3 kg)   SpO2 96%   BMI 25.83 kg/m   Wt Readings from Last 3 Encounters:  12/28/22 170 lb 6.4 oz (77.3 kg)  06/22/22 160 lb 3.2 oz (72.7 kg)  04/29/22 159 lb 2 oz (72.2 kg)    Physical Exam Vitals and nursing note reviewed.  Constitutional:      General: He is awake. He is not in acute distress.    Appearance: He is well-developed and well-groomed. He is not ill-appearing or toxic-appearing.  HENT:     Head: Normocephalic.     Right Ear: Hearing and external ear normal.     Left Ear: Hearing and external ear normal.  Eyes:     General: Lids are normal.     Extraocular Movements: Extraocular movements intact.     Conjunctiva/sclera: Conjunctivae normal.  Neck:     Thyroid: No thyromegaly.     Vascular: No carotid bruit.  Cardiovascular:     Rate and Rhythm: Normal  rate and regular rhythm.     Heart sounds: Normal heart sounds.  Pulmonary:     Effort: No accessory muscle usage or respiratory distress.     Breath sounds: Normal breath sounds.  Abdominal:     General: Bowel sounds are normal. There is no distension.     Palpations: Abdomen is soft.     Tenderness: There is no abdominal tenderness.  Musculoskeletal:     Cervical back: Full passive range of motion without pain.     Right lower leg: No edema.     Left lower leg: No edema.  Lymphadenopathy:     Cervical: No cervical adenopathy.  Skin:    General: Skin is warm.      Capillary Refill: Capillary refill takes less than 2 seconds.     Findings: Bruising present.     Comments: Scattered bruising to upper extremities.  Neurological:     Mental Status: He is alert and oriented to person, place, and time.     Deep Tendon Reflexes: Reflexes are normal and symmetric.     Reflex Scores:      Brachioradialis reflexes are 2+ on the right side and 2+ on the left side.      Patellar reflexes are 2+ on the right side and 2+ on the left side. Psychiatric:        Attention and Perception: Attention normal.        Mood and Affect: Mood normal.        Speech: Speech normal.        Behavior: Behavior normal. Behavior is cooperative.        Thought Content: Thought content normal.     Results for orders placed or performed in visit on 06/23/22  T4, free  Result Value Ref Range   Free T4 0.87 0.82 - 1.77 ng/dL  TSH  Result Value Ref Range   TSH 4.260 0.450 - 4.500 uIU/mL  Comprehensive metabolic panel  Result Value Ref Range   Glucose 119 (H) 70 - 99 mg/dL   BUN 22 8 - 27 mg/dL   Creatinine, Ser 1.57 (H) 0.76 - 1.27 mg/dL   eGFR 43 (L) >59 mL/min/1.73   BUN/Creatinine Ratio 14 10 - 24   Sodium 140 134 - 144 mmol/L   Potassium 4.5 3.5 - 5.2 mmol/L   Chloride 104 96 - 106 mmol/L   CO2 20 20 - 29 mmol/L   Calcium 9.3 8.6 - 10.2 mg/dL   Total Protein 6.8 6.0 - 8.5 g/dL   Albumin 4.3 3.7 - 4.7 g/dL   Globulin, Total 2.5 1.5 - 4.5 g/dL   Albumin/Globulin Ratio 1.7 1.2 - 2.2   Bilirubin Total 0.5 0.0 - 1.2 mg/dL   Alkaline Phosphatase 59 44 - 121 IU/L   AST 36 0 - 40 IU/L   ALT 25 0 - 44 IU/L      Assessment & Plan:   Problem List Items Addressed This Visit       Cardiovascular and Mediastinum   Purpura senilis ([place])    Ongoing.  Continues on daily ASA. Recommend use of gentle skin cleansers at home and monitor for wounds, if present notify provider.      Relevant Medications   fenofibrate (TRICOR) 145 MG tablet     Digestive   Gastroesophageal  reflux disease without esophagitis    Chronic, stable without medication.  Continue without at this time and restart as needed.        Endocrine  Hypothyroidism    Chronic, ongoing.  Continue current medication regimen and adjust as needed based on labs.  Free T4 and TSH today + antibody.      Relevant Orders   T4, free   TSH   Thyroid peroxidase antibody     Musculoskeletal and Integument   Senile osteoporosis    Has not seen endocrinology in some time, discussed with this wife and him.  Will order repeat DEXA as recommended and restart medication as needed.  May need to return to endo in future if IV treatment needed.  Could also consider Prolia.  Vit D on labs today.      Relevant Orders   VITAMIN D 25 Hydroxy (Vit-D Deficiency, Fractures)   DG Bone Density     Genitourinary   CKD (chronic kidney disease) stage 3, GFR 30-59 ml/min (HCC) - Primary    Chronic, ongoing.  Will continue collaboration with nephrology, recent notes and labs reviewed.  Appreciate their input.        Other   Dyslipidemia    Chronic, ongoing.  Continue current medication regimen and adjust as needed.  Lipid panel today.      Relevant Medications   fenofibrate (TRICOR) 145 MG tablet   Other Relevant Orders   Lipid Panel w/o Chol/HDL Ratio   History of parathyroidectomy ([place])    Saw endo in past, will return as needed.      Relevant Orders   VITAMIN D 25 Hydroxy (Vit-D Deficiency, Fractures)   DG Bone Density     Follow up plan: Return in about 6 months (around 06/28/2023) for OSTEOPOROSIS, THYROID, HLD, CKD.

## 2022-12-28 NOTE — Assessment & Plan Note (Signed)
Saw endo in past, will return as needed.

## 2022-12-28 NOTE — Assessment & Plan Note (Signed)
Chronic, stable without medication.  Continue without at this time and restart as needed.

## 2022-12-28 NOTE — Assessment & Plan Note (Signed)
Chronic, ongoing. Continue current medication regimen and adjust as needed.  Lipid panel today. 

## 2022-12-28 NOTE — Assessment & Plan Note (Signed)
Chronic, ongoing.  Continue current medication regimen and adjust as needed based on labs.  Free T4 and TSH today + antibody.

## 2022-12-29 LAB — TSH: TSH: 3.04 u[IU]/mL (ref 0.450–4.500)

## 2022-12-29 LAB — VITAMIN D 25 HYDROXY (VIT D DEFICIENCY, FRACTURES): Vit D, 25-Hydroxy: 28 ng/mL — ABNORMAL LOW (ref 30.0–100.0)

## 2022-12-29 LAB — T4, FREE: Free T4: 0.83 ng/dL (ref 0.82–1.77)

## 2022-12-29 LAB — LIPID PANEL W/O CHOL/HDL RATIO
Cholesterol, Total: 215 mg/dL — ABNORMAL HIGH (ref 100–199)
HDL: 36 mg/dL — ABNORMAL LOW (ref >39)
LDL Chol Calc (NIH): 116 mg/dL — ABNORMAL HIGH (ref 0–99)
Triglycerides: 362 mg/dL — ABNORMAL HIGH (ref 0–149)
VLDL Cholesterol Cal: 63 mg/dL — ABNORMAL HIGH (ref 5–40)

## 2022-12-29 LAB — MAGNESIUM: Magnesium: 2.1 mg/dL (ref 1.6–2.3)

## 2022-12-29 LAB — THYROID PEROXIDASE ANTIBODY: Thyroperoxidase Ab SerPl-aCnc: <9 IU/mL (ref 0–34)

## 2022-12-29 NOTE — Progress Notes (Signed)
Contacted via MyChart   Good evening Vincent Koch, your labs have returned: - Cholesterol labs remain elevated -- continue Fenofibrate, restart.  We will recheck next visit. - Vitamin D level is a little low, I do recommend taking Vitamin D3 2000 units daily for overall bone health. - Remainder of labs stable, no other medication changes needed.  Any questions? Keep being amazing!!  Thank you for allowing me to participate in your care.  I appreciate you. Kindest regards, Zuzanna Maroney

## 2022-12-30 ENCOUNTER — Other Ambulatory Visit: Payer: Self-pay | Admitting: Nurse Practitioner

## 2022-12-30 NOTE — Telephone Encounter (Signed)
Requested Prescriptions  Pending Prescriptions Disp Refills   levothyroxine (SYNTHROID) 25 MCG tablet [Pharmacy Med Name: Levothyroxine Sodium 25 MCG Oral Tablet] 90 tablet 1    Sig: Take 1 tablet by mouth once daily     Endocrinology:  Hypothyroid Agents Passed - 12/30/2022 12:20 PM      Passed - TSH in normal range and within 360 days    TSH  Date Value Ref Range Status  12/27/2022 3.040 0.450 - 4.500 uIU/mL Final         Passed - Valid encounter within last 12 months    Recent Outpatient Visits           2 days ago Stage 3b chronic kidney disease ([place])   [place], Henrine Screws T, NP   6 months ago Dyslipidemia   [place] Kathrine Haddock, NP   7 months ago Left foot pain   [place] Vigg, Avanti, MD   8 months ago [place] Vigg, Avanti, MD   9 months ago Clostridium difficile diarrhea   [place] Vigg, Avanti, MD       Future Appointments             In 6 months Cannady, Barbaraann Faster, NP [place], [facility]

## 2023-02-15 ENCOUNTER — Telehealth: Payer: Self-pay | Admitting: Nurse Practitioner

## 2023-02-15 NOTE — Telephone Encounter (Signed)
Contacted Vincent Koch to schedule their annual wellness visit. Appointment made for 03/14/2023.  Sherol Dade; Care Guide Ambulatory [facility] Direct Dial: [phone]

## 2023-03-14 ENCOUNTER — Ambulatory Visit (INDEPENDENT_AMBULATORY_CARE_PROVIDER_SITE_OTHER): Payer: Medicare Other

## 2023-03-14 VITALS — Ht 68.0 in | Wt 170.0 lb

## 2023-03-14 DIAGNOSIS — Z0000 Encounter for general adult medical examination without abnormal findings: Secondary | ICD-10-CM | POA: Diagnosis not present

## 2023-03-14 NOTE — Patient Instructions (Signed)
Vincent Koch , Thank you for taking time to come for your Medicare Wellness Visit. I appreciate your ongoing commitment to your health goals. Please review the following plan we discussed and let me know if I can assist you in the future.   These are the goals we discussed:  Goals      DIET - EAT MORE FRUITS AND VEGETABLES     DIET - INCREASE WATER INTAKE     Recommend drinking at least 6-8 glasses of water a day      Increase water intake     Recommend to continue drinking at least 5-6 glasses of water a day     Patient Stated     03/02/2021, stay healthy     Patient Stated     No goals at this time        This is a list of the screening recommended for you and due dates:  Health Maintenance  Topic Date Due   DEXA scan (bone density measurement)  Never done   COVID-19 Vaccine (1) Never done   Zoster (Shingles) Vaccine (1 of 2) Never done   Flu Shot  06/23/2023   Colon Cancer Screening  12/12/2023   Medicare Annual Wellness Visit  03/13/2024   DTaP/Tdap/Td vaccine (2 - Td or Tdap) 06/29/2026   HPV Vaccine  Aged Out   Pneumonia Vaccine  Discontinued    Advanced directives: no  Conditions/risks identified: none  Next appointment: Follow up in one year for your annual wellness visit. 03/19/24 @ 2:00 pm by phone  Preventive Care 65 Years and Older, Male  Preventive care refers to lifestyle choices and visits with your health care provider that can promote health and wellness. What does preventive care include? A yearly physical exam. This is also called an annual well check. Dental exams once or twice a year. Routine eye exams. Ask your health care provider how often you should have your eyes checked. Personal lifestyle choices, including: Daily care of your teeth and gums. Regular physical activity. Eating a healthy diet. Avoiding tobacco and drug use. Limiting alcohol use. Practicing safe sex. Taking low doses of aspirin every day. Taking vitamin and mineral  supplements as recommended by your health care provider. What happens during an annual well check? The services and screenings done by your health care provider during your annual well check will depend on your age, overall health, lifestyle risk factors, and family history of disease. Counseling  Your health care provider may ask you questions about your: Alcohol use. Tobacco use. Drug use. Emotional well-being. Home and relationship well-being. Sexual activity. Eating habits. History of falls. Memory and ability to understand (cognition). Work and work Astronomer. Screening  You may have the following tests or measurements: Height, weight, and BMI. Blood pressure. Lipid and cholesterol levels. These may be checked every 5 years, or more frequently if you are over 8 years old. Skin check. Lung cancer screening. You may have this screening every year starting at age 51 if you have a 30-pack-year history of smoking and currently smoke or have quit within the past 15 years. Fecal occult blood test (FOBT) of the stool. You may have this test every year starting at age 20. Flexible sigmoidoscopy or colonoscopy. You may have a sigmoidoscopy every 5 years or a colonoscopy every 10 years starting at age 10. Prostate cancer screening. Recommendations will vary depending on your family history and other risks. Hepatitis C blood test. Hepatitis B blood test. Sexually transmitted disease (  STD) testing. Diabetes screening. This is done by checking your blood sugar (glucose) after you have not eaten for a while (fasting). You may have this done every 1-3 years. Abdominal aortic aneurysm (AAA) screening. You may need this if you are a current or former smoker. Osteoporosis. You may be screened starting at age 59 if you are at high risk. Talk with your health care provider about your test results, treatment options, and if necessary, the need for more tests. Vaccines  Your health care provider  may recommend certain vaccines, such as: Influenza vaccine. This is recommended every year. Tetanus, diphtheria, and acellular pertussis (Tdap, Td) vaccine. You may need a Td booster every 10 years. Zoster vaccine. You may need this after age 68. Pneumococcal 13-valent conjugate (PCV13) vaccine. One dose is recommended after age 78. Pneumococcal polysaccharide (PPSV23) vaccine. One dose is recommended after age 78. Talk to your health care provider about which screenings and vaccines you need and how often you need them. This information is not intended to replace advice given to you by your health care provider. Make sure you discuss any questions you have with your health care provider. Document Released: 12/05/2015 Document Revised: 07/28/2016 Document Reviewed: 09/09/2015 Elsevier Interactive Patient Education  2017 [employer].  Fall Prevention in the Home Falls can cause injuries. They can happen to people of all ages. There are many things you can do to make your home safe and to help prevent falls. What can I do on the outside of my home? Regularly fix the edges of walkways and driveways and fix any cracks. Remove anything that might make you trip as you walk through a door, such as a raised step or threshold. Trim any bushes or trees on the path to your home. Use bright outdoor lighting. Clear any walking paths of anything that might make someone trip, such as rocks or tools. Regularly check to see if handrails are loose or broken. Make sure that both sides of any steps have handrails. Any raised decks and porches should have guardrails on the edges. Have any leaves, snow, or ice cleared regularly. Use sand or salt on walking paths during winter. Clean up any spills in your garage right away. This includes oil or grease spills. What can I do in the bathroom? Use night lights. Install grab bars by the toilet and in the tub and shower. Do not use towel bars as grab bars. Use  non-skid mats or decals in the tub or shower. If you need to sit down in the shower, use a plastic, non-slip stool. Keep the floor dry. Clean up any water that spills on the floor as soon as it happens. Remove soap buildup in the tub or shower regularly. Attach bath mats securely with double-sided non-slip rug tape. Do not have throw rugs and other things on the floor that can make you trip. What can I do in the bedroom? Use night lights. Make sure that you have a light by your bed that is easy to reach. Do not use any sheets or blankets that are too big for your bed. They should not hang down onto the floor. Have a firm chair that has side arms. You can use this for support while you get dressed. Do not have throw rugs and other things on the floor that can make you trip. What can I do in the kitchen? Clean up any spills right away. Avoid walking on wet floors. Keep items that you use a lot  in easy-to-reach places. If you need to reach something above you, use a strong step stool that has a grab bar. Keep electrical cords out of the way. Do not use floor polish or wax that makes floors slippery. If you must use wax, use non-skid floor wax. Do not have throw rugs and other things on the floor that can make you trip. What can I do with my stairs? Do not leave any items on the stairs. Make sure that there are handrails on both sides of the stairs and use them. Fix handrails that are broken or loose. Make sure that handrails are as long as the stairways. Check any carpeting to make sure that it is firmly attached to the stairs. Fix any carpet that is loose or worn. Avoid having throw rugs at the top or bottom of the stairs. If you do have throw rugs, attach them to the floor with carpet tape. Make sure that you have a light switch at the top of the stairs and the bottom of the stairs. If you do not have them, ask someone to add them for you. What else can I do to help prevent falls? Wear  shoes that: Do not have high heels. Have rubber bottoms. Are comfortable and fit you well. Are closed at the toe. Do not wear sandals. If you use a stepladder: Make sure that it is fully opened. Do not climb a closed stepladder. Make sure that both sides of the stepladder are locked into place. Ask someone to hold it for you, if possible. Clearly mark and make sure that you can see: Any grab bars or handrails. First and last steps. Where the edge of each step is. Use tools that help you move around (mobility aids) if they are needed. These include: Canes. Walkers. Scooters. Crutches. Turn on the lights when you go into a dark area. Replace any light bulbs as soon as they burn out. Set up your furniture so you have a clear path. Avoid moving your furniture around. If any of your floors are uneven, fix them. If there are any pets around you, be aware of where they are. Review your medicines with your doctor. Some medicines can make you feel dizzy. This can increase your chance of falling. Ask your doctor what other things that you can do to help prevent falls. This information is not intended to replace advice given to you by your health care provider. Make sure you discuss any questions you have with your health care provider. Document Released: 09/04/2009 Document Revised: 04/15/2016 Document Reviewed: 12/13/2014 Elsevier Interactive Patient Education  2017 [employer].

## 2023-03-14 NOTE — Progress Notes (Signed)
I connected with  Kathi Ludwig on 03/14/23 by a audio enabled telemedicine application and verified that I am speaking with the correct person using two identifiers.  Patient Location: Home  Provider Location: Office/Clinic  I discussed the limitations of evaluation and management by telemedicine. The patient expressed understanding and agreed to proceed.  Subjective:   NIVEK POWLEY is a 89 y.o. male who presents for Medicare Annual/Subsequent preventive examination.  Review of Systems     Cardiac Risk Factors include: advanced age (>72men, >24 women);hypertension;male gender     Objective:    There were no vitals filed for this visit. There is no height or weight on file to calculate BMI.     03/14/2023    3:35 PM 03/10/2022   12:31 PM 02/22/2022    1:49 PM 02/21/2022   10:34 PM 05/26/2021    3:20 PM 03/02/2021    9:03 AM 06/27/2019   10:55 AM  Advanced Directives  Does Patient Have a Medical Advance Directive? No Yes Yes Yes Yes Yes Yes  Type of Advance Directive  Living will Healthcare Power of Attorney Living will Healthcare Power of [place];Living will Healthcare Power of [place];Living will Living will  Does patient want to make changes to medical advance directive?   No - Patient declined    No - Patient declined  Copy of Healthcare Power of Attorney in Chart?  No - copy requested No - copy requested  No - copy requested No - copy requested Yes - validated most recent copy scanned in chart (See row information)  Would patient like information on creating a medical advance directive? No - Patient declined No - Patient declined  No - Patient declined       Current Medications (verified) Outpatient Encounter Medications as of 03/14/2023  Medication Sig   acetaminophen (TYLENOL) 500 MG tablet Take 500 mg by mouth every 6 (six) hours as needed.   aspirin EC 81 MG tablet Take 81 mg by mouth once.    calcium-vitamin D (OSCAL WITH D) 500-200 MG-UNIT tablet Take 1 tablet  by mouth.   carbamide peroxide (DEBROX) 6.5 % OTIC solution Place 5 drops into the right ear 2 (two) times daily.   colchicine 0.6 MG tablet Take 1 tablet (0.6 mg total) by mouth daily.   fenofibrate (TRICOR) 145 MG tablet Take 1 tablet (145 mg total) by mouth daily.   fexofenadine (ALLEGRA) 180 MG tablet Take 1 tablet (180 mg total) by mouth as needed for allergies.   levothyroxine (SYNTHROID) 25 MCG tablet Take 1 tablet by mouth once daily   Multiple Vitamin (MULTI-VITAMINS) TABS Take by mouth daily.    No facility-administered encounter medications on file as of 03/14/2023.    Allergies (verified) Patient has no known allergies.   History: Past Medical History:  Diagnosis Date   Bradycardia    Chronic kidney disease    Clostridium difficile diarrhea 03/30/2022   GERD (gastroesophageal reflux disease)    Hyperlipemia    Hypertension    Neuropathy    toes   Osteoporosis    Peripheral neuropathy    Thyroid disease    Past Surgical History:  Procedure Laterality Date   CATARACT EXTRACTION W/PHACO Right 06/27/2019   Procedure: CATARACT EXTRACTION PHACO AND INTRAOCULAR LENS PLACEMENT (IOC)  RIGHT;  Surgeon: Lockie Mola, MD;  Location: [facility];  Service: Ophthalmology;  Laterality: Right;  VISION BLUE   COLONOSCOPY WITH PROPOFOL N/A 12/11/2018   Procedure: COLONOSCOPY WITH PROPOFOL;  Surgeon: Maximino Greenland,  Dolphus Jenny, MD;  Location: [facility];  Service: Endoscopy;  Laterality: N/A;   ESOPHAGEAL DILATION N/A 06/14/2016   Procedure: ESOPHAGOGASTRODUODENOSCOPY (EGD) WITH PROPOFOL with dilation;  Surgeon: Midge Minium, MD;  Location: [facility];  Service: Endoscopy;  Laterality: N/A;   ESOPHAGOGASTRODUODENOSCOPY (EGD) WITH PROPOFOL N/A 07/15/2016   Procedure: ESOPHAGOGASTRODUODENOSCOPY (EGD) WITH DILATION;  Surgeon: Midge Minium, MD;  Location: [facility];  Service: Endoscopy;  Laterality: N/A;   ESOPHAGOGASTRODUODENOSCOPY (EGD) WITH PROPOFOL N/A 12/11/2018    Procedure: ESOPHAGOGASTRODUODENOSCOPY (EGD) WITH PROPOFOL;  Surgeon: Pasty Spillers, MD;  Location: [facility];  Service: Endoscopy;  Laterality: N/A;   HERNIA REPAIR     PARATHYROIDECTOMY     Family History  Problem Relation Age of Onset   Stroke Mother    Throat cancer Father    Social History   Socioeconomic History   Marital status: Married    Spouse name: Not on file   Number of children: Not on file   Years of education: Not on file   Highest education level: Master's degree (e.g., MA, MS, MEng, MEd, MSW, MBA)  Occupational History   Occupation: retired  Tobacco Use   Smoking status: Never   Smokeless tobacco: Never  Vaping Use   Vaping Use: Never used  Substance and Sexual Activity   Alcohol use: Never   Drug use: Never   Sexual activity: Yes  Other Topics Concern   Not on file  Social History Narrative   ** Merged History Encounter **       Social Determinants of Health   Financial Resource Strain: Low Risk  (03/14/2023)   Overall Financial Resource Strain (CARDIA)    Difficulty of Paying Living Expenses: Not hard at all  Food Insecurity: No Food Insecurity (03/14/2023)   Hunger Vital Sign    Worried About Running Out of Food in the Last Year: Never true    Ran Out of Food in the Last Year: Never true  Transportation Needs: No Transportation Needs (03/14/2023)   PRAPARE - Administrator, Civil Service (Medical): No    Lack of Transportation (Non-Medical): No  Physical Activity: Insufficiently Active (03/14/2023)   Exercise Vital Sign    Days of Exercise per Week: 3 days    Minutes of Exercise per Session: 30 min  Stress: No Stress Concern Present (03/14/2023)   [employer] of Occupational Health - Occupational Stress Questionnaire    Feeling of Stress : Not at all  Social Connections: Moderately Integrated (03/14/2023)   Social Connection and Isolation Panel [NHANES]    Frequency of Communication with Friends and Family: More  than three times a week    Frequency of Social Gatherings with Friends and Family: Never    Attends Religious Services: More than 4 times per year    Active Member of [employer] or Organizations: No    Attends Engineer, structural: Never    Marital Status: Married    Tobacco Counseling Counseling given: Not Answered   Clinical Intake:  Pre-visit preparation completed: Yes  Pain : No/denies pain     Nutritional Risks: None Diabetes: No  How often do you need to have someone help you when you read instructions, pamphlets, or other written materials from your doctor or pharmacy?: 1 - Never  Diabetic?no  Interpreter Needed?: No  Information entered by :: Kennedy Bucker, LPN   Activities of Daily Living    03/14/2023    3:36 PM  In your present state of health,  do you have any difficulty performing the following activities:  Hearing? 1  Vision? 0  Difficulty concentrating or making decisions? 0  Walking or climbing stairs? 0  Dressing or bathing? 0  Doing errands, shopping? 0  Preparing Food and eating ? N  Using the Toilet? N  In the past six months, have you accidently leaked urine? N  Do you have problems with loss of bowel control? N  Managing your Medications? N  Managing your Finances? N  Housekeeping or managing your Housekeeping? N    Patient Care Team: Marjie Skiff, NP as PCP - General (Nurse Practitioner) Midge Minium, MD as Consulting Physician (Gastroenterology) Loura Pardon, MD (Inactive) (Internal Medicine)  Indicate any recent Medical Services you may have received from other than [facility] providers in the past year (date may be approximate).     Assessment:   This is a routine wellness examination for Yakir.  Hearing/Vision screen Hearing Screening - Comments:: HOH, no aids Vision Screening - Comments:: Readers- Dr.Brasington  Dietary issues and exercise activities discussed: Current Exercise Habits: Home exercise routine, Type of  exercise: walking, Time (Minutes): 30, Frequency (Times/Week): 3, Weekly Exercise (Minutes/Week): 90   Goals Addressed             This Visit's Progress    DIET - EAT MORE FRUITS AND VEGETABLES         Depression Screen    03/14/2023    3:33 PM 06/22/2022    2:56 PM 04/20/2022    3:48 PM 03/30/2022    2:12 PM 03/10/2022   12:36 PM 03/08/2022    3:03 PM 02/18/2022    3:04 PM  PHQ 2/9 Scores  PHQ - 2 Score 0 0 0 0 0 0 0  PHQ- 9 Score 0 0 0 0 0 0 0    Fall Risk    03/14/2023    3:36 PM 06/22/2022    2:56 PM 04/20/2022    3:47 PM 03/30/2022    2:12 PM 03/08/2022    3:15 PM  Fall Risk   Falls in the past year? 0 0 0 0 0  Number falls in past yr: 0 0 0 0 0  Injury with Fall? 0 0 0 0 0  Risk for fall due to : No Fall Risks No Fall Risks No Fall Risks No Fall Risks No Fall Risks  Follow up Falls prevention discussed;Falls evaluation completed Falls evaluation completed Falls evaluation completed Falls evaluation completed Falls evaluation completed    FALL RISK PREVENTION PERTAINING TO THE HOME:  Any stairs in or around the home? No  If so, are there any without handrails? No  Home free of loose throw rugs in walkways, pet beds, electrical cords, etc? Yes  Adequate lighting in your home to reduce risk of falls? Yes   ASSISTIVE DEVICES UTILIZED TO PREVENT FALLS:  Life alert? No  Use of a cane, walker or w/c? No  Grab bars in the bathroom? No  Shower chair or bench in shower? Yes  Elevated toilet seat or a handicapped toilet? No    Cognitive Function:        03/14/2023    3:39 PM 03/10/2022   12:32 PM 03/02/2021    9:08 AM 08/20/2019    2:51 PM 06/07/2018    2:54 PM  6CIT Screen  What Year? 0 points 0 points 0 points 0 points 0 points  What month? 0 points 0 points 0 points 0 points 0 points  What time? 3  points 0 points 0 points 0 points 0 points  Count back from 20 0 points 0 points 0 points 0 points 0 points  Months in reverse 0 points 2 points 0 points 0 points 0 points   Repeat phrase 0 points 2 points 4 points 0 points 2 points  Total Score 3 points 4 points 4 points 0 points 2 points    Immunizations Immunization History  Administered Date(s) Administered   Fluad Quad(high Dose 65+) 08/20/2019, 10/29/2020, 08/25/2021, 10/01/2022   Influenza, High Dose Seasonal PF 09/12/2017, 08/30/2018   Influenza,inj,Quad PF,6+ Mos 09/22/2015   Pneumococcal Conjugate-13 10/10/2014   Pneumococcal-Unspecified 11/02/2004   Tdap 06/29/2016   Zoster, Live 01/30/2007    TDAP status: Up to date  Flu Vaccine status: Up to date  Pneumococcal vaccine status: Due, Education has been provided regarding the importance of this vaccine. Advised may receive this vaccine at local pharmacy or Health Dept. Aware to provide a copy of the vaccination record if obtained from local pharmacy or Health Dept. Verbalized acceptance and understanding.  Covid-19 vaccine status: Declined, Education has been provided regarding the importance of this vaccine but patient still declined. Advised may receive this vaccine at local pharmacy or Health Dept.or vaccine clinic. Aware to provide a copy of the vaccination record if obtained from local pharmacy or Health Dept. Verbalized acceptance and understanding.  Qualifies for Shingles Vaccine? Yes   Zostavax completed Yes   Shingrix Completed?: No.    Education has been provided regarding the importance of this vaccine. Patient has been advised to call insurance company to determine out of pocket expense if they have not yet received this vaccine. Advised may also receive vaccine at local pharmacy or Health Dept. Verbalized acceptance and understanding.  Screening Tests Health Maintenance  Topic Date Due   DEXA SCAN  Never done   COVID-19 Vaccine (1) Never done   Zoster Vaccines- Shingrix (1 of 2) Never done   INFLUENZA VACCINE  06/23/2023   COLONOSCOPY (Pts 45-68yrs Insurance coverage will need to be confirmed)  12/12/2023   Medicare Annual  Wellness (AWV)  03/13/2024   DTaP/Tdap/Td (2 - Td or Tdap) 06/29/2026   HPV VACCINES  Aged Out   Pneumonia Vaccine 54+ Years old  Discontinued    Health Maintenance  Health Maintenance Due  Topic Date Due   DEXA SCAN  Never done   COVID-19 Vaccine (1) Never done   Zoster Vaccines- Shingrix (1 of 2) Never done    Colorectal cancer screening: No longer required.   Lung Cancer Screening: (Low Dose CT Chest recommended if Age 36-80 years, 30 pack-year currently smoking OR have quit w/in 15years.) does not qualify.    Additional Screening:  Hepatitis C Screening: does not qualify; Completed no  Vision Screening: Recommended annual ophthalmology exams for early detection of glaucoma and other disorders of the eye. Is the patient up to date with their annual eye exam?  Yes  Who is the provider or what is the name of the office in which the patient attends annual eye exams? Dr.Brasington If pt is not established with a provider, would they like to be referred to a provider to establish care? No .   Dental Screening: Recommended annual dental exams for proper oral hygiene  Community Resource Referral / Chronic Care Management: CRR required this visit?  No   CCM required this visit?  No      Plan:     I have personally reviewed and noted the following in  the patient's chart:   Medical and social history Use of alcohol, tobacco or illicit drugs  Current medications and supplements including opioid prescriptions. Patient is not currently taking opioid prescriptions. Functional ability and status Nutritional status Physical activity Advanced directives List of other physicians Hospitalizations, surgeries, and ER visits in previous 12 months Vitals Screenings to include cognitive, depression, and falls Referrals and appointments  In addition, I have reviewed and discussed with patient certain preventive protocols, quality metrics, and best practice recommendations. A written  personalized care plan for preventive services as well as general preventive health recommendations were provided to patient.     Hal Hope, LPN   [id number]   Nurse Notes: none

## 2023-03-28 ENCOUNTER — Encounter: Payer: Self-pay | Admitting: Nurse Practitioner

## 2023-03-28 ENCOUNTER — Ambulatory Visit (INDEPENDENT_AMBULATORY_CARE_PROVIDER_SITE_OTHER): Payer: Medicare Other | Admitting: Nurse Practitioner

## 2023-03-28 VITALS — BP 131/83 | HR 75 | Temp 97.6°F | Ht 68.11 in | Wt 164.6 lb

## 2023-03-28 DIAGNOSIS — H6121 Impacted cerumen, right ear: Secondary | ICD-10-CM

## 2023-03-28 DIAGNOSIS — M810 Age-related osteoporosis without current pathological fracture: Secondary | ICD-10-CM

## 2023-03-28 NOTE — Progress Notes (Signed)
BP 131/83   Pulse 75   Temp 97.6 F (36.4 C) (Oral)   Ht 5' 8.11" (1.73 m)   Wt 164 lb 9.6 oz (74.7 kg)   SpO2 96%   BMI 24.95 kg/m    Subjective:    Patient ID: Vincent Koch, male    DOB: 11-Mar-1934, 89 y.o.   MRN: [id number]  HPI: Vincent Koch is a 89 y.o. male  Chief Complaint  Patient presents with   Hearing Problem    For the past 2 weeks.   HEARING LOSS To right ear, has hearing loss for over one week.  Last time he had ears irrigated was 04/26/2022. Can hear out of left ear well, but not right.  Denies pain or drainage.  Has also noticed more nose bleeds over past two weeks, is on ASA.  Does not use saline spray at home.   Duration: weeks Involved ear(s): right Fever: no Otorrhea: no Upper respiratory infection symptoms: no Pruritus: no Hearing loss: yes Water immersion no Using Q-tips: no not recently Recurrent otitis media: no Status: fluctuating Treatments attempted: none   Relevant past medical, surgical, family and social history reviewed and updated as indicated. Interim medical history since our last visit reviewed. Allergies and medications reviewed and updated.  Review of Systems  Constitutional:  Negative for activity change, appetite change, chills, diaphoresis, fatigue and fever.  HENT:  Positive for hearing loss. Negative for ear discharge and ear pain.   Respiratory: Negative.    Cardiovascular: Negative.   Neurological: Negative.   Psychiatric/Behavioral: Negative.      Per HPI unless specifically indicated above     Objective:    BP 131/83   Pulse 75   Temp 97.6 F (36.4 C) (Oral)   Ht 5' 8.11" (1.73 m)   Wt 164 lb 9.6 oz (74.7 kg)   SpO2 96%   BMI 24.95 kg/m   Wt Readings from Last 3 Encounters:  03/28/23 164 lb 9.6 oz (74.7 kg)  03/14/23 170 lb (77.1 kg)  12/28/22 170 lb 6.4 oz (77.3 kg)    Physical Exam Vitals and nursing note reviewed.  Constitutional:      General: He is awake. He is not in acute  distress.    Appearance: He is well-developed and well-groomed. He is not ill-appearing or toxic-appearing.  HENT:     Head: Normocephalic.     Right Ear: Hearing, ear canal and external ear normal. There is impacted cerumen.     Left Ear: Hearing, tympanic membrane, ear canal and external ear normal.     Ears:     Comments: Impacted cerumen to right ear noted, unable to view TM.  Ear irrigated with luke warm water, moderate cerumen removed. Scant piece left.  Overall able to view TM, clear, and patient reports improved hearing.   Eyes:     General: Lids are normal.     Extraocular Movements: Extraocular movements intact.     Conjunctiva/sclera: Conjunctivae normal.  Neck:     Thyroid: No thyromegaly.     Vascular: No carotid bruit.  Cardiovascular:     Rate and Rhythm: Normal rate and regular rhythm.     Heart sounds: Normal heart sounds.  Pulmonary:     Effort: No accessory muscle usage or respiratory distress.     Breath sounds: Normal breath sounds.  Abdominal:     General: Bowel sounds are normal. There is no distension.     Palpations: Abdomen is soft.  Tenderness: There is no abdominal tenderness.  Musculoskeletal:     Cervical back: Full passive range of motion without pain.     Right lower leg: No edema.     Left lower leg: No edema.  Lymphadenopathy:     Cervical: No cervical adenopathy.  Skin:    General: Skin is warm.     Capillary Refill: Capillary refill takes less than 2 seconds.     Findings: Bruising present.     Comments: Scattered bruising to upper extremities.  Neurological:     Mental Status: He is alert and oriented to person, place, and time.     Deep Tendon Reflexes: Reflexes are normal and symmetric.     Reflex Scores:      Brachioradialis reflexes are 2+ on the right side and 2+ on the left side.      Patellar reflexes are 2+ on the right side and 2+ on the left side. Psychiatric:        Attention and Perception: Attention normal.        Mood  and Affect: Mood normal.        Speech: Speech normal.        Behavior: Behavior normal. Behavior is cooperative.        Thought Content: Thought content normal.     Results for orders placed or performed in visit on 12/28/22  T4, free  Result Value Ref Range   Free T4 0.83 0.82 - 1.77 ng/dL  TSH  Result Value Ref Range   TSH 3.040 0.450 - 4.500 uIU/mL  Thyroid peroxidase antibody  Result Value Ref Range   Thyroperoxidase Ab SerPl-aCnc <9 0 - 34 IU/mL  Lipid Panel w/o Chol/HDL Ratio  Result Value Ref Range   Cholesterol, Total 215 (H) 100 - 199 mg/dL   Triglycerides 811 (H) 0 - 149 mg/dL   HDL 36 (L) >91 mg/dL   VLDL Cholesterol Cal 63 (H) 5 - 40 mg/dL   LDL Chol Calc (NIH) 478 (H) 0 - 99 mg/dL  VITAMIN D 25 Hydroxy (Vit-D Deficiency, Fractures)  Result Value Ref Range   Vit D, 25-Hydroxy 28.0 (L) 30.0 - 100.0 ng/mL  Magnesium  Result Value Ref Range   Magnesium 2.1 1.6 - 2.3 mg/dL      Assessment & Plan:   Problem List Items Addressed This Visit       Nervous and Auditory   Cerumen impaction - Primary    To right ear, irrigated without complication.  Tolerated well.  Moderate cerumen removed.  Overall patient reports improved hearing.  Recommend if any return of symptoms or worsening to alert PCP and will place referral to ENT.        Musculoskeletal and Integument   Senile osteoporosis   Relevant Orders   DG Bone Density     Follow up plan: Return if symptoms worsen or fail to improve.

## 2023-03-28 NOTE — Assessment & Plan Note (Signed)
To right ear, irrigated without complication.  Tolerated well.  Moderate cerumen removed.  Overall patient reports improved hearing.  Recommend if any return of symptoms or worsening to alert PCP and will place referral to ENT.

## 2023-03-28 NOTE — Patient Instructions (Signed)
Please call to schedule your bone density: [facility]  Address: [address], [place], [place] [address] Phone: [phone]  [facility] [place] [address] [place],  [place]  [address] Phone: [phone]   

## 2023-04-25 IMAGING — US US RENAL
1 series · 14 of 25 positions shown · non-contrast
Comparison: PET CT 04/22/2021

CLINICAL DATA: Stage III B chronic kidney disease. Nephrolithiasis
on prior PET.

EXAM:
RENAL / URINARY TRACT ULTRASOUND COMPLETE

[Series 1: us renal · 14 of 51 slices shown]
[im 1/51]
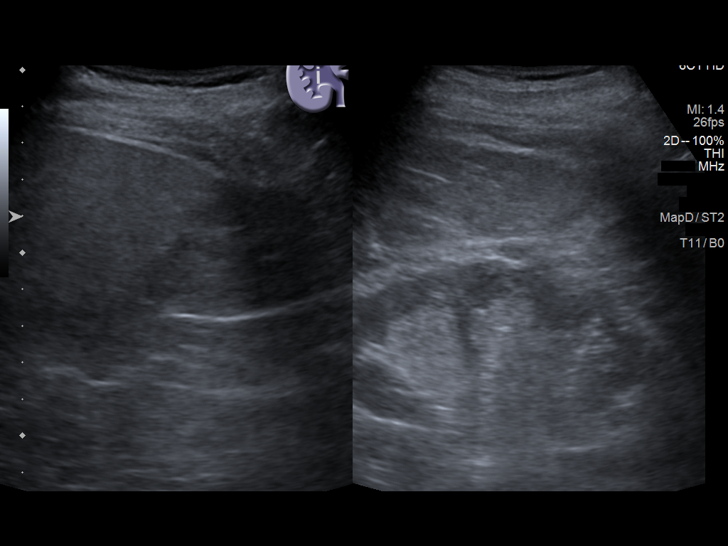
[im 5/51]
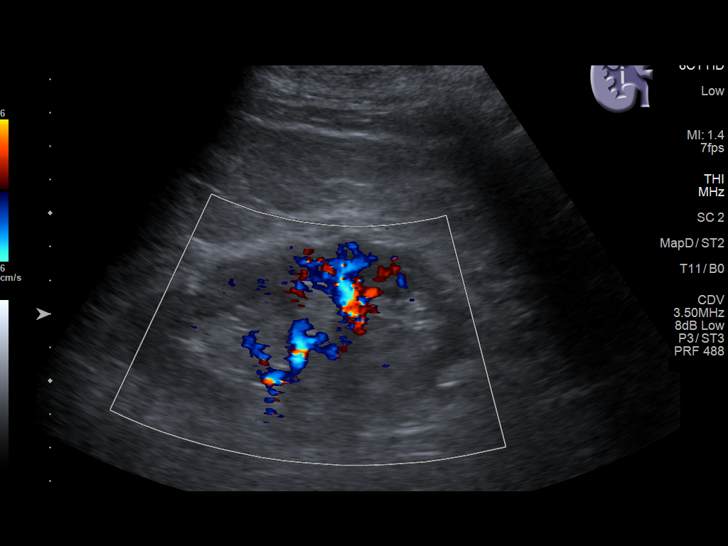
[im 9/51]
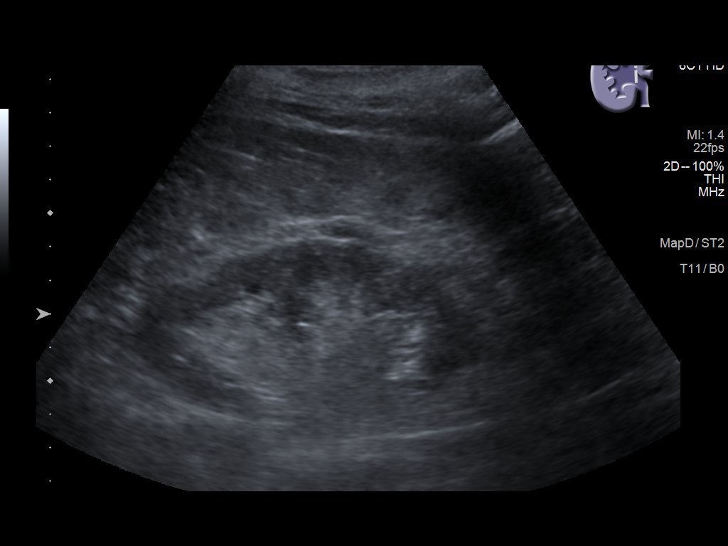
[im 13/51]
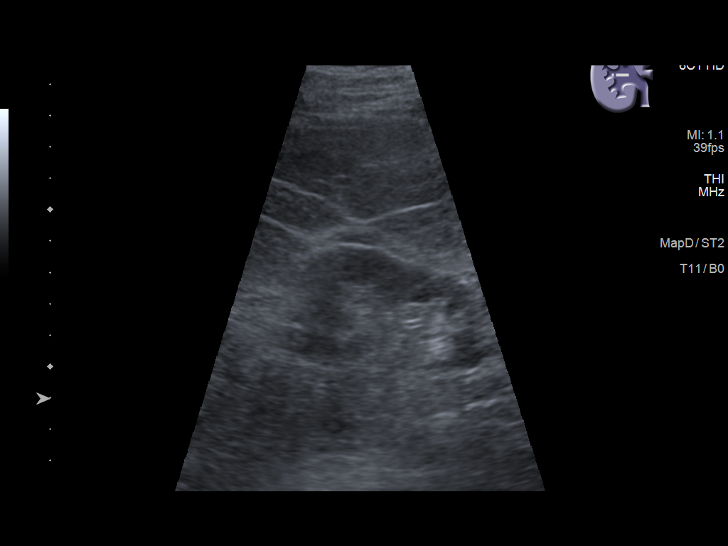
[im 17/51]
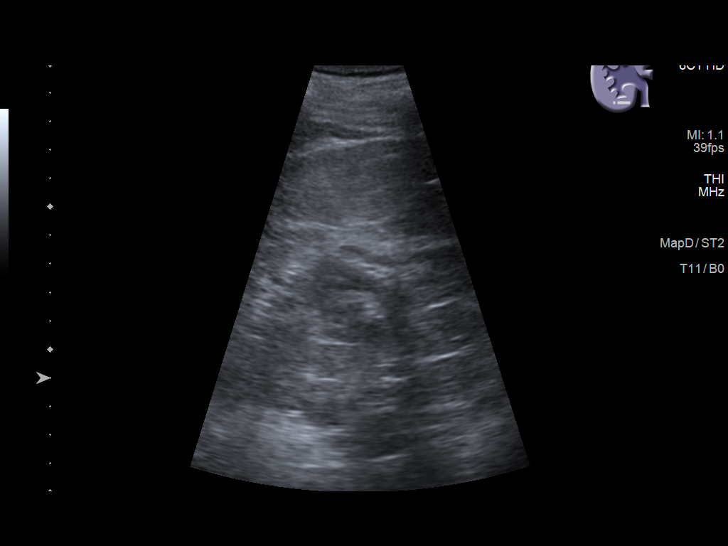
[im 19/51]
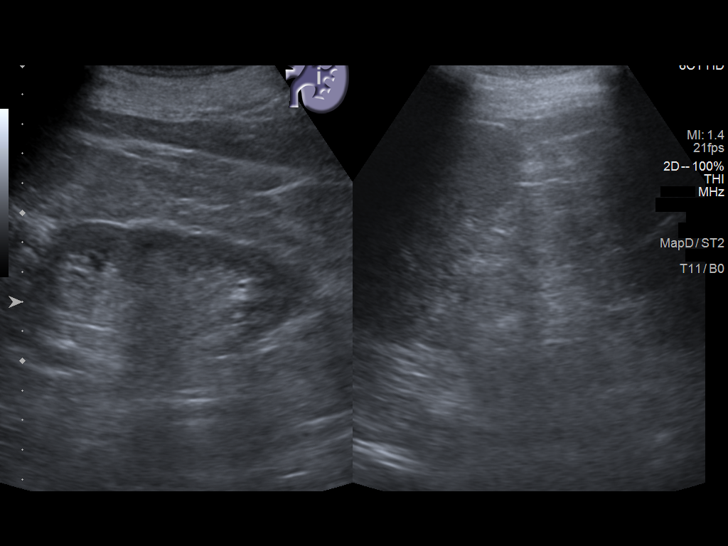
[im 23/51]
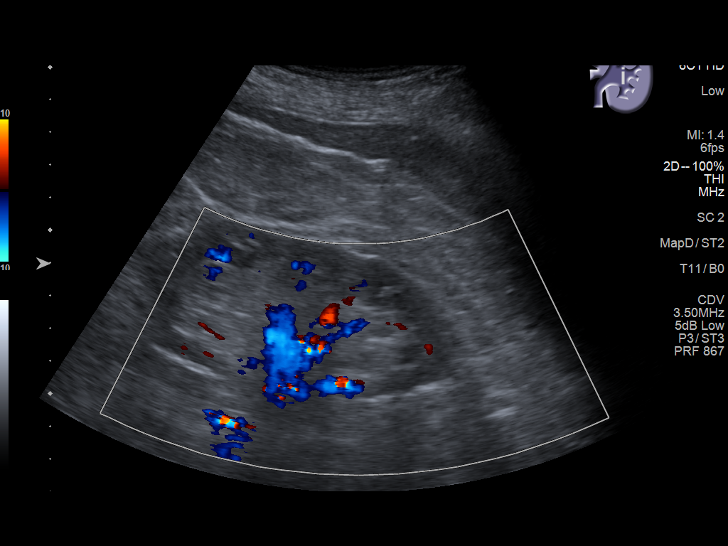
[im 28/51]
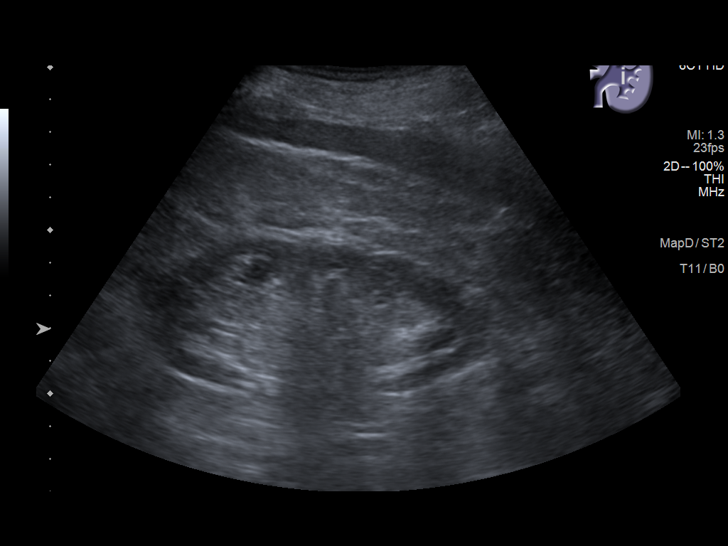
[im 32/51]
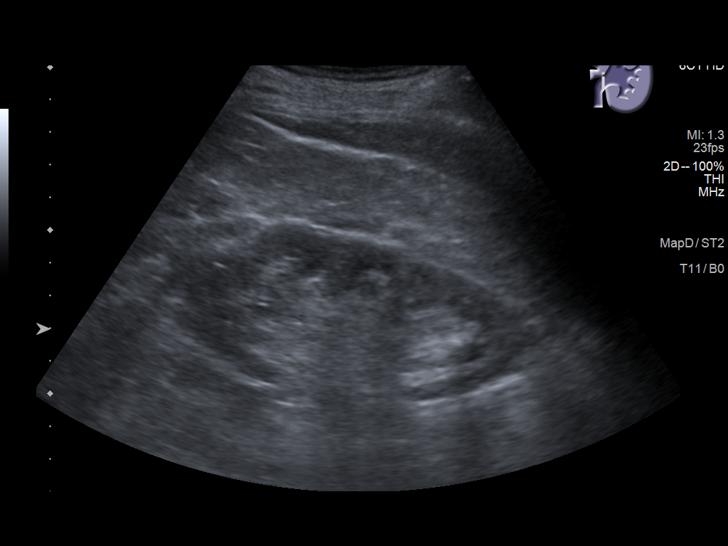
[im 34/51]
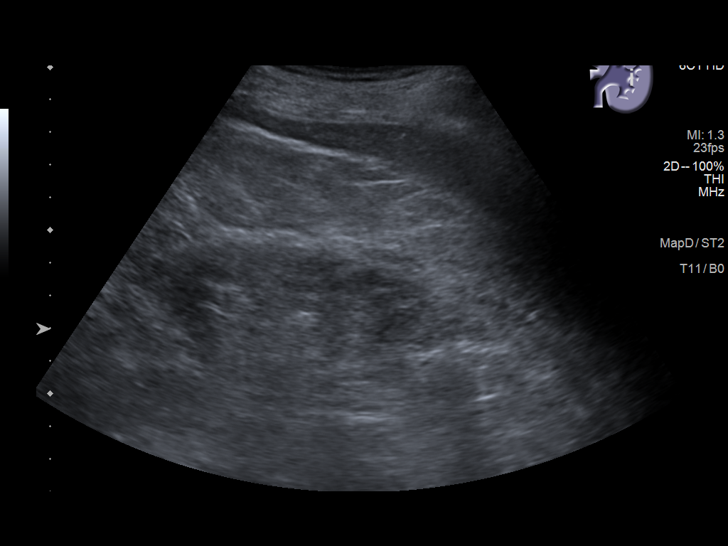
[im 38/51]
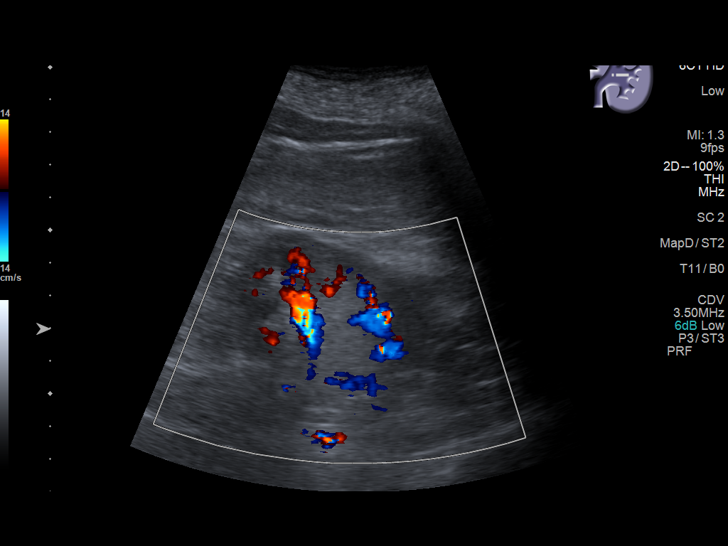
[im 42/51]
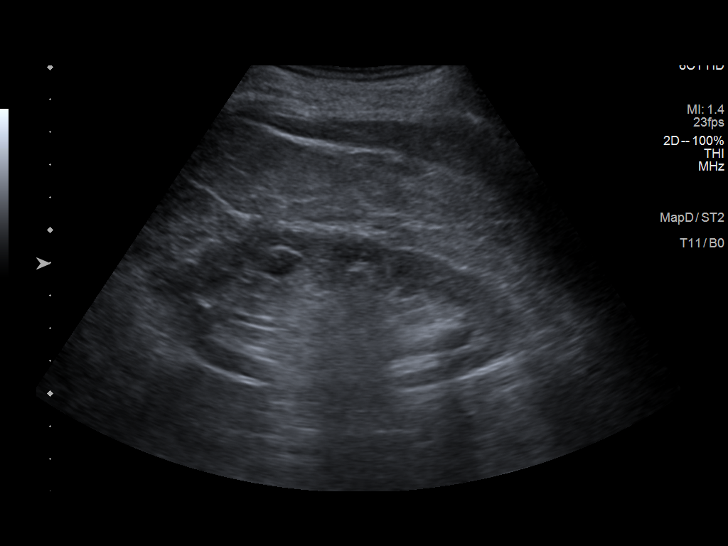
[im 46/51]
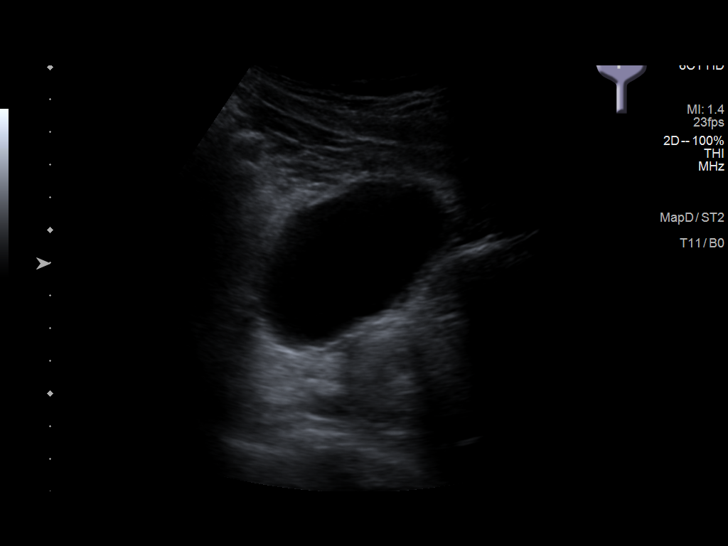
[im 51/51]
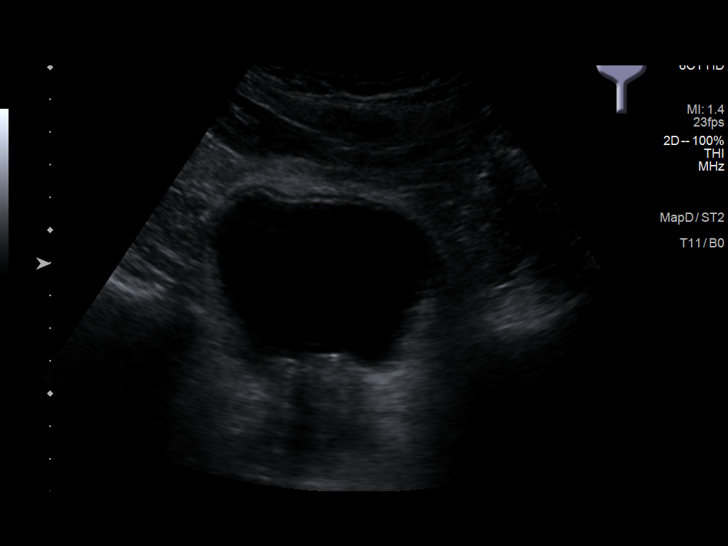

[14 of 25 positions shown; findings below may reference images not displayed]

FINDINGS: Right Kidney:

Renal measurements: 11.0 x 4.9 x 3.6 cm = volume: 102 mL. Increased
parenchymal echogenicity with mild cortical thinning. Mild lobulated
renal contours. No hydronephrosis. No visualized stone or focal
lesion.

Left Kidney:

Renal measurements: 10.0 x 4.6 x 3.9 cm = volume: 95 mL. Mild
increased renal parenchymal echogenicity with mild cortical
thinning. Mild lobulated renal contours. No hydronephrosis.
Shadowing stone in the lower kidney measures 8 mm. The additional
stone on prior PET is not seen. No focal renal lesion.

Bladder:

Appears normal for degree of bladder distention.

Other:

Prostate measures 4.2 x 3.7 x 4.2 cm (volume = 34 cm^3).
IMPRESSION: 1. Left nephrolithiasis.
2. Bilateral renal parenchymal thinning and increased echogenicity
typical of chronic medical renal disease. No obstructive uropathy.

## 2023-06-12 ENCOUNTER — Other Ambulatory Visit: Payer: Self-pay | Admitting: Nurse Practitioner

## 2023-06-14 NOTE — Telephone Encounter (Signed)
Requested medications are due for refill today.  unsure  Requested medications are on the active medications list.  yes  Last refill. 03/28/2023   Future visit scheduled.   yes  Notes to clinic.  Medication is historical    Requested Prescriptions  Pending Prescriptions Disp Refills   pantoprazole (PROTONIX) 40 MG tablet [Pharmacy Med Name: Pantoprazole Sodium 40 MG Oral Tablet Delayed Release] 90 tablet 0    Sig: Take 1 tablet by mouth once daily     Gastroenterology: Proton Pump Inhibitors Passed - 06/12/2023  7:40 AM      Passed - Valid encounter within last 12 months    Recent Outpatient Visits           2 months ago Impacted cerumen of right ear   [facility] [place], Corrie Dandy T, NP   5 months ago Stage 3b chronic kidney disease (HCC)   [place] [place], Corrie Dandy T, NP   11 months ago Dyslipidemia   [place] [facility] Gabriel Cirri, NP   1 year ago Left foot pain   [place] [facility] [place], Avanti, MD   1 year ago Colitis   [place] [facility] Vigg, Avanti, MD       Future Appointments             In 2 weeks Cannady, Dorie Rank, NP [facility]

## 2023-06-20 ENCOUNTER — Other Ambulatory Visit: Payer: Self-pay | Admitting: Nurse Practitioner

## 2023-06-21 NOTE — Telephone Encounter (Signed)
Requested Prescriptions  Pending Prescriptions Disp Refills   levothyroxine (SYNTHROID) 25 MCG tablet [Pharmacy Med Name: Levothyroxine Sodium 25 MCG Oral Tablet] 90 tablet 0    Sig: Take 1 tablet by mouth once daily     Endocrinology:  Hypothyroid Agents Passed - 06/20/2023  5:13 PM      Passed - TSH in normal range and within 360 days    TSH  Date Value Ref Range Status  12/27/2022 3.040 0.450 - 4.500 uIU/mL Final         Passed - Valid encounter within last 12 months    Recent Outpatient Visits           2 months ago Impacted cerumen of right ear   [place] [facility] [place], Corrie Dandy T, NP   5 months ago Stage 3b chronic kidney disease (HCC)   [place] [facility] [place], Corrie Dandy T, NP   12 months ago Dyslipidemia   [place] [facility] Gabriel Cirri, NP   1 year ago Left foot pain   [facility] [facility] Vigg, Avanti, MD   1 year ago Colitis   [place] [facility] Vigg, Avanti, MD       Future Appointments             In 1 week Cannady, Dorie Rank, NP  [facility], [facility]

## 2023-06-23 NOTE — Patient Instructions (Signed)
Be Involved in Caring For Your Health:  Taking Medications When medications are taken as directed, they can greatly improve your health. But if they are not taken as prescribed, they may not work. In some cases, not taking them correctly can be harmful. To help ensure your treatment remains effective and safe, understand your medications and how to take them. Bring your medications to each visit for review by your provider.  Your lab results, notes, and after visit summary will be available on My Chart. We strongly encourage you to use this feature. If lab results are abnormal the clinic will contact you with the appropriate steps. If the clinic does not contact you assume the results are satisfactory. You can always view your results on My Chart. If you have questions regarding your health or results, please contact the clinic during office hours. You can also ask questions on My Chart.  We at [facility] are grateful that you chose [place] to provide your care. We strive to provide evidence-based and compassionate care and are always looking for feedback. If you get a survey from the clinic please complete this so we can hear your opinions.  Eating Plan for Osteoporosis Osteoporosis causes your bones to become weak and brittle. This puts you at greater risk for bone breaks (fractures) from small bumps or falls. Making changes to your diet and increasing your physical activity can help strengthen your bones and improve your overall health. Calcium and vitamin D are nutrients that play an important role in bone health. Vitamin D helps your body use calcium and strengthen bones. It is important to get enough calcium and vitamin D as part of your eating plan for osteoporosis. What are tips for following this plan? Reading food labels Try to get at least 1,000 milligrams (mg) of calcium each day. Look for foods that have at least 50 mg of calcium per serving. Talk with your health care provider  about taking a calcium supplement if you do not get enough calcium from food. Do not have more than 2,500 mg of calcium each day. This is the upper limit for food and nutritional supplements combined. Too much calcium may cause constipation and prevent you from absorbing other important nutrients. Choose foods that contain vitamin D. Take a daily vitamin supplement that contains 800-1,000 international units (IU) of vitamin D. The amount may be different depending on your age, body weight, and where you live. Talk with your dietitian or health care provider about how much vitamin D is right for you. Avoid foods that have more than 300 mg of sodium per serving. Too much sodium can cause your body to lose calcium. Talk with your dietitian or health care provider about how much sodium you are allowed each day. Shopping Do not buy foods with added salt, including: Salted snacks. Rosita Fire. Canned soups. Canned meats. Processed meats, such as bacon or precooked or cured meat like sausages or meat loaves. Smoked fish. Meal planning Eat balanced meals that contain protein foods, fruits and vegetables, and foods rich in calcium and vitamin D. Eat at least 5 servings of fruits and vegetables each day. Eat 5-6 oz (142-170 g) of lean meat, poultry, fish, eggs, or beans each day. Lifestyle Do not use any products that contain nicotine or tobacco, such as cigarettes, e-cigarettes, and chewing tobacco. If you need help quitting, ask your health care provider. If your health care provider recommends that you lose weight: Work with a dietitian to develop an eating  plan that will help you reach your desired weight goal. Exercise for at least 30 minutes a day, 5 or more days a week, or as told by your health care provider. Work with a physical therapist to develop an exercise plan that includes flexibility, balance, and strength exercises. Do not focus only on aerobic exercise. Do not drink alcohol if: Your  health care provider tells you not to drink. You are pregnant, may be pregnant, or are planning to become pregnant. If you drink alcohol: Limit how much you use to: 0-1 drink a day for women. 0-2 drinks a day for men. Be aware of how much alcohol is in your drink. In the [place] one drink equals one 12 oz bottle of beer (355 mL), one 5 oz glass of wine (148 mL), or one 1 oz glass of hard liquor (44 mL). What foods should I eat? Foods high in calcium  Yogurt. Yogurt with fruit. Milk. Evaporated skim milk. Dry milk powder. Calcium-fortified orange juice. Parmesan cheese. Part-skim ricotta cheese. Natural hard cheese. Cream cheese. Cottage cheese. Canned sardines. Canned salmon. Calcium-treated tofu. Calcium-fortified cereal bar. Calcium-fortified cereal. Calcium-fortified graham crackers. Cooked collard greens. Turnip greens. Broccoli. Kale. Almonds. White beans. Corn tortilla. Foods high in vitamin D Cod liver oil. Fatty fish, such as tuna, mackerel, and salmon. Milk. Fortified soy milk. Fortified fruit juice. Yogurt. Margarine. Egg yolks. Foods high in protein Beef. Lamb. Pork tenderloin. Chicken breast. Tuna (canned). Fish fillet. Tofu. Cooked soy beans. Soy patty. Beans (canned or cooked). Cottage cheese. Yogurt. Peanut butter. Pumpkin seeds. Nuts. Sunflower seeds. Hard cheese. Milk or other milk products, such as soy milk. The items listed above may not be a complete list of foods and beverages you can eat. Contact a dietitian for more options. Summary Calcium and vitamin D are nutrients that play an important role in bone health and are an important part of your eating plan for osteoporosis. Eat balanced meals that contain protein foods, fruits and vegetables, and foods rich in calcium and vitamin D. Avoid foods that have more than 300 mg of sodium per serving. Too much sodium can cause your body to lose calcium. Exercise is an important part of prevention and treatment  of osteoporosis. Aim for at least 30 minutes a day, 5 days a week. This information is not intended to replace advice given to you by your health care provider. Make sure you discuss any questions you have with your health care provider. Document Revised: 04/24/2020 Document Reviewed: 04/24/2020 Elsevier Patient Education  2024 [employer].

## 2023-06-28 ENCOUNTER — Ambulatory Visit (INDEPENDENT_AMBULATORY_CARE_PROVIDER_SITE_OTHER): Payer: Medicare Other | Admitting: Nurse Practitioner

## 2023-06-28 ENCOUNTER — Encounter: Payer: Self-pay | Admitting: Nurse Practitioner

## 2023-06-28 VITALS — BP 104/57 | HR 61 | Temp 98.2°F | Ht 68.1 in | Wt 163.4 lb

## 2023-06-28 DIAGNOSIS — Z9089 Acquired absence of other organs: Secondary | ICD-10-CM

## 2023-06-28 DIAGNOSIS — D692 Other nonthrombocytopenic purpura: Secondary | ICD-10-CM

## 2023-06-28 DIAGNOSIS — N1832 Chronic kidney disease, stage 3b: Secondary | ICD-10-CM | POA: Diagnosis not present

## 2023-06-28 DIAGNOSIS — M810 Age-related osteoporosis without current pathological fracture: Secondary | ICD-10-CM

## 2023-06-28 DIAGNOSIS — E039 Hypothyroidism, unspecified: Secondary | ICD-10-CM | POA: Diagnosis not present

## 2023-06-28 DIAGNOSIS — E785 Hyperlipidemia, unspecified: Secondary | ICD-10-CM

## 2023-06-28 DIAGNOSIS — Z9889 Other specified postprocedural states: Secondary | ICD-10-CM

## 2023-06-28 NOTE — Assessment & Plan Note (Signed)
Saw endo in past, will return as needed.

## 2023-06-28 NOTE — Assessment & Plan Note (Signed)
Ongoing.  Continues on daily ASA. Recommend use of gentle skin cleansers at home and monitor for wounds, if present notify provider.

## 2023-06-28 NOTE — Assessment & Plan Note (Signed)
Chronic, ongoing.  Will continue collaboration with nephrology, recent notes and labs reviewed.  Appreciate their input.

## 2023-06-28 NOTE — Assessment & Plan Note (Signed)
Has not seen endocrinology in some time, discussed with this wife and him.  Scheduled for DEXA in October and restart medication as needed.  May need to return to endo in future if IV treatment needed as they discussed their last visit with them.  Could also consider Prolia.  Continue Vit D daily.

## 2023-06-28 NOTE — Assessment & Plan Note (Signed)
Chronic, ongoing.  Continue current medication regimen and adjust as needed.  Lipid panel up to date. 

## 2023-06-28 NOTE — Assessment & Plan Note (Signed)
Chronic, ongoing.  Continue current medication regimen and adjust as needed based on labs. Labs up to date.

## 2023-06-28 NOTE — Progress Notes (Signed)
BP (!) 104/57   Pulse 61   Temp 98.2 F (36.8 C) (Oral)   Ht 5' 8.1" (1.73 m)   Wt 163 lb 6.4 oz (74.1 kg)   SpO2 97%   BMI 24.77 kg/m    Subjective:    Patient ID: Vincent Koch, male    DOB: 05-07-1934, 89 y.o.   MRN: [id number]  HPI: COTTON POIROT is a 89 y.o. male  Chief Complaint  Patient presents with   Chronic Kidney Disease   Hyperlipidemia   Osteoporosis   Hypothyroidism   HYPOTHYROIDISM Continues on Levothyroxine 25 MCG.  Continues on Fenofibrate for triglycerides. Thyroid control status:stable Satisfied with current treatment? yes Medication side effects: no Medication compliance: good compliance Etiology of hypothyroidism: unknown Recent dose adjustment:no Fatigue: no Cold intolerance: no Heat intolerance: no Weight gain: no Weight loss: no Constipation: no Diarrhea/loose stools: no Palpitations: no Lower extremity edema: no Anxiety/depressed mood: no  The ASCVD Risk score (Arnett DK, et al., 2019) failed to calculate for the following reasons:   The 2019 ASCVD risk score is only valid for ages 49 to 44   CHRONIC KIDNEY DISEASE (CKD 3b) Saw nephrology on 06/28/2023, no changes made. Labs on 02/22/23: CRT 2.01, eGFR 32, PTH 32, H/H 15.6/47.1. CKD status: stable Medications renally dose: yes Previous renal evaluation: yes Pneumovax:  Up to Date Influenza Vaccine:  Up to Date   OSTEOPOROSIS Followed with endo in past for osteoporosis, history of parathyroidectomy in 2013.  Has not had a DEXA in some time, is scheduled for October.  Was taken off medication his last visit with endo.  Last visit with endo on 12/27/2018. Satisfied with current treatment?: yes Past osteoporosis medications/treatments: Fosamax, Ibandronate -- did not tolerate these, caused GI issues Adequate calcium & vitamin D: yes Intolerance to bisphosphonates:yes Weight bearing exercises: yes   Relevant past medical, surgical, family and social history reviewed and updated as  indicated. Interim medical history since our last visit reviewed. Allergies and medications reviewed and updated.  Review of Systems  Constitutional:  Negative for activity change, diaphoresis, fatigue and fever.  Respiratory:  Negative for cough, chest tightness, shortness of breath and wheezing.   Cardiovascular:  Negative for chest pain, palpitations and leg swelling.  Gastrointestinal: Negative.   Endocrine: Negative for cold intolerance and heat intolerance.  Neurological: Negative.   Psychiatric/Behavioral: Negative.      Per HPI unless specifically indicated above     Objective:    BP (!) 104/57   Pulse 61   Temp 98.2 F (36.8 C) (Oral)   Ht 5' 8.1" (1.73 m)   Wt 163 lb 6.4 oz (74.1 kg)   SpO2 97%   BMI 24.77 kg/m   Wt Readings from Last 3 Encounters:  06/28/23 163 lb 6.4 oz (74.1 kg)  03/28/23 164 lb 9.6 oz (74.7 kg)  03/14/23 170 lb (77.1 kg)    Physical Exam Vitals and nursing note reviewed.  Constitutional:      General: He is awake. He is not in acute distress.    Appearance: He is well-developed and well-groomed. He is not ill-appearing or toxic-appearing.  HENT:     Head: Normocephalic.     Right Ear: Hearing and external ear normal.     Left Ear: Hearing and external ear normal.  Eyes:     General: Lids are normal.     Extraocular Movements: Extraocular movements intact.     Conjunctiva/sclera: Conjunctivae normal.  Neck:     Thyroid:  No thyromegaly.     Vascular: No carotid bruit.  Cardiovascular:     Rate and Rhythm: Normal rate and regular rhythm.     Heart sounds: Normal heart sounds.  Pulmonary:     Effort: No accessory muscle usage or respiratory distress.     Breath sounds: Normal breath sounds.  Abdominal:     General: Bowel sounds are normal. There is no distension.     Palpations: Abdomen is soft.     Tenderness: There is no abdominal tenderness.  Musculoskeletal:     Cervical back: Full passive range of motion without pain.      Right lower leg: No edema.     Left lower leg: No edema.  Lymphadenopathy:     Cervical: No cervical adenopathy.  Skin:    General: Skin is warm.     Capillary Refill: Capillary refill takes less than 2 seconds.     Findings: Bruising present.     Comments: Scattered bruising to upper extremities.  Neurological:     Mental Status: He is alert and oriented to person, place, and time.     Deep Tendon Reflexes: Reflexes are normal and symmetric.     Reflex Scores:      Brachioradialis reflexes are 2+ on the right side and 2+ on the left side.      Patellar reflexes are 2+ on the right side and 2+ on the left side. Psychiatric:        Attention and Perception: Attention normal.        Mood and Affect: Mood normal.        Speech: Speech normal.        Behavior: Behavior normal. Behavior is cooperative.        Thought Content: Thought content normal.     Results for orders placed or performed in visit on 12/28/22  T4, free  Result Value Ref Range   Free T4 0.83 0.82 - 1.77 ng/dL  TSH  Result Value Ref Range   TSH 3.040 0.450 - 4.500 uIU/mL  Thyroid peroxidase antibody  Result Value Ref Range   Thyroperoxidase Ab SerPl-aCnc <9 0 - 34 IU/mL  Lipid Panel w/o Chol/HDL Ratio  Result Value Ref Range   Cholesterol, Total 215 (H) 100 - 199 mg/dL   Triglycerides 409 (H) 0 - 149 mg/dL   HDL 36 (L) >81 mg/dL   VLDL Cholesterol Cal 63 (H) 5 - 40 mg/dL   LDL Chol Calc (NIH) 191 (H) 0 - 99 mg/dL  VITAMIN D 25 Hydroxy (Vit-D Deficiency, Fractures)  Result Value Ref Range   Vit D, 25-Hydroxy 28.0 (L) 30.0 - 100.0 ng/mL  Magnesium  Result Value Ref Range   Magnesium 2.1 1.6 - 2.3 mg/dL      Assessment & Plan:   Problem List Items Addressed This Visit       Cardiovascular and Mediastinum   Purpura senilis (HCC)    Ongoing.  Continues on daily ASA. Recommend use of gentle skin cleansers at home and monitor for wounds, if present notify provider.        Endocrine   Hypothyroidism     Chronic, ongoing.  Continue current medication regimen and adjust as needed based on labs.  Labs up to date.        Musculoskeletal and Integument   Senile osteoporosis    Has not seen endocrinology in some time, discussed with this wife and him.  Scheduled for DEXA in October and restart medication as needed.  May need to return to endo in future if IV treatment needed as they discussed their last visit with them.  Could also consider Prolia.  Continue Vit D daily.        Genitourinary   CKD (chronic kidney disease) stage 3, GFR 30-59 ml/min (HCC) - Primary    Chronic, ongoing.  Will continue collaboration with nephrology, recent notes and labs reviewed.  Appreciate their input.        Other   Dyslipidemia    Chronic, ongoing.  Continue current medication regimen and adjust as needed.  Lipid panel up to date.      History of parathyroidectomy    Saw endo in past, will return as needed.         Follow up plan: Return in about 6 months (around 12/29/2023) for OSTEOPOROSIS, THYROID, CKD.

## 2023-06-29 ENCOUNTER — Other Ambulatory Visit: Payer: Medicare Other

## 2023-09-13 ENCOUNTER — Other Ambulatory Visit: Payer: Medicare Other

## 2023-09-15 ENCOUNTER — Ambulatory Visit
Admission: RE | Admit: 2023-09-15 | Discharge: 2023-09-15 | Disposition: A | Discharge status: Home / Self Care | Payer: Medicare Other | Source: Ambulatory Visit | Attending: Nurse Practitioner | Admitting: Nurse Practitioner

## 2023-09-15 DIAGNOSIS — M810 Age-related osteoporosis without current pathological fracture: Secondary | ICD-10-CM | POA: Insufficient documentation

## 2023-09-15 NOTE — Progress Notes (Signed)
Contacted via MyChart   Good morning Vincent Koch your bone density has returned and there is no significant difference from previous checks.  Continues to note osteoporosis.  I do recommend heading back to endocrinology to discuss alternate treatments with them as they brought up last visit with you.  Any questions? Keep being amazing!!  Thank you for allowing me to participate in your care.  I appreciate you. Kindest regards, Chasitty Hehl

## 2023-09-16 ENCOUNTER — Other Ambulatory Visit: Payer: Self-pay | Admitting: Nurse Practitioner

## 2023-09-16 NOTE — Telephone Encounter (Signed)
Requested Prescriptions  Pending Prescriptions Disp Refills   levothyroxine (SYNTHROID) 25 MCG tablet [Pharmacy Med Name: Levothyroxine Sodium 25 MCG Oral Tablet] 90 tablet 1    Sig: Take 1 tablet by mouth once daily     Endocrinology:  Hypothyroid Agents Passed - 09/16/2023  6:40 AM      Passed - TSH in normal range and within 360 days    TSH  Date Value Ref Range Status  12/27/2022 3.040 0.450 - 4.500 uIU/mL Final         Passed - Valid encounter within last 12 months    Recent Outpatient Visits           2 months ago Stage 3b chronic kidney disease (HCC)   [place] [facility] [place], Corrie Dandy T, NP   5 months ago Impacted cerumen of right ear   [place] [facility] [place], Corrie Dandy T, NP   8 months ago Stage 3b chronic kidney disease (HCC)   [place] [facility] [place], Dorie Rank, NP   1 year ago Dyslipidemia   [place] [facility] Gabriel Cirri, NP   1 year ago Left foot pain   [place] [facility] [place], Avanti, MD       Future Appointments             In 3 months Cannady, Dorie Rank, NP [place] [facility]

## 2023-09-18 IMAGING — DX DG FOOT COMPLETE 3+V*R*
3 series · 3 of 3 positions shown · non-contrast
Comparison: None.

CLINICAL DATA: Pain and swelling dorsal foot toward toes

EXAM:
RIGHT FOOT COMPLETE - 3+ VIEW

[foot ap]
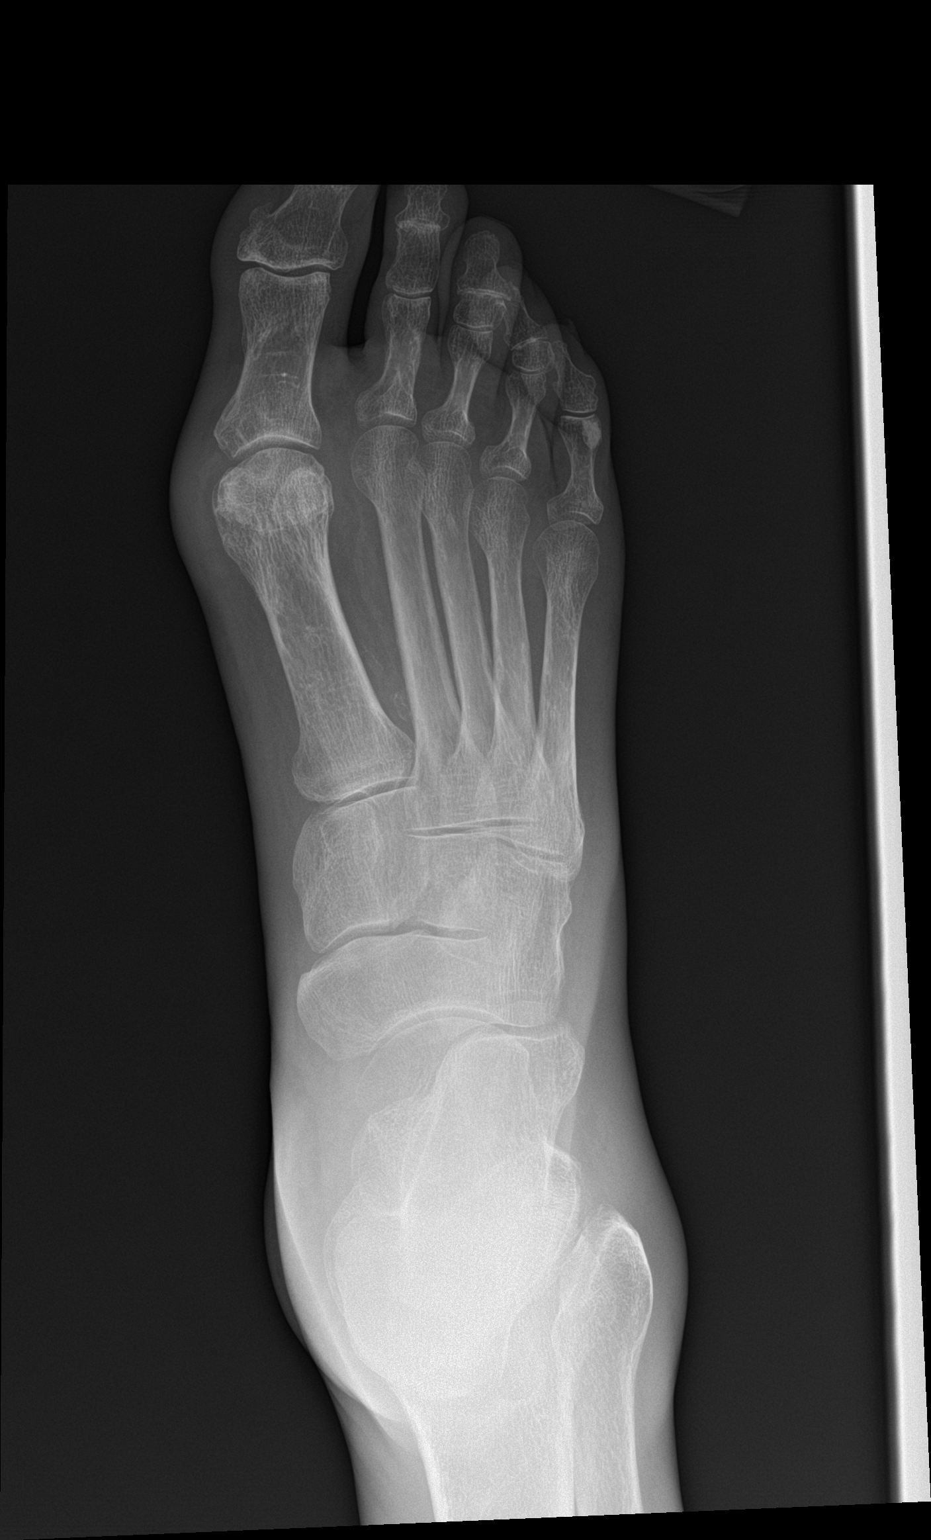

[foot obl]
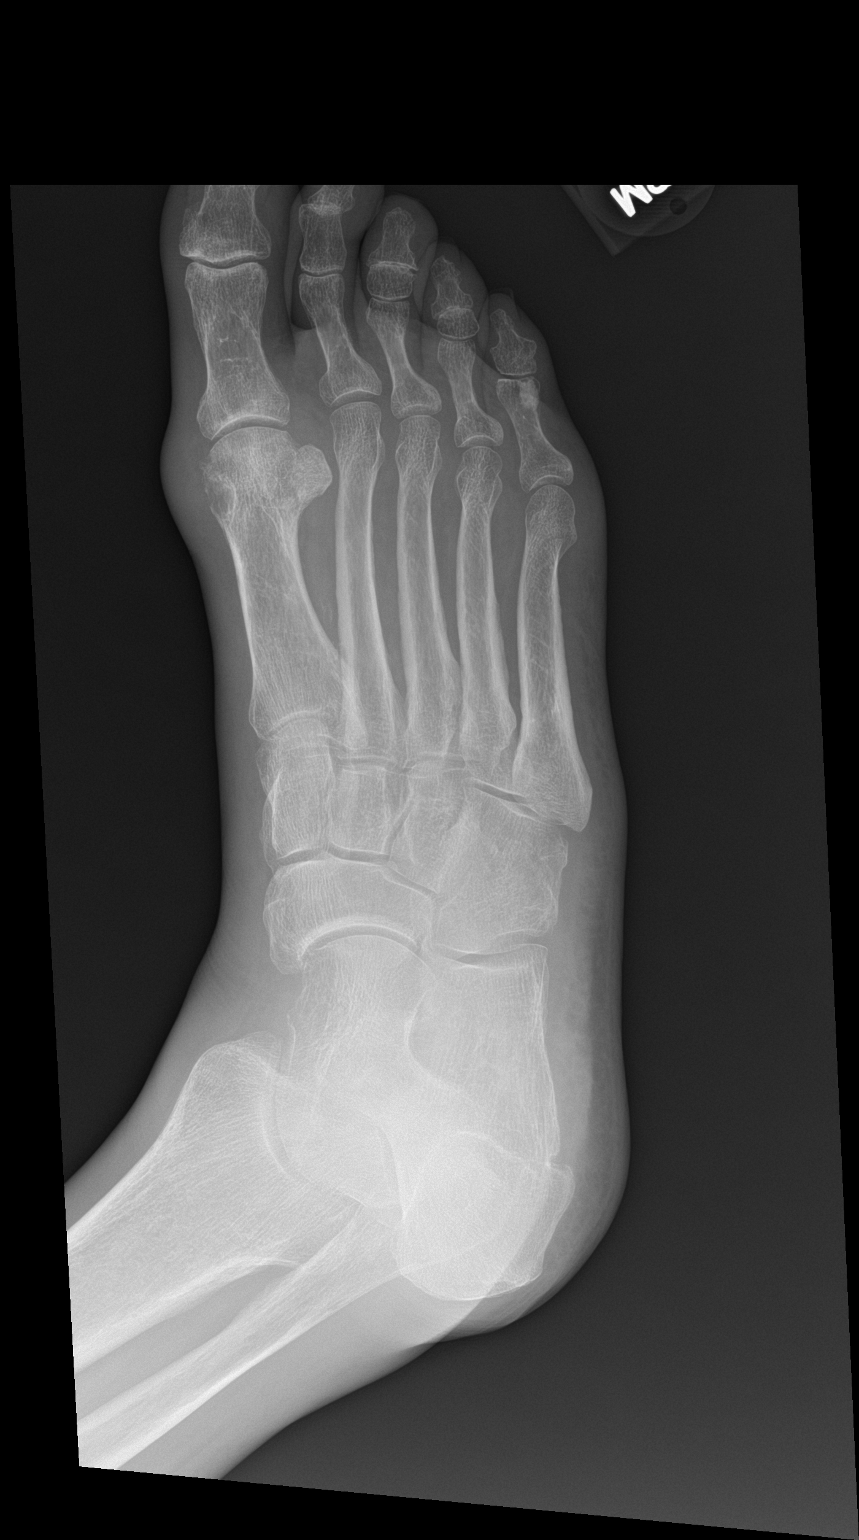

[foot lat]
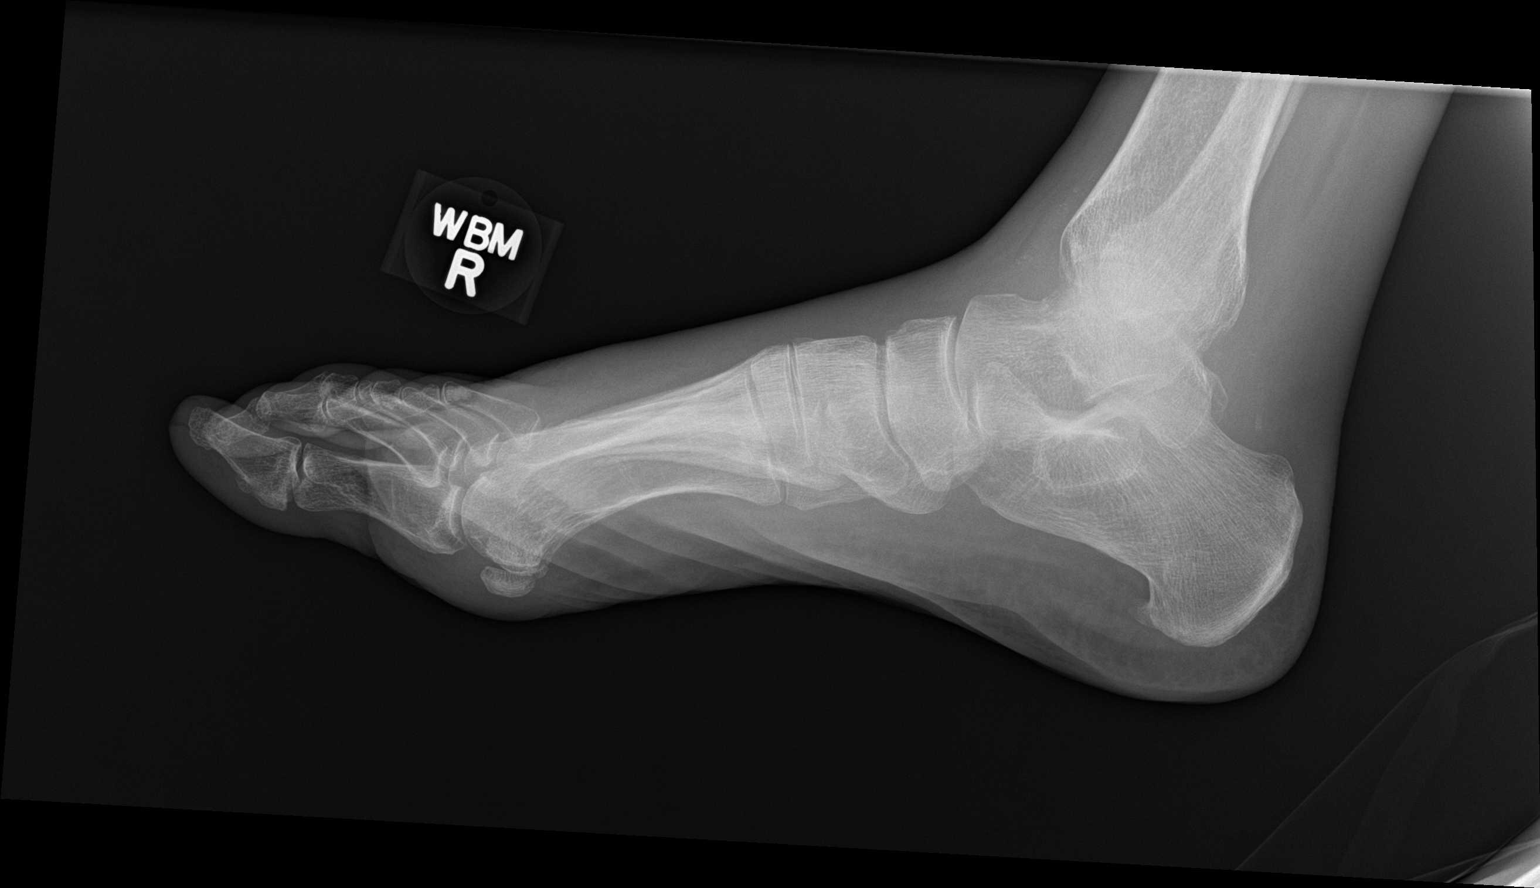

[3 of 3 positions shown; findings below may reference images not displayed]

FINDINGS: Frontal, oblique, and lateral views of the right foot are obtained.
No acute fracture, subluxation, or dislocation. Small bone island
within the fifth proximal phalanx. Diffuse interphalangeal joint
space narrowing, as well as joint space narrowing of the first
metatarsophalangeal joint, consistent with osteoarthritis. There is
significant soft tissue swelling of the forefoot at the level of the
metatarsophalangeal joints, greatest at the first digit. Subtle
erosive changes seen within the medial margin of the first
metatarsal head. Small inferior calcaneal spur.
IMPRESSION: 1. Subtle erosive change medial aspect first metatarsal head, with
overlying soft tissue swelling. Does the patient have any history of
gout?
2. Mild diffuse soft tissue swelling throughout the forefoot.
3. Mild diffuse osteoarthritis.

## 2023-12-25 NOTE — Patient Instructions (Signed)

## 2023-12-30 ENCOUNTER — Encounter: Payer: Self-pay | Admitting: Nurse Practitioner

## 2023-12-30 ENCOUNTER — Ambulatory Visit: Payer: Medicare Other | Admitting: Nurse Practitioner

## 2023-12-30 VITALS — BP 124/77 | HR 71 | Ht 68.1 in | Wt 166.0 lb

## 2023-12-30 DIAGNOSIS — E785 Hyperlipidemia, unspecified: Secondary | ICD-10-CM

## 2023-12-30 DIAGNOSIS — E039 Hypothyroidism, unspecified: Secondary | ICD-10-CM | POA: Diagnosis not present

## 2023-12-30 DIAGNOSIS — D692 Other nonthrombocytopenic purpura: Secondary | ICD-10-CM

## 2023-12-30 DIAGNOSIS — K219 Gastro-esophageal reflux disease without esophagitis: Secondary | ICD-10-CM

## 2023-12-30 DIAGNOSIS — M810 Age-related osteoporosis without current pathological fracture: Secondary | ICD-10-CM

## 2023-12-30 DIAGNOSIS — Z9089 Acquired absence of other organs: Secondary | ICD-10-CM

## 2023-12-30 DIAGNOSIS — N1832 Chronic kidney disease, stage 3b: Secondary | ICD-10-CM

## 2023-12-30 DIAGNOSIS — Z23 Encounter for immunization: Secondary | ICD-10-CM

## 2023-12-30 DIAGNOSIS — Z9889 Other specified postprocedural states: Secondary | ICD-10-CM

## 2023-12-30 DIAGNOSIS — I890 Lymphedema, not elsewhere classified: Secondary | ICD-10-CM

## 2023-12-30 NOTE — Assessment & Plan Note (Signed)
 Stable with recent DEXA improved.  Saw endo in past. May need to return to endo in future if IV treatment needed as they discussed their last visit with them.  Could also consider Prolia.  Continue Vit D daily. Plan on repeat DEXA in 2026.

## 2023-12-30 NOTE — Assessment & Plan Note (Signed)
 Chronic, ongoing.  Continue current medication regimen and adjust as needed. Lipid panel today.

## 2023-12-30 NOTE — Progress Notes (Signed)
 BP 124/77 (BP Location: Left Arm, Patient Position: Sitting, Cuff Size: Large)   Pulse 71   Ht 5' 8.1 (1.73 m)   Wt 166 lb (75.3 kg)   SpO2 94%   BMI 25.17 kg/m    Subjective:    Patient ID: Vincent Koch, male    DOB: 05-06-1934, 89 y.o.   MRN: [id number]  HPI: Vincent Koch is a 89 y.o. male  Chief Complaint  Patient presents with   Hypothyroidism   Chronic Kidney Disease   Wife at bedside with him.  Reports hearing is declining some.    HYPOTHYROIDISM Taking Levothyroxine  25 MCG.    Followed with endo in past for osteoporosis, history of parathyroidectomy in 2013.  Recent DEXA 09/15/23, showed osteopenia, T-score -2.2.  No recent falls or fractures. Thyroid  control status:stable Satisfied with current treatment? yes Medication side effects: no Medication compliance: good compliance Etiology of hypothyroidism: unknown Recent dose adjustment:no Fatigue: no Cold intolerance: no Heat intolerance: yes Weight gain: no Weight loss: no Constipation: no Diarrhea/loose stools: no Palpitations: no Lower extremity edema: no Anxiety/depressed mood: no  The ASCVD Risk score (Arnett DK, et al., 2019) failed to calculate for the following reasons:   The 2019 ASCVD risk score is only valid for ages 87 to 36   HYPERLIPIDEMIA Taking Fenofibrate  daily. Hyperlipidemia status: good compliance Satisfied with current treatment?  yes Side effects:  no Medication compliance: good compliance Supplements: none Aspirin:  yes The ASCVD Risk score (Arnett DK, et al., 2019) failed to calculate for the following reasons:   The 2019 ASCVD risk score is only valid for ages 45 to 72 Chest pain:  no Coronary artery disease:  no Family history CAD:  yes - mother stroke Family history early CAD:  no   CHRONIC KIDNEY DISEASE Last nephrology 10/11/23 -- CRT 1.81, eGFR 36, PTH 33. Returns to see them in March. CKD status: stable Medications renally dose: yes Previous renal  evaluation: yes Pneumovax:  Up to Date Influenza Vaccine:  Up to Date      12/30/2023    1:47 PM 06/28/2023    1:34 PM 03/14/2023    3:33 PM 06/22/2022    2:56 PM 04/20/2022    3:48 PM  Depression screen PHQ 2/9  Decreased Interest 0 0 0 0 0  Down, Depressed, Hopeless 0 0 0 0 0  PHQ - 2 Score 0 0 0 0 0  Altered sleeping 0 0 0 0 0  Tired, decreased energy 0 0 0 0 0  Change in appetite 0 0 0 0 0  Feeling bad or failure about yourself  0 0 0 0 0  Trouble concentrating 0 0 0 0 0  Moving slowly or fidgety/restless 0 0 0 0 0  Suicidal thoughts 0 0 0 0 0  PHQ-9 Score 0 0 0 0 0  Difficult doing work/chores  Not difficult at all Not difficult at all Not difficult at all Not difficult at all       12/30/2023    1:47 PM 06/28/2023    1:36 PM 06/22/2022    2:56 PM 04/20/2022    3:48 PM  GAD 7 : Generalized Anxiety Score  Nervous, Anxious, on Edge 0 0 0 0  Control/stop worrying 0 0 0 0  Worry too much - different things 0 0 0 0  Trouble relaxing 0 0 0 0  Restless 0 0 0 0  Easily annoyed or irritable 0 0 0 0  Afraid - awful  might happen 0 0 0 0  Total GAD 7 Score 0 0 0 0  Anxiety Difficulty  Not difficult at all Not difficult at all Not difficult at all      Relevant past medical, surgical, family and social history reviewed and updated as indicated. Interim medical history since our last visit reviewed. Allergies and medications reviewed and updated.  Review of Systems  Constitutional:  Negative for activity change, diaphoresis, fatigue and fever.  Respiratory:  Negative for cough, chest tightness, shortness of breath and wheezing.   Cardiovascular:  Negative for chest pain, palpitations and leg swelling.  Gastrointestinal: Negative.   Endocrine: Negative for cold intolerance and heat intolerance.  Neurological: Negative.   Psychiatric/Behavioral: Negative.      Per HPI unless specifically indicated above     Objective:    BP 124/77 (BP Location: Left Arm, Patient Position: Sitting,  Cuff Size: Large)   Pulse 71   Ht 5' 8.1 (1.73 m)   Wt 166 lb (75.3 kg)   SpO2 94%   BMI 25.17 kg/m   Wt Readings from Last 3 Encounters:  12/30/23 166 lb (75.3 kg)  06/28/23 163 lb 6.4 oz (74.1 kg)  03/28/23 164 lb 9.6 oz (74.7 kg)    Physical Exam Vitals and nursing note reviewed.  Constitutional:      General: He is awake. He is not in acute distress.    Appearance: He is well-developed and well-groomed. He is not ill-appearing or toxic-appearing.  HENT:     Head: Normocephalic.     Right Ear: Hearing and external ear normal.     Left Ear: Hearing and external ear normal.  Eyes:     General: Lids are normal.     Extraocular Movements: Extraocular movements intact.     Conjunctiva/sclera: Conjunctivae normal.  Neck:     Thyroid : No thyromegaly.     Vascular: No carotid bruit.  Cardiovascular:     Rate and Rhythm: Normal rate and regular rhythm.     Heart sounds: Normal heart sounds.  Pulmonary:     Effort: No accessory muscle usage or respiratory distress.     Breath sounds: Normal breath sounds.  Abdominal:     General: Bowel sounds are normal. There is no distension.     Palpations: Abdomen is soft.     Tenderness: There is no abdominal tenderness.  Musculoskeletal:     Cervical back: Full passive range of motion without pain.     Right lower leg: No edema.     Left lower leg: No edema.  Lymphadenopathy:     Cervical: No cervical adenopathy.  Skin:    General: Skin is warm.     Capillary Refill: Capillary refill takes less than 2 seconds.     Findings: Bruising present.     Comments: Scattered bruising to upper extremities.  Neurological:     Mental Status: He is alert and oriented to person, place, and time.     Deep Tendon Reflexes: Reflexes are normal and symmetric.     Reflex Scores:      Brachioradialis reflexes are 2+ on the right side and 2+ on the left side.      Patellar reflexes are 2+ on the right side and 2+ on the left side. Psychiatric:         Attention and Perception: Attention normal.        Mood and Affect: Mood normal.        Speech: Speech normal.  Behavior: Behavior normal. Behavior is cooperative.        Thought Content: Thought content normal.    Results for orders placed or performed in visit on 12/28/22  T4, free   Collection Time: 12/27/22 12:00 AM  Result Value Ref Range   Free T4 0.83 0.82 - 1.77 ng/dL  TSH   Collection Time: 12/27/22 12:00 AM  Result Value Ref Range   TSH 3.040 0.450 - 4.500 uIU/mL  Thyroid  peroxidase antibody   Collection Time: 12/27/22 12:00 AM  Result Value Ref Range   Thyroperoxidase Ab SerPl-aCnc <9 0 - 34 IU/mL  Lipid Panel w/o Chol/HDL Ratio   Collection Time: 12/27/22 12:00 AM  Result Value Ref Range   Cholesterol, Total 215 (H) 100 - 199 mg/dL   Triglycerides 637 (H) 0 - 149 mg/dL   HDL 36 (L) >60 mg/dL   VLDL Cholesterol Cal 63 (H) 5 - 40 mg/dL   LDL Chol Calc (NIH) 883 (H) 0 - 99 mg/dL  VITAMIN D  25 Hydroxy (Vit-D Deficiency, Fractures)   Collection Time: 12/27/22 12:00 AM  Result Value Ref Range   Vit D, 25-Hydroxy 28.0 (L) 30.0 - 100.0 ng/mL  Magnesium   Collection Time: 12/27/22 12:00 AM  Result Value Ref Range   Magnesium 2.1 1.6 - 2.3 mg/dL      Assessment & Plan:   Problem List Items Addressed This Visit       Cardiovascular and Mediastinum   Purpura senilis (HCC)   Ongoing.  Continues on daily ASA. Recommend use of gentle skin cleansers at home and monitor for wounds, if present notify provider.        Endocrine   Hypothyroidism   Chronic, ongoing.  Continue current medication regimen and adjust as needed based on labs.  Labs today.      Relevant Orders   T4, free   TSH     Musculoskeletal and Integument   Senile osteoporosis   Stable with recent DEXA improved.  Saw endo in past. May need to return to endo in future if IV treatment needed as they discussed their last visit with them.  Could also consider Prolia.  Continue Vit D daily. Plan  on repeat DEXA in 2026.        Genitourinary   CKD (chronic kidney disease) stage 3, GFR 30-59 ml/min (HCC) - Primary   Chronic, ongoing.  Will continue collaboration with nephrology, recent notes and labs reviewed.  Appreciate their input.        Other   Dyslipidemia   Chronic, ongoing.  Continue current medication regimen and adjust as needed.  Lipid panel today.      Relevant Orders   Lipid Panel w/o Chol/HDL Ratio   History of parathyroidectomy   Saw endo in past, will return as needed.      Other Visit Diagnoses       Flu vaccine need       Flu vaccine today, educated patient.   Relevant Orders   Flu Vaccine Trivalent High Dose (Fluad)        Follow up plan: Return in about 6 months (around 06/28/2024) for THYROID  AND HLD.

## 2023-12-30 NOTE — Assessment & Plan Note (Signed)
Ongoing.  Continues on daily ASA. Recommend use of gentle skin cleansers at home and monitor for wounds, if present notify provider.

## 2023-12-30 NOTE — Assessment & Plan Note (Signed)
Saw endo in past, will return as needed.

## 2023-12-30 NOTE — Assessment & Plan Note (Signed)
Chronic, ongoing.  Will continue collaboration with nephrology, recent notes and labs reviewed.  Appreciate their input.

## 2023-12-30 NOTE — Assessment & Plan Note (Signed)
Chronic, ongoing.  Continue current medication regimen and adjust as needed based on labs. Labs today.

## 2023-12-31 LAB — LIPID PANEL W/O CHOL/HDL RATIO
Cholesterol, Total: 193 mg/dL (ref 100–199)
HDL: 34 mg/dL — ABNORMAL LOW (ref >39)
LDL Chol Calc (NIH): 120 mg/dL — ABNORMAL HIGH (ref 0–99)
Triglycerides: 219 mg/dL — ABNORMAL HIGH (ref 0–149)
VLDL Cholesterol Cal: 39 mg/dL (ref 5–40)

## 2023-12-31 LAB — TSH: TSH: 4.46 u[IU]/mL (ref 0.450–4.500)

## 2023-12-31 LAB — T4, FREE: Free T4: 1.01 ng/dL (ref 0.82–1.77)

## 2024-01-01 ENCOUNTER — Encounter: Payer: Self-pay | Admitting: Nurse Practitioner

## 2024-01-01 NOTE — Progress Notes (Signed)
 Contacted via MyChart   Good afternoon Sony, your labs have returned.  Thyroid  looks good with no medication changes needed.  Lipid panel continues to show elevations in LDL.  Have you taken statin therapy before?  If so what happened?  May be good to start a low dose of Rosuvastatin.  Any questions? Keep being amazing!!  Thank you for allowing me to participate in your care.  I appreciate you. Kindest regards, Huie Ghuman

## 2024-03-10 ENCOUNTER — Other Ambulatory Visit: Payer: Self-pay | Admitting: Nurse Practitioner

## 2024-03-12 NOTE — Telephone Encounter (Signed)
 Requested Prescriptions  Pending Prescriptions Disp Refills   fenofibrate  (TRICOR ) 145 MG tablet [Pharmacy Med Name: Fenofibrate  145 MG Oral Tablet] 90 tablet 0    Sig: Take 1 tablet by mouth once daily     Cardiovascular:  Antilipid - Fibric Acid Derivatives Failed - 03/12/2024 10:23 AM      Failed - ALT in normal range and within 360 days    ALT  Date Value Ref Range Status  06/23/2022 25 0 - 44 IU/L Final         Failed - AST in normal range and within 360 days    AST  Date Value Ref Range Status  06/23/2022 36 0 - 40 IU/L Final         Failed - Cr in normal range and within 360 days    Creatinine, Ser  Date Value Ref Range Status  06/23/2022 1.57 (H) 0.76 - 1.27 mg/dL Final         Failed - HGB in normal range and within 360 days    Hemoglobin  Date Value Ref Range Status  03/08/2022 12.7 (L) 13.0 - 17.7 g/dL Final         Failed - HCT in normal range and within 360 days    Hematocrit  Date Value Ref Range Status  03/08/2022 39.1 37.5 - 51.0 % Final         Failed - PLT in normal range and within 360 days    Platelets  Date Value Ref Range Status  03/08/2022 327 150 - 450 x10E3/uL Final         Failed - WBC in normal range and within 360 days    WBC  Date Value Ref Range Status  03/08/2022 6.9 3.4 - 10.8 x10E3/uL Final  02/25/2022 12.1 (H) 4.0 - 10.5 K/uL Final         Failed - eGFR is 30 or above and within 360 days    GFR calc Af Amer  Date Value Ref Range Status  09/18/2020 50 (L) >59 mL/min/1.73 Final    Comment:    **In accordance with recommendations from the NKF-ASN Task force,**   [facility] is in the process of updating its eGFR calculation to the   2021 CKD-EPI creatinine equation that estimates kidney function   without a race variable.    GFR, Estimated  Date Value Ref Range Status  02/25/2022 52 (L) >60 mL/min Final    Comment:    (NOTE) Calculated using the CKD-EPI Creatinine Equation (2021)    eGFR  Date Value Ref Range Status   06/23/2022 43 (L) >59 mL/min/1.73 Final         Failed - Lipid Panel in normal range within the last 12 months    Cholesterol, Total  Date Value Ref Range Status  12/30/2023 193 100 - 199 mg/dL Final   LDL Chol Calc (NIH)  Date Value Ref Range Status  12/30/2023 120 (H) 0 - 99 mg/dL Final   HDL  Date Value Ref Range Status  12/30/2023 34 (L) >39 mg/dL Final   Triglycerides  Date Value Ref Range Status  12/30/2023 219 (H) 0 - 149 mg/dL Final         Passed - Valid encounter within last 12 months    Recent Outpatient Visits           2 months ago Stage 3b chronic kidney disease (HCC)   [place] [facility], Lavelle Posey, NP  Future Appointments             In 3 months Cannady, Lavelle Posey, NP [place] [facility]             levothyroxine  (SYNTHROID ) 25 MCG tablet [Pharmacy Med Name: Levothyroxine  Sodium 25 MCG Oral Tablet] 90 tablet 1    Sig: Take 1 tablet by mouth once daily     Endocrinology:  Hypothyroid Agents Passed - 03/12/2024 10:23 AM      Passed - TSH in normal range and within 360 days    TSH  Date Value Ref Range Status  12/30/2023 4.460 0.450 - 4.500 uIU/mL Final         Passed - Valid encounter within last 12 months    Recent Outpatient Visits           2 months ago Stage 3b chronic kidney disease (HCC)   [place] [facility] [place], Lavelle Posey, NP       Future Appointments             In 3 months Cannady, Jolene T, NP [facility] [facility], [facility]

## 2024-03-12 NOTE — Telephone Encounter (Signed)
 Requested medications are due for refill today.  yes  Requested medications are on the active medications list.  yes  Last refill. 09/16/2023 #90 1 rf  Future visit scheduled.   yes  Notes to clinic.  Expired labs.    Requested Prescriptions  Pending Prescriptions Disp Refills   fenofibrate  (TRICOR ) 145 MG tablet [Pharmacy Med Name: Fenofibrate  145 MG Oral Tablet] 90 tablet 0    Sig: Take 1 tablet by mouth once daily     Cardiovascular:  Antilipid - Fibric Acid Derivatives Failed - 03/12/2024 10:23 AM      Failed - ALT in normal range and within 360 days    ALT  Date Value Ref Range Status  06/23/2022 25 0 - 44 IU/L Final         Failed - AST in normal range and within 360 days    AST  Date Value Ref Range Status  06/23/2022 36 0 - 40 IU/L Final         Failed - Cr in normal range and within 360 days    Creatinine, Ser  Date Value Ref Range Status  06/23/2022 1.57 (H) 0.76 - 1.27 mg/dL Final         Failed - HGB in normal range and within 360 days    Hemoglobin  Date Value Ref Range Status  03/08/2022 12.7 (L) 13.0 - 17.7 g/dL Final         Failed - HCT in normal range and within 360 days    Hematocrit  Date Value Ref Range Status  03/08/2022 39.1 37.5 - 51.0 % Final         Failed - PLT in normal range and within 360 days    Platelets  Date Value Ref Range Status  03/08/2022 327 150 - 450 x10E3/uL Final         Failed - WBC in normal range and within 360 days    WBC  Date Value Ref Range Status  03/08/2022 6.9 3.4 - 10.8 x10E3/uL Final  02/25/2022 12.1 (H) 4.0 - 10.5 K/uL Final         Failed - eGFR is 30 or above and within 360 days    GFR calc Af Amer  Date Value Ref Range Status  09/18/2020 50 (L) >59 mL/min/1.73 Final    Comment:    **In accordance with recommendations from the NKF-ASN Task force,**   [facility] is in the process of updating its eGFR calculation to the   2021 CKD-EPI creatinine equation that estimates kidney function   without a  race variable.    GFR, Estimated  Date Value Ref Range Status  02/25/2022 52 (L) >60 mL/min Final    Comment:    (NOTE) Calculated using the CKD-EPI Creatinine Equation (2021)    eGFR  Date Value Ref Range Status  06/23/2022 43 (L) >59 mL/min/1.73 Final         Failed - Lipid Panel in normal range within the last 12 months    Cholesterol, Total  Date Value Ref Range Status  12/30/2023 193 100 - 199 mg/dL Final   LDL Chol Calc (NIH)  Date Value Ref Range Status  12/30/2023 120 (H) 0 - 99 mg/dL Final   HDL  Date Value Ref Range Status  12/30/2023 34 (L) >39 mg/dL Final   Triglycerides  Date Value Ref Range Status  12/30/2023 219 (H) 0 - 149 mg/dL Final         Passed - Valid encounter within  last 12 months    Recent Outpatient Visits           2 months ago Stage 3b chronic kidney disease (HCC)   [facility] [place], Lavelle Posey, NP       Future Appointments             In 3 months Cannady, Jolene T, NP [place] [facility]            Signed Prescriptions Disp Refills   levothyroxine  (SYNTHROID ) 25 MCG tablet 90 tablet 1    Sig: Take 1 tablet by mouth once daily     Endocrinology:  Hypothyroid Agents Passed - 03/12/2024 10:23 AM      Passed - TSH in normal range and within 360 days    TSH  Date Value Ref Range Status  12/30/2023 4.460 0.450 - 4.500 uIU/mL Final         Passed - Valid encounter within last 12 months    Recent Outpatient Visits           2 months ago Stage 3b chronic kidney disease (HCC)   [facility] [place], Lavelle Posey, NP       Future Appointments             In 3 months Cannady, Jolene T, NP [place] [facility]

## 2024-03-15 ENCOUNTER — Ambulatory Visit (INDEPENDENT_AMBULATORY_CARE_PROVIDER_SITE_OTHER): Payer: Self-pay | Admitting: Emergency Medicine

## 2024-03-15 VITALS — Ht 69.0 in | Wt 162.0 lb

## 2024-03-15 DIAGNOSIS — Z0000 Encounter for general adult medical examination without abnormal findings: Secondary | ICD-10-CM

## 2024-03-15 NOTE — Patient Instructions (Addendum)
 [name] , Thank you for taking time to come for your Medicare Wellness Visit. I appreciate your ongoing commitment to your health goals. Please review the following plan we discussed and let me know if I can assist you in the future.   Referrals/Orders/Follow-Ups/Clinician Recommendations: Discuss your hearing loss with Jolene Cannady, NP at your next office visit on 07/04/24.  This is a list of the screening recommended for you and due dates:  Health Maintenance  Topic Date Due   COVID-19 Vaccine (1) Never done   Colon Cancer Screening  12/12/2023   Zoster (Shingles) Vaccine (1 of 2) 03/23/2024*   Flu Shot  06/22/2024   Medicare Annual Wellness Visit  03/15/2025   DEXA scan (bone density measurement)  09/14/2025   DTaP/Tdap/Td vaccine (2 - Td or Tdap) 06/29/2026   HPV Vaccine  Aged Out   Meningitis B Vaccine  Aged Out   Pneumonia Vaccine  Discontinued  *Topic was postponed. The date shown is not the original due date.    Advanced directives: (Copy Requested) Please bring a copy of your health care power of attorney and living will to the office to be added to your chart at your convenience. You can mail to [facility] [address] [place], [place] [address] or email to [email or web address]  Next Medicare Annual Wellness Visit scheduled for next year: Yes, 03/28/25 @ 3:50pm (phone visit)

## 2024-03-15 NOTE — Progress Notes (Signed)
 Subjective:   Vincent Koch is a 89 y.o. who presents for a Medicare Wellness preventive visit.  Visit Complete: Virtual I connected with  Rondi Coder on 03/15/24 by a audio enabled telemedicine application and verified that I am speaking with the correct person using two identifiers.  Patient Location: Home  Provider Location: Home Office  I discussed the limitations of evaluation and management by telemedicine. The patient expressed understanding and agreed to proceed.  Vital Signs: Because this visit was a virtual/telehealth visit, some criteria may be missing or patient reported. Any vitals not documented were not able to be obtained and vitals that have been documented are patient reported.  VideoDeclined- This patient declined Librarian, academic. Therefore the visit was completed with audio only.  Persons Participating in Visit: Patient.  AWV Questionnaire: No: Patient Medicare AWV questionnaire was not completed prior to this visit.  Cardiac Risk Factors include: advanced age (>65men, >42 women);male gender;dyslipidemia     Objective:    Today's Vitals   03/15/24 1547  Weight: 162 lb (73.5 kg)  Height: 5\' 9"  (1.753 m)   Body mass index is 23.92 kg/m.     03/15/2024    4:01 PM 03/14/2023    3:35 PM 03/10/2022   12:31 PM 02/22/2022    1:49 PM 02/21/2022   10:34 PM 05/26/2021    3:20 PM 03/02/2021    9:03 AM  Advanced Directives  Does Patient Have a Medical Advance Directive? Yes No Yes Yes Yes Yes Yes  Type of Estate agent of [place];Living will  Living will Healthcare Power of Attorney Living will Healthcare Power of [place];Living will Healthcare Power of [place];Living will  Does patient want to make changes to medical advance directive? No - Patient declined   No - Patient declined     Copy of Healthcare Power of Attorney in Chart? No - copy requested  No - copy requested No - copy requested  No - copy  requested No - copy requested  Would patient like information on creating a medical advance directive?  No - Patient declined No - Patient declined  No - Patient declined      Current Medications (verified) Outpatient Encounter Medications as of 03/15/2024  Medication Sig   acetaminophen  (TYLENOL ) 500 MG tablet Take 500 mg by mouth every 6 (six) hours as needed.   aspirin EC 81 MG tablet Take 81 mg by mouth once.    calcium-vitamin D  (OSCAL WITH D) 500-200 MG-UNIT tablet Take 1 tablet by mouth.   fenofibrate  (TRICOR ) 145 MG tablet Take 1 tablet by mouth once daily   fexofenadine  (ALLEGRA ) 180 MG tablet Take 1 tablet (180 mg total) by mouth as needed for allergies.   levothyroxine  (SYNTHROID ) 25 MCG tablet Take 1 tablet by mouth once daily   Multiple Vitamin (MULTI-VITAMINS) TABS Take by mouth daily.    pantoprazole  (PROTONIX ) 40 MG tablet Take 1 tablet by mouth once daily   carbamide peroxide (DEBROX) 6.5 % OTIC solution Place 5 drops into the right ear 2 (two) times daily. (Patient not taking: Reported on 03/15/2024)   No facility-administered encounter medications on file as of 03/15/2024.    Allergies (verified) Patient has no known allergies.   History: Past Medical History:  Diagnosis Date   Bradycardia    Chronic kidney disease    Clostridium difficile diarrhea 03/30/2022   GERD (gastroesophageal reflux disease)    Hyperlipemia    Hypertension    Neuropathy  toes   Osteoporosis    Peripheral neuropathy    Thyroid  disease    Past Surgical History:  Procedure Laterality Date   CATARACT EXTRACTION W/PHACO Right 06/27/2019   Procedure: CATARACT EXTRACTION PHACO AND INTRAOCULAR LENS PLACEMENT (IOC)  RIGHT;  Surgeon: Annell Kidney, MD;  Location: [facility];  Service: Ophthalmology;  Laterality: Right;  VISION BLUE   COLONOSCOPY WITH PROPOFOL  N/A 12/11/2018   Procedure: COLONOSCOPY WITH PROPOFOL ;  Surgeon: Irby Mannan, MD;  Location: [facility];   Service: Endoscopy;  Laterality: N/A;   ESOPHAGEAL DILATION N/A 06/14/2016   Procedure: ESOPHAGOGASTRODUODENOSCOPY (EGD) WITH PROPOFOL  with dilation;  Surgeon: Marnee Sink, MD;  Location: [facility];  Service: Endoscopy;  Laterality: N/A;   ESOPHAGOGASTRODUODENOSCOPY (EGD) WITH PROPOFOL  N/A 07/15/2016   Procedure: ESOPHAGOGASTRODUODENOSCOPY (EGD) WITH DILATION;  Surgeon: Marnee Sink, MD;  Location: [facility];  Service: Endoscopy;  Laterality: N/A;   ESOPHAGOGASTRODUODENOSCOPY (EGD) WITH PROPOFOL  N/A 12/11/2018   Procedure: ESOPHAGOGASTRODUODENOSCOPY (EGD) WITH PROPOFOL ;  Surgeon: Irby Mannan, MD;  Location: [facility];  Service: Endoscopy;  Laterality: N/A;   HERNIA REPAIR     PARATHYROIDECTOMY     Family History  Problem Relation Age of Onset   Stroke Mother    Throat cancer Father    Social History   Socioeconomic History   Marital status: Married    Spouse name: Devra Fontana   Number of children: 3   Years of education: Not on file   Highest education level: Master's degree (e.g., MA, MS, MEng, MEd, MSW, MBA)  Occupational History   Occupation: retired  Tobacco Use   Smoking status: Never   Smokeless tobacco: Never  Vaping Use   Vaping status: Never Used  Substance and Sexual Activity   Alcohol use: Never   Drug use: Never   Sexual activity: Yes  Other Topics Concern   Not on file  Social History Narrative   3 daughters, 11 grandchildren and 36 great-grandchildren   Social Drivers of Corporate investment banker Strain: Low Risk  (03/15/2024)   Overall Financial Resource Strain (CARDIA)    Difficulty of Paying Living Expenses: Not hard at all  Food Insecurity: No Food Insecurity (03/15/2024)   Hunger Vital Sign    Worried About Running Out of Food in the Last Year: Never true    Ran Out of Food in the Last Year: Never true  Transportation Needs: No Transportation Needs (03/15/2024)   PRAPARE - Administrator, Civil Service  (Medical): No    Lack of Transportation (Non-Medical): No  Physical Activity: Insufficiently Active (03/15/2024)   Exercise Vital Sign    Days of Exercise per Week: 5 days    Minutes of Exercise per Session: 10 min  Stress: No Stress Concern Present (03/15/2024)   [employer] of Occupational Health - Occupational Stress Questionnaire    Feeling of Stress : Not at all  Social Connections: Moderately Integrated (03/15/2024)   Social Connection and Isolation Panel [NHANES]    Frequency of Communication with Friends and Family: More than three times a week    Frequency of Social Gatherings with Friends and Family: Three times a week    Attends Religious Services: More than 4 times per year    Active Member of Clubs or Organizations: No    Attends Banker Meetings: Never    Marital Status: Married    Tobacco Counseling Counseling given: Not Answered    Clinical Intake:  Pre-visit preparation completed: Yes  Pain : No/denies pain     BMI - recorded: 23.92 Nutritional Status: BMI of 19-24  Normal Nutritional Risks: None Diabetes: No  Lab Results  Component Value Date   HGBA1C 6.5 (H) 03/08/2022   HGBA1C 5.9 (H) 09/03/2021     How often do you need to have someone help you when you read instructions, pamphlets, or other written materials from your doctor or pharmacy?: 1 - Never  Interpreter Needed?: No  Information entered by :: Jaunita Messier, CMA   Activities of Daily Living     03/15/2024    3:51 PM  In your present state of health, do you have any difficulty performing the following activities:  Hearing? 1  Vision? 0  Difficulty concentrating or making decisions? 1  Comment memory loss  Walking or climbing stairs? 0  Dressing or bathing? 0  Doing errands, shopping? 0  Preparing Food and eating ? N  Using the Toilet? N  In the past six months, have you accidently leaked urine? Y  Comment occassional  Do you have problems with loss of bowel  control? N  Managing your Medications? N  Managing your Finances? N  Housekeeping or managing your Housekeeping? N    Patient Care Team: Cannady, Jolene T, NP as PCP - General (Nurse Practitioner) Annell Kidney, MD as Referring Physician (Ophthalmology) Worthy Heads, MD as Consulting Physician (Nephrology) Selena Daily, MD as Consulting Physician (Gastroenterology)  Indicate any recent Medical Services you may have received from other than [facility] providers in the past year (date may be approximate).     Assessment:   This is a routine wellness examination for Jkwon.  Hearing/Vision screen Hearing Screening - Comments:: Has hearing loss Vision Screening - Comments:: Gets eye exams, Dr. Jorje Newton Wilber   Goals Addressed               This Visit's Progress     Increase physical activity (pt-stated)         Depression Screen     03/15/2024    3:58 PM 12/30/2023    1:47 PM 06/28/2023    1:34 PM 03/14/2023    3:33 PM 06/22/2022    2:56 PM 04/20/2022    3:48 PM 03/30/2022    2:12 PM  PHQ 2/9 Scores  PHQ - 2 Score 0 0 0 0 0 0 0  PHQ- 9 Score 1 0 0 0 0 0 0    Fall Risk     03/15/2024    4:02 PM 06/28/2023    1:34 PM 03/14/2023    3:36 PM 06/22/2022    2:56 PM 04/20/2022    3:47 PM  Fall Risk   Falls in the past year? 0 0 0 0 0  Number falls in past yr: 0 0 0 0 0  Injury with Fall? 0 0 0 0 0  Risk for fall due to : No Fall Risks No Fall Risks No Fall Risks No Fall Risks No Fall Risks  Follow up Falls prevention discussed;Falls evaluation completed Falls evaluation completed Falls prevention discussed;Falls evaluation completed Falls evaluation completed Falls evaluation completed    MEDICARE RISK AT HOME:  Medicare Risk at Home Any stairs in or around the home?: Yes If so, are there any without handrails?: No Home free of loose throw rugs in walkways, pet beds, electrical cords, etc?: Yes Adequate lighting in your home to reduce risk of falls?:  Yes Life alert?: No Use of a cane, walker or w/c?: No Grab  bars in the bathroom?: No Shower chair or bench in shower?: No Elevated toilet seat or a handicapped toilet?: No  TIMED UP AND GO:  Was the test performed?  No  Cognitive Function: 6CIT completed        03/15/2024    4:03 PM 03/14/2023    3:39 PM 03/10/2022   12:32 PM 03/02/2021    9:08 AM 08/20/2019    2:51 PM  6CIT Screen  What Year? 4 points 0 points 0 points 0 points 0 points  What month? 0 points 0 points 0 points 0 points 0 points  What time? 0 points 3 points 0 points 0 points 0 points  Count back from 20 0 points 0 points 0 points 0 points 0 points  Months in reverse 0 points 0 points 2 points 0 points 0 points  Repeat phrase 2 points 0 points 2 points 4 points 0 points  Total Score 6 points 3 points 4 points 4 points 0 points    Immunizations Immunization History  Administered Date(s) Administered   Fluad Quad(high Dose 65+) 08/20/2019, 10/29/2020, 08/25/2021, 10/01/2022   Fluad Trivalent(High Dose 65+) 12/30/2023   Influenza, High Dose Seasonal PF 09/12/2017, 08/30/2018   Influenza,inj,Quad PF,6+ Mos 09/22/2015   Pneumococcal Conjugate-13 10/10/2014   Pneumococcal-Unspecified 11/02/2004   Tdap 06/29/2016   Zoster, Live 01/30/2007    Screening Tests Health Maintenance  Topic Date Due   COVID-19 Vaccine (1) Never done   Colonoscopy  12/12/2023   Zoster Vaccines- Shingrix (1 of 2) 03/23/2024 (Originally 07/03/1953)   INFLUENZA VACCINE  06/22/2024   Medicare Annual Wellness (AWV)  03/15/2025   DEXA SCAN  09/14/2025   DTaP/Tdap/Td (2 - Td or Tdap) 06/29/2026   HPV VACCINES  Aged Out   Meningococcal B Vaccine  Aged Out   Pneumonia Vaccine 45+ Years old  Discontinued    Health Maintenance  Health Maintenance Due  Topic Date Due   COVID-19 Vaccine (1) Never done   Colonoscopy  12/12/2023   Health Maintenance Items Addressed: See Nurse Notes  Additional Screening:  Vision Screening:  Recommended annual ophthalmology exams for early detection of glaucoma and other disorders of the eye.  Dental Screening: Recommended annual dental exams for proper oral hygiene  Community Resource Referral / Chronic Care Management: CRR required this visit?  No   CCM required this visit?  No     Plan:     I have personally reviewed and noted the following in the patient's chart:   Medical and social history Use of alcohol, tobacco or illicit drugs  Current medications and supplements including opioid prescriptions. Patient is not currently taking opioid prescriptions. Functional ability and status Nutritional status Physical activity Advanced directives List of other physicians Hospitalizations, surgeries, and ER visits in previous 12 months Vitals Screenings to include cognitive, depression, and falls Referrals and appointments  In addition, I have reviewed and discussed with patient certain preventive protocols, quality metrics, and best practice recommendations. A written personalized care plan for preventive services as well as general preventive health recommendations were provided to patient.     Jaunita Messier, CMA   03/15/2024   After Visit Summary: (MyChart) Due to this being a telephonic visit, the after visit summary with patients personalized plan was offered to patient via MyChart   Notes:  6 CIT Score - 6 Declined shingles and covid vaccines Last colon 12/11/18 repeat 5 yrs. Patient declined colonoscopy

## 2024-07-04 ENCOUNTER — Ambulatory Visit (INDEPENDENT_AMBULATORY_CARE_PROVIDER_SITE_OTHER): Payer: Medicare Other | Admitting: Nurse Practitioner

## 2024-07-04 ENCOUNTER — Encounter: Payer: Self-pay | Admitting: Nurse Practitioner

## 2024-07-04 VITALS — BP 118/63 | HR 58 | Temp 98.7°F | Ht 68.1 in | Wt 165.0 lb

## 2024-07-04 DIAGNOSIS — N1832 Chronic kidney disease, stage 3b: Secondary | ICD-10-CM | POA: Diagnosis not present

## 2024-07-04 DIAGNOSIS — E039 Hypothyroidism, unspecified: Secondary | ICD-10-CM | POA: Diagnosis not present

## 2024-07-04 DIAGNOSIS — D692 Other nonthrombocytopenic purpura: Secondary | ICD-10-CM

## 2024-07-04 DIAGNOSIS — G609 Hereditary and idiopathic neuropathy, unspecified: Secondary | ICD-10-CM

## 2024-07-04 DIAGNOSIS — E785 Hyperlipidemia, unspecified: Secondary | ICD-10-CM | POA: Diagnosis not present

## 2024-07-04 DIAGNOSIS — M810 Age-related osteoporosis without current pathological fracture: Secondary | ICD-10-CM

## 2024-07-04 LAB — MICROALBUMIN, URINE WAIVED
Creatinine, Urine Waived: 200 mg/dL (ref 10–300)
Microalb, Ur Waived: 80 mg/L — ABNORMAL HIGH (ref 0–19)

## 2024-07-04 MED ORDER — LEVOTHYROXINE SODIUM 25 MCG PO TABS: 25.0000 ug | ORAL_TABLET | Freq: Every day | ORAL | 4 refills | Status: AC

## 2024-07-04 NOTE — Assessment & Plan Note (Signed)
Ongoing.  Continues on daily ASA. Recommend use of gentle skin cleansers at home and monitor for wounds, if present notify provider.

## 2024-07-04 NOTE — Progress Notes (Signed)
 BP 118/63   Pulse (!) 58   Temp 98.7 F (37.1 C) (Oral)   Ht 5' 8.1 (1.73 m)   Wt 165 lb (74.8 kg)   SpO2 96%   BMI 25.01 kg/m    Subjective:    Patient ID: Vincent Koch, male    DOB: December 29, 1933, 89 y.o.   MRN: [id number]  HPI: Vincent Koch is a 89 y.o. male  Chief Complaint  Patient presents with   Hyperlipidemia   Hypothyroidism   Wife at bedside with him.     HYPOTHYROIDISM Continues Levothyroxine  25 MCG.    Followed with endo in past for osteoporosis, history of parathyroidectomy in 2013.  Recent DEXA 09/15/23, showed osteopenia, T-score -2.2.  No recent falls or fractures. Thyroid  control status:stable Satisfied with current treatment? yes Medication side effects: no Medication compliance: good compliance Etiology of hypothyroidism: unknown Recent dose adjustment:no Fatigue: no Cold intolerance: no Heat intolerance: yes Weight gain: no Weight loss: no Constipation: no Diarrhea/loose stools: no Palpitations: no Lower extremity edema: no Anxiety/depressed mood: no   HYPERLIPIDEMIA Taking Fenofibrate  daily. Hyperlipidemia status: good compliance Satisfied with current treatment?  yes Side effects:  no Medication compliance: good compliance Supplements: none Aspirin:  yes The ASCVD Risk score (Arnett DK, et al., 2019) failed to calculate for the following reasons:   The 2019 ASCVD risk score is only valid for ages 52 to 61 Chest pain:  no Coronary artery disease:  no Family history CAD:  yes - mother stroke Family history early CAD:  no   OSTEOPOROSIS Plan on repeat DEXA next year. No recent falls or fractures. Satisfied with current treatment?: yes Past osteoporosis medications/treatments: Fosamax Adequate calcium & vitamin D : yes Intolerance to bisphosphonates:no Weight bearing exercises: yes   CHRONIC KIDNEY DISEASE Saw nephrology in March -- CRT 1.81, eGFR 36, PTH 33.  CKD status: stable Medications renally dose: yes Previous  renal evaluation: yes Pneumovax:  Up to Date Influenza Vaccine:  Up to Date      07/04/2024    2:25 PM 03/15/2024    3:58 PM 12/30/2023    1:47 PM 06/28/2023    1:34 PM 03/14/2023    3:33 PM  Depression screen PHQ 2/9  Decreased Interest 0 0 0 0 0  Down, Depressed, Hopeless 0 0 0 0 0  PHQ - 2 Score 0 0 0 0 0  Altered sleeping 0 1 0 0 0  Tired, decreased energy 0 0 0 0 0  Change in appetite 0 0 0 0 0  Feeling bad or failure about yourself  0 0 0 0 0  Trouble concentrating 0 0 0 0 0  Moving slowly or fidgety/restless 0 0 0 0 0  Suicidal thoughts 0 0 0 0 0  PHQ-9 Score 0 1 0 0 0  Difficult doing work/chores Not difficult at all Not difficult at all  Not difficult at all Not difficult at all       07/04/2024    2:25 PM 12/30/2023    1:47 PM 06/28/2023    1:36 PM 06/22/2022    2:56 PM  GAD 7 : Generalized Anxiety Score  Nervous, Anxious, on Edge 0 0 0 0  Control/stop worrying 0 0 0 0  Worry too much - different things 0 0 0 0  Trouble relaxing 0 0 0 0  Restless 0 0 0 0  Easily annoyed or irritable 0 0 0 0  Afraid - awful might happen 0 0 0 0  Total  GAD 7 Score 0 0 0 0  Anxiety Difficulty Not difficult at all  Not difficult at all Not difficult at all      Relevant past medical, surgical, family and social history reviewed and updated as indicated. Interim medical history since our last visit reviewed. Allergies and medications reviewed and updated.  Review of Systems  Constitutional:  Negative for activity change, diaphoresis, fatigue and fever.  Respiratory:  Negative for cough, chest tightness, shortness of breath and wheezing.   Cardiovascular:  Negative for chest pain, palpitations and leg swelling.  Gastrointestinal: Negative.   Endocrine: Negative for cold intolerance and heat intolerance.  Neurological: Negative.   Psychiatric/Behavioral: Negative.      Per HPI unless specifically indicated above     Objective:    BP 118/63   Pulse (!) 58   Temp 98.7 F (37.1 C)  (Oral)   Ht 5' 8.1 (1.73 m)   Wt 165 lb (74.8 kg)   SpO2 96%   BMI 25.01 kg/m   Wt Readings from Last 3 Encounters:  07/04/24 165 lb (74.8 kg)  03/15/24 162 lb (73.5 kg)  12/30/23 166 lb (75.3 kg)    Physical Exam Vitals and nursing note reviewed.  Constitutional:      General: He is awake. He is not in acute distress.    Appearance: He is well-developed and well-groomed. He is not ill-appearing or toxic-appearing.  HENT:     Head: Normocephalic.     Right Ear: Hearing and external ear normal.     Left Ear: Hearing and external ear normal.  Eyes:     General: Lids are normal.     Extraocular Movements: Extraocular movements intact.     Conjunctiva/sclera: Conjunctivae normal.  Neck:     Thyroid : No thyromegaly.     Vascular: No carotid bruit.  Cardiovascular:     Rate and Rhythm: Normal rate and regular rhythm.     Heart sounds: Normal heart sounds.  Pulmonary:     Effort: No accessory muscle usage or respiratory distress.     Breath sounds: Normal breath sounds.  Abdominal:     General: Bowel sounds are normal. There is no distension.     Palpations: Abdomen is soft.     Tenderness: There is no abdominal tenderness.  Musculoskeletal:     Cervical back: Full passive range of motion without pain.     Right lower leg: No edema.     Left lower leg: No edema.  Lymphadenopathy:     Cervical: No cervical adenopathy.  Skin:    General: Skin is warm.     Capillary Refill: Capillary refill takes less than 2 seconds.     Findings: Bruising present.     Comments: Scattered bruising to upper extremities.  Neurological:     Mental Status: He is alert and oriented to person, place, and time.     Deep Tendon Reflexes: Reflexes are normal and symmetric.     Reflex Scores:      Brachioradialis reflexes are 2+ on the right side and 2+ on the left side.      Patellar reflexes are 2+ on the right side and 2+ on the left side. Psychiatric:        Attention and Perception: Attention  normal.        Mood and Affect: Mood normal.        Speech: Speech normal.        Behavior: Behavior normal. Behavior is cooperative.  Thought Content: Thought content normal.    Results for orders placed or performed in visit on 12/30/23  T4, free   Collection Time: 12/30/23  2:06 PM  Result Value Ref Range   Free T4 1.01 0.82 - 1.77 ng/dL  Lipid Panel w/o Chol/HDL Ratio   Collection Time: 12/30/23  2:06 PM  Result Value Ref Range   Cholesterol, Total 193 100 - 199 mg/dL   Triglycerides 780 (H) 0 - 149 mg/dL   HDL 34 (L) >60 mg/dL   VLDL Cholesterol Cal 39 5 - 40 mg/dL   LDL Chol Calc (NIH) 879 (H) 0 - 99 mg/dL  TSH   Collection Time: 12/30/23  2:06 PM  Result Value Ref Range   TSH 4.460 0.450 - 4.500 uIU/mL      Assessment & Plan:   Problem List Items Addressed This Visit       Cardiovascular and Mediastinum   Purpura senilis (HCC)   Ongoing.  Continues on daily ASA. Recommend use of gentle skin cleansers at home and monitor for wounds, if present notify provider.        Endocrine   Hypothyroidism   Chronic, ongoing.  Continue current medication regimen and adjust as needed based on labs.  Labs today.      Relevant Medications   levothyroxine  (SYNTHROID ) 25 MCG tablet   Other Relevant Orders   TSH   T4, free     Musculoskeletal and Integument   Senile osteoporosis   Stable with recent DEXA improved.  Saw endo in past. May need to return to endo in future if IV treatment needed as they discussed their last visit with them.  Could also consider Prolia.  Continue Vit D daily. Plan on repeat DEXA in 2026.        Genitourinary   CKD (chronic kidney disease) stage 3, GFR 30-59 ml/min (HCC) - Primary   Chronic, ongoing.  Will continue collaboration with nephrology, recent notes and labs reviewed.  Appreciate their input. Will check CMP and urine ALB + CBC today for upcoming nephrology visit.      Relevant Orders   Microalbumin, Urine Waived   CBC with  Differential/Platelet   Comprehensive metabolic panel with GFR     Other   Dyslipidemia   Chronic, ongoing.  Continue current medication regimen and adjust as needed.  Lipid panel today.      Relevant Orders   Comprehensive metabolic panel with GFR   Lipid Panel w/o Chol/HDL Ratio     Follow up plan: Return in about 6 months (around 01/04/2025) for CKD, THYROID , OSTEOPOROSIS.

## 2024-07-04 NOTE — Patient Instructions (Signed)
 Chronic Kidney Disease: Eating Plan Chronic kidney disease (CKD) is when your kidneys aren't working well. They can't remove waste, fluids, and other substances from your blood. When these substances build up, they can worsen kidney damage and affect your health. Eating certain foods can lead to a buildup of these substances. Changing your diet can help prevent more kidney damage. Diet changes may also delay dialysis or even keep you from needing it. What nutrients should I limit? Work with your health care team and an expert in healthy eating (dietitian) to make a meal plan that's right for you. Foods you can eat and foods you should limit or avoid will depend on: The stage of your kidney disease. Any other conditions you have. The items listed below are not a complete list. Talk with a dietitian to learn what's best for you. Potassium Potassium affects how well your heart beats. Too much potassium in your blood can cause an irregular heartbeat or even a heart attack. You may need to limit foods that are high in potassium, such as: Liquid milk and soy milk. Salt substitutes that contain potassium. Fruits like: Bananas. Apricots. Melon. Prunes and raisins. Kiwi. Nectarines and oranges. Vegetables, such as: Potatoes, sweet potatoes, and yams. Tomatoes. Leafy greens. Beets. Avocado. Pumpkin and winter squash. Beans, like lima beans. Nuts. Phosphorus Phosphorus is a mineral found in your bones. You need a balance between calcium and phosphorus to build and maintain healthy bones.  Too much added phosphorus from the foods you eat can pull calcium from your bones. Losing calcium can make your bones weak and more likely to break. Too much phosphorus can also make your skin itch. You may need to limit foods that are high in phosphorus or that have added phosphorus, such as: Liquid milk and dairy products. Dark-colored sodas or soft drinks. Bran cereals and oatmeal. Protein  Protein  helps your body make and keep muscle. Protein also helps to repair your body's cells and tissues.  One of the natural breakdown products of protein is a waste product called urea. When your kidneys aren't working well, they can't remove waste like urea. Reducing protein in your diet can help keep urea from building up in your blood. Depending on your stage of kidney disease, you may need to eat smaller portions of foods that are high in protein. Sources of animal protein include: Meat (all types). Fish and seafood. Poultry. Eggs. Dairy. Other protein foods include: Beans and legumes. Nuts and nut butter. Soy, like tofu.  Sodium Salt (sodium) helps to keep a healthy balance of fluids in your body. Too much salt can increase your blood pressure, which can harm your heart and lungs. Extra salt can also cause your body to keep too much fluid, making your kidneys work harder. You may need to limit or avoid foods that are high in salt, such as: Salt seasonings. Soy and teriyaki sauce. Meats that are: Packaged. Precooked. Cured. Processed. Salted crackers and snack foods. Fast food. Canned soups and foods. Pickled foods. Boxed mixes or ready-to-eat boxed meals and side dishes. Bottled dressings, sauces, and marinades. Talk with your dietitian about how much potassium, phosphorus, protein, and salt you may have each day. What are tips for following this plan? Reading food labels  Check the amount of salt in foods. Limit foods that have salt listed among the first five ingredients. Try to eat low-salt foods. Check the ingredient list for added phosphorus or potassium. "Phos" in an ingredient is a sign that  phosphorus has been added. Do not buy foods that are calcium-enriched or that have calcium added to them (fortified). Buy canned vegetables and beans that say "no salt added." Rinse them before eating. Lifestyle Limit the amount of protein you eat from animal sources each day. Focus on  protein from plant sources, like tofu and dried beans, peas, and lentils. Do not add salt to food when cooking or before eating. Do not eat star fruit. It can be toxic for people with kidney problems. Talk with your health care provider before taking any vitamin or mineral supplements. If told by your provider: Track how much liquid you have so you can avoid drinking too much. Try to eat foods that are made mostly from water, like gelatin, ice cream, soups, and juicy fruits and vegetables. If you have diabetes and chronic kidney disease: If you have diabetes and CKD, you need to keep your blood sugar (glucose) in the target range recommended by your provider. Follow your diabetes management plan. This may include: Checking your blood glucose regularly. Taking medicines by mouth, or taking insulin, or both. Exercising for at least 30 minutes on 5 or more days each week, or as told by your provider. Tracking how many servings of carbohydrates you eat at each meal. Not using orange juice to treat low blood sugars. Instead, use apple juice, cranberry juice, or clear soda. You may be given guidelines on what foods and nutrients you may eat, and how much you can have each day. This depends on your stage of kidney disease and whether you have high blood pressure. Follow the meal plan your dietitian gives you. Where to find more information [employer] Diabetes and Digestive and Kidney Diseases: [employer]Sync.si National Kidney Foundation: kidney.org This information is not intended to replace advice given to you by your health care provider. Make sure you discuss any questions you have with your health care provider. Document Revised: 06/21/2023 Document Reviewed: 06/21/2023 Elsevier Patient Education  2024 [employer].

## 2024-07-04 NOTE — Assessment & Plan Note (Signed)
 Chronic, ongoing.  Continue current medication regimen and adjust as needed. Lipid panel today.

## 2024-07-04 NOTE — Assessment & Plan Note (Signed)
 Stable with recent DEXA improved.  Saw endo in past. May need to return to endo in future if IV treatment needed as they discussed their last visit with them.  Could also consider Prolia.  Continue Vit D daily. Plan on repeat DEXA in 2026.

## 2024-07-04 NOTE — Assessment & Plan Note (Signed)
Chronic, ongoing.  Continue current medication regimen and adjust as needed based on labs. Labs today.

## 2024-07-04 NOTE — Assessment & Plan Note (Signed)
 Chronic, ongoing.  Will continue collaboration with nephrology, recent notes and labs reviewed.  Appreciate their input. Will check CMP and urine ALB + CBC today for upcoming nephrology visit.

## 2024-07-05 ENCOUNTER — Ambulatory Visit: Payer: Self-pay | Admitting: Nurse Practitioner

## 2024-07-05 LAB — CBC WITH DIFFERENTIAL/PLATELET
Basophils Absolute: 0.1 x10E3/uL (ref 0.0–0.2)
Basos: 1 %
EOS (ABSOLUTE): 0.3 x10E3/uL (ref 0.0–0.4)
Eos: 5 %
Hematocrit: 40.2 % (ref 37.5–51.0)
Hemoglobin: 12.8 g/dL — ABNORMAL LOW (ref 13.0–17.7)
Immature Grans (Abs): 0 x10E3/uL (ref 0.0–0.1)
Immature Granulocytes: 0 %
Lymphocytes Absolute: 1.5 x10E3/uL (ref 0.7–3.1)
Lymphs: 25 %
MCH: 30.8 pg (ref 26.6–33.0)
MCHC: 31.8 g/dL (ref 31.5–35.7)
MCV: 97 fL (ref 79–97)
Monocytes Absolute: 0.6 x10E3/uL (ref 0.1–0.9)
Monocytes: 10 %
Neutrophils Absolute: 3.5 x10E3/uL (ref 1.4–7.0)
Neutrophils: 58 %
Platelets: 257 x10E3/uL (ref 150–450)
RBC: 4.16 x10E6/uL (ref 4.14–5.80)
RDW: 13.4 % (ref 11.6–15.4)
WBC: 6 x10E3/uL (ref 3.4–10.8)

## 2024-07-05 LAB — COMPREHENSIVE METABOLIC PANEL WITH GFR
ALT: 32 IU/L (ref 0–44)
AST: 41 IU/L — ABNORMAL HIGH (ref 0–40)
Albumin: 4.3 g/dL (ref 3.7–4.7)
Alkaline Phosphatase: 64 IU/L (ref 44–121)
BUN/Creatinine Ratio: 13 (ref 10–24)
BUN: 23 mg/dL (ref 8–27)
Bilirubin Total: 0.4 mg/dL (ref 0.0–1.2)
CO2: 19 mmol/L — ABNORMAL LOW (ref 20–29)
Calcium: 9.3 mg/dL (ref 8.6–10.2)
Chloride: 100 mmol/L (ref 96–106)
Creatinine, Ser: 1.77 mg/dL — ABNORMAL HIGH (ref 0.76–1.27)
Globulin, Total: 2.1 g/dL (ref 1.5–4.5)
Glucose: 185 mg/dL — ABNORMAL HIGH (ref 70–99)
Potassium: 4.1 mmol/L (ref 3.5–5.2)
Sodium: 135 mmol/L (ref 134–144)
Total Protein: 6.4 g/dL (ref 6.0–8.5)
eGFR: 36 mL/min/1.73 — ABNORMAL LOW (ref >59)

## 2024-07-05 LAB — LIPID PANEL W/O CHOL/HDL RATIO
Cholesterol, Total: 178 mg/dL (ref 100–199)
HDL: 29 mg/dL — ABNORMAL LOW (ref >39)
LDL Chol Calc (NIH): 105 mg/dL — ABNORMAL HIGH (ref 0–99)
Triglycerides: 256 mg/dL — ABNORMAL HIGH (ref 0–149)
VLDL Cholesterol Cal: 44 mg/dL — ABNORMAL HIGH (ref 5–40)

## 2024-07-05 LAB — T4, FREE: Free T4: 0.89 ng/dL (ref 0.82–1.77)

## 2024-07-05 LAB — TSH: TSH: 2.69 u[IU]/mL (ref 0.450–4.500)

## 2024-07-05 NOTE — Progress Notes (Signed)
 Contacted via MyChart, but please call to ensure he did read as does not always check MyChart.  Thank you:) Good day Vincent Koch, your labs have returned: - CBC continues to show very midly low hemoglobin but remainder all looks good, so we can continue to monitor this at visit. You may have some very mild anemia from your kidney disease. - Kidney function continues to show Stage 3b kidney disease, continue visits with the kidney doctor and get good hydration daily. - Your glucose was really elevated, did you eat before visit?  If not I would like to recheck this in one week. - Thyroid  labs are stable, continue current Levothyroxine  dose. - Cholesterol levels remain a bit elevated, continue Fenofibrate . Any questions? Keep being stellar!!  Thank you for allowing me to participate in your care.  I appreciate you. Kindest regards, Keonia Pasko

## 2024-09-09 ENCOUNTER — Other Ambulatory Visit: Payer: Self-pay | Admitting: Nurse Practitioner

## 2024-09-11 NOTE — Telephone Encounter (Signed)
 Requested Prescriptions  Pending Prescriptions Disp Refills   pantoprazole  (PROTONIX ) 40 MG tablet [Pharmacy Med Name: Pantoprazole  Sodium 40 MG Oral Tablet Delayed Release] 90 tablet 1    Sig: Take 1 tablet by mouth once daily     Gastroenterology: Proton Pump Inhibitors Passed - 09/11/2024 12:17 PM      Passed - Valid encounter within last 12 months    Recent Outpatient Visits           2 months ago Stage 3b chronic kidney disease (HCC)   [place] [facility] [place], Jolene T, NP   8 months ago Stage 3b chronic kidney disease (HCC)   [place] [facility] [place], Melanie DASEN, NP

## 2024-10-30 ENCOUNTER — Ambulatory Visit

## 2024-12-08 ENCOUNTER — Other Ambulatory Visit: Payer: Self-pay | Admitting: Nurse Practitioner

## 2024-12-10 NOTE — Telephone Encounter (Signed)
 Requested Prescriptions  Pending Prescriptions Disp Refills   fenofibrate  (TRICOR ) 145 MG tablet [Pharmacy Med Name: Fenofibrate  145 MG Oral Tablet] 90 tablet 0    Sig: Take 1 tablet by mouth once daily     Cardiovascular:  Antilipid - Fibric Acid Derivatives Failed - 12/10/2024 10:25 AM      Failed - AST in normal range and within 360 days    AST  Date Value Ref Range Status  07/04/2024 41 (H) 0 - 40 IU/L Final         Failed - Cr in normal range and within 360 days    Creatinine, Ser  Date Value Ref Range Status  07/04/2024 1.77 (H) 0.76 - 1.27 mg/dL Final         Failed - HGB in normal range and within 360 days    Hemoglobin  Date Value Ref Range Status  07/04/2024 12.8 (L) 13.0 - 17.7 g/dL Final         Failed - Lipid Panel in normal range within the last 12 months    Cholesterol, Total  Date Value Ref Range Status  07/04/2024 178 100 - 199 mg/dL Final   LDL Chol Calc (NIH)  Date Value Ref Range Status  07/04/2024 105 (H) 0 - 99 mg/dL Final   HDL  Date Value Ref Range Status  07/04/2024 29 (L) >39 mg/dL Final   Triglycerides  Date Value Ref Range Status  07/04/2024 256 (H) 0 - 149 mg/dL Final         Passed - ALT in normal range and within 360 days    ALT  Date Value Ref Range Status  07/04/2024 32 0 - 44 IU/L Final         Passed - HCT in normal range and within 360 days    Hematocrit  Date Value Ref Range Status  07/04/2024 40.2 37.5 - 51.0 % Final         Passed - PLT in normal range and within 360 days    Platelets  Date Value Ref Range Status  07/04/2024 257 150 - 450 x10E3/uL Final         Passed - WBC in normal range and within 360 days    WBC  Date Value Ref Range Status  07/04/2024 6.0 3.4 - 10.8 x10E3/uL Final  02/25/2022 12.1 (H) 4.0 - 10.5 K/uL Final         Passed - eGFR is 30 or above and within 360 days    GFR calc Af Amer  Date Value Ref Range Status  09/18/2020 50 (L) >59 mL/min/1.73 Final    Comment:    **In accordance  with recommendations from the NKF-ASN Task force,**   [facility] is in the process of updating its eGFR calculation to the   2021 CKD-EPI creatinine equation that estimates kidney function   without a race variable.    GFR, Estimated  Date Value Ref Range Status  02/25/2022 52 (L) >60 mL/min Final    Comment:    (NOTE) Calculated using the CKD-EPI Creatinine Equation (2021)    eGFR  Date Value Ref Range Status  07/04/2024 36 (L) >59 mL/min/1.73 Final         Passed - Valid encounter within last 12 months    Recent Outpatient Visits           5 months ago Stage 3b chronic kidney disease (HCC)   [place] [facility] [place], Melanie T, NP   11 months  ago Stage 3b chronic kidney disease [facility])   [place] [facility] [place], Melanie DASEN, NP

## 2025-01-07 ENCOUNTER — Ambulatory Visit: Admitting: Nurse Practitioner

## 2025-03-28 ENCOUNTER — Ambulatory Visit
